# Patient Record
Sex: Female | Born: 1955
Health system: Southern US, Community
[De-identification: ages and names within clinical notes are randomized; demographics above are authoritative.]

## PROBLEM LIST (undated history)

## (undated) DIAGNOSIS — C229 Malignant neoplasm of liver, not specified as primary or secondary: Secondary | ICD-10-CM

## (undated) DIAGNOSIS — E119 Type 2 diabetes mellitus without complications: Secondary | ICD-10-CM

## (undated) DIAGNOSIS — I10 Essential (primary) hypertension: Secondary | ICD-10-CM

## (undated) DIAGNOSIS — K2210 Ulcer of esophagus without bleeding: Secondary | ICD-10-CM

## (undated) DIAGNOSIS — C23 Malignant neoplasm of gallbladder: Secondary | ICD-10-CM

## (undated) DIAGNOSIS — I2510 Atherosclerotic heart disease of native coronary artery without angina pectoris: Secondary | ICD-10-CM

## (undated) DIAGNOSIS — K269 Duodenal ulcer, unspecified as acute or chronic, without hemorrhage or perforation: Secondary | ICD-10-CM

## (undated) DIAGNOSIS — E785 Hyperlipidemia, unspecified: Secondary | ICD-10-CM

## (undated) DIAGNOSIS — D219 Benign neoplasm of connective and other soft tissue, unspecified: Secondary | ICD-10-CM

## (undated) HISTORY — DX: Atherosclerotic heart disease of native coronary artery without angina pectoris: I25.10

## (undated) HISTORY — DX: Essential (primary) hypertension: I10

## (undated) HISTORY — PX: MOUTH SURGERY: SHX715

## (undated) HISTORY — DX: Benign neoplasm of connective and other soft tissue, unspecified: D21.9

## (undated) HISTORY — DX: Hyperlipidemia, unspecified: E78.5

---

## 2000-07-23 ENCOUNTER — Encounter: Admission: RE | Admit: 2000-07-23 | Discharge: 2000-10-21 | Payer: Self-pay | Admitting: Internal Medicine

## 2000-07-27 ENCOUNTER — Encounter: Payer: Self-pay | Admitting: Emergency Medicine

## 2000-07-28 ENCOUNTER — Encounter: Payer: Self-pay | Admitting: Internal Medicine

## 2000-07-28 ENCOUNTER — Inpatient Hospital Stay (HOSPITAL_COMMUNITY): Admission: EM | Admit: 2000-07-28 | Discharge: 2000-08-01 | Payer: Self-pay | Admitting: Emergency Medicine

## 2000-07-30 ENCOUNTER — Encounter: Payer: Self-pay | Admitting: Internal Medicine

## 2001-07-18 ENCOUNTER — Encounter: Admission: RE | Admit: 2001-07-18 | Discharge: 2001-07-18 | Payer: Self-pay | Admitting: Internal Medicine

## 2001-07-18 ENCOUNTER — Encounter: Payer: Self-pay | Admitting: Internal Medicine

## 2001-09-10 ENCOUNTER — Ambulatory Visit (HOSPITAL_COMMUNITY): Admission: RE | Admit: 2001-09-10 | Discharge: 2001-09-10 | Payer: Self-pay | Admitting: Obstetrics and Gynecology

## 2001-09-10 ENCOUNTER — Encounter: Payer: Self-pay | Admitting: Obstetrics and Gynecology

## 2001-10-15 ENCOUNTER — Encounter: Payer: Self-pay | Admitting: Obstetrics and Gynecology

## 2001-10-15 ENCOUNTER — Ambulatory Visit (HOSPITAL_COMMUNITY): Admission: RE | Admit: 2001-10-15 | Discharge: 2001-10-15 | Payer: Self-pay | Admitting: Obstetrics and Gynecology

## 2003-03-24 ENCOUNTER — Other Ambulatory Visit: Admission: RE | Admit: 2003-03-24 | Discharge: 2003-03-24 | Payer: Self-pay | Admitting: Obstetrics and Gynecology

## 2003-03-31 ENCOUNTER — Ambulatory Visit (HOSPITAL_COMMUNITY): Admission: RE | Admit: 2003-03-31 | Discharge: 2003-03-31 | Payer: Self-pay | Admitting: Obstetrics and Gynecology

## 2003-03-31 ENCOUNTER — Encounter: Payer: Self-pay | Admitting: Obstetrics and Gynecology

## 2003-07-01 ENCOUNTER — Ambulatory Visit (HOSPITAL_COMMUNITY): Admission: RE | Admit: 2003-07-01 | Discharge: 2003-07-01 | Payer: Self-pay | Admitting: Obstetrics and Gynecology

## 2003-07-01 ENCOUNTER — Encounter: Payer: Self-pay | Admitting: Obstetrics and Gynecology

## 2004-04-13 ENCOUNTER — Ambulatory Visit (HOSPITAL_COMMUNITY): Admission: RE | Admit: 2004-04-13 | Discharge: 2004-04-13 | Payer: Self-pay | Admitting: Obstetrics and Gynecology

## 2004-05-05 ENCOUNTER — Other Ambulatory Visit: Admission: RE | Admit: 2004-05-05 | Discharge: 2004-05-05 | Payer: Self-pay | Admitting: Obstetrics and Gynecology

## 2004-12-14 ENCOUNTER — Ambulatory Visit (HOSPITAL_COMMUNITY): Admission: RE | Admit: 2004-12-14 | Discharge: 2004-12-14 | Payer: Self-pay | Admitting: Gastroenterology

## 2005-06-07 ENCOUNTER — Other Ambulatory Visit: Admission: RE | Admit: 2005-06-07 | Discharge: 2005-06-07 | Payer: Self-pay | Admitting: Obstetrics and Gynecology

## 2005-06-21 ENCOUNTER — Ambulatory Visit (HOSPITAL_COMMUNITY): Admission: RE | Admit: 2005-06-21 | Discharge: 2005-06-21 | Payer: Self-pay | Admitting: Obstetrics and Gynecology

## 2006-07-10 ENCOUNTER — Encounter: Admission: RE | Admit: 2006-07-10 | Discharge: 2006-07-10 | Payer: Self-pay | Admitting: Internal Medicine

## 2006-09-06 ENCOUNTER — Other Ambulatory Visit: Admission: RE | Admit: 2006-09-06 | Discharge: 2006-09-06 | Payer: Self-pay | Admitting: Obstetrics and Gynecology

## 2007-02-28 HISTORY — PX: CARDIOVASCULAR STRESS TEST: SHX262

## 2007-04-05 ENCOUNTER — Ambulatory Visit (HOSPITAL_COMMUNITY): Admission: RE | Admit: 2007-04-05 | Discharge: 2007-04-05 | Payer: Self-pay | Admitting: Cardiology

## 2007-04-05 HISTORY — PX: CARDIAC CATHETERIZATION: SHX172

## 2007-10-03 ENCOUNTER — Other Ambulatory Visit: Admission: RE | Admit: 2007-10-03 | Discharge: 2007-10-03 | Payer: Self-pay | Admitting: Obstetrics and Gynecology

## 2007-10-29 ENCOUNTER — Ambulatory Visit (HOSPITAL_COMMUNITY): Admission: RE | Admit: 2007-10-29 | Discharge: 2007-10-29 | Payer: Self-pay | Admitting: Obstetrics and Gynecology

## 2008-04-01 ENCOUNTER — Other Ambulatory Visit: Admission: RE | Admit: 2008-04-01 | Discharge: 2008-04-01 | Payer: Self-pay | Admitting: Obstetrics and Gynecology

## 2008-10-21 ENCOUNTER — Other Ambulatory Visit: Admission: RE | Admit: 2008-10-21 | Discharge: 2008-10-21 | Payer: Self-pay | Admitting: Obstetrics and Gynecology

## 2008-11-11 ENCOUNTER — Ambulatory Visit (HOSPITAL_COMMUNITY): Admission: RE | Admit: 2008-11-11 | Discharge: 2008-11-11 | Payer: Self-pay | Admitting: Obstetrics and Gynecology

## 2009-04-21 ENCOUNTER — Other Ambulatory Visit: Admission: RE | Admit: 2009-04-21 | Discharge: 2009-04-21 | Payer: Self-pay | Admitting: Obstetrics and Gynecology

## 2010-07-27 ENCOUNTER — Encounter: Admission: RE | Admit: 2010-07-27 | Discharge: 2010-07-27 | Payer: Self-pay | Admitting: Internal Medicine

## 2010-10-02 ENCOUNTER — Encounter: Payer: Self-pay | Admitting: Obstetrics and Gynecology

## 2011-01-24 NOTE — Cardiovascular Report (Signed)
[name], [name] NO.:  [id number]   MEDICAL RECORD NO.:  [id number]          PATIENT TYPE:  OIB   LOCATION:  2899                         FACILITY:  [facility]   PHYSICIAN:  Peter M. Swaziland, M.D.  DATE OF BIRTH:  [birth year]   DATE OF PROCEDURE:  04/05/2007  DATE OF DISCHARGE:  04/05/2007                            CARDIAC CATHETERIZATION   INDICATIONS FOR PROCEDURE:  A patient is a 55 year old black female with  history of diabetes, hypercholesterolemia, remote tobacco use who had  prior cardiac catheterization in 2001.  She had a recent Adenosine  Cardiolite study which showed new area of ischemia in the distal  anterolateral wall.  Cardiac catheterization was recommended.   PROCEDURE:  1. Left heart catheterization.  2. Coronary and left ventricular angiography.   EQUIPMENT:  A 6-French 4-cm right and left Judkins catheter, 6-French  pigtail catheter, 6-French arterial sheath.   ACCESS:  Via the right femoral artery using standard Seldinger  technique.   MEDICATIONS:  Local anesthesia with 1% Xylocaine.   CONTRAST:  Omnipaque 120 mL.   HEMODYNAMIC DATA:  Aortic pressure 117/68 and 6 mmHg.  Left ventricular  pressure is 117/63 and 14 mmHg.   ANGIOGRAPHIC DATA:  The left coronary artery rises and distributes  normally.  The left main coronary artery is without significant disease.   The left anterior descending artery is a large vessel that wraps around  the apex.  There is mild disease of the proximal vessel of 30%.  The LAD  is diffusely diseased at the distal vessel up to 40% to 50%.  Diagonal  branch is relatively small without significant disease.   The left circumflex coronary gives rise to three marginal vessels.  The  first marginal vessel is small.  It has proximal 50% to 60% stenosis  __________.   The second obtuse marginal vessel has severe stenosis distally of 90%.   Third marginal vessel __________ 40% narrowing proximally.  The left  circumflex proper has scattered irregularities __________.   The right coronary artery arises and distributes normally.  There is an  eccentric 70% to 80% stenosis in the proximal right coronary artery.  There is a 40% stenosis at the mid right coronary.   Left ventricular angiography was performed in the RAO view.  This  demonstrates normal left ventricular size and hyperdynamic  contractility, although ejection fraction is 75%.  There is no  significant mitral insufficiency.   FINAL INTERPRETATION:  1. Moderate 3-vessel coronary artery disease.  On comparison to prior      studies in 2001, there is progression of disease to the distal      second obtuse marginal vessel, mild progression of disease of the      proximal right coronary artery.  However, there has also been      significant regression of disease in proximal left anterior      descending and in the mid left circumflex coronary artery as well      as the proximal first obtuse marginal vessel.  I feel that her      Cardiolite findings are consistent with progressive disease  in the      distal second obtuse marginal vessel.  Small vessels at this point      are really not suitable for __________.  2. Normal left ventricular function.   PLAN:  Based on these findings, I would recommend medical therapy.  I  have encouraged that the patient does have significant __________ of  disease in the proximal LAD and circumflex.  Since she is asymptomatic  __________ the right coronary territory.  I would not favor __________.           ______________________________  Peter M. Swaziland, M.D.     PMJ/MEDQ  D:  04/05/2007  T:  04/05/2007  Job:  [id number]   cc:   Dr. Thedore Mins

## 2011-01-24 NOTE — H&P (Signed)
Julie Barker, Julie Barker NO.:  [id number]   MEDICAL RECORD NO.:  [id number]           PATIENT TYPE:   LOCATION:                                 FACILITY:   PHYSICIAN:  Peter M. Swaziland, M.D.  DATE OF BIRTH:  04/24/1956   DATE OF ADMISSION:  04/05/2007  DATE OF DISCHARGE:                              HISTORY & PHYSICAL   HISTORY OF PRESENT ILLNESS:  Julie Barker is a 55 year old black female  who is seen for evaluation of coronary artery disease.  She had  initially been evaluated in 2001.  At that time she had newly diagnosed  diabetes, hypertension, hypercholesterolemia.  She underwent cardiac  catheterization which demonstrated diffuse three-vessel coronary  disease.  She had a 70% stenosis in the proximal LAD and then diffuse  disease in the mid LAD up to 40-50%.  She also had an 80% stenosis in  the first marginal vessel and the entire marginal vessel was severely  diffusely diseased.  She had a 50% stenosis in the mid circumflex and a  50% stenosis in the third marginal vessel.  The right coronary artery  had 40-50% disease as well.  The patient was treated medically and had a  stress test 1 month later.  She had no chest pain at that time, she did  have some ECG changes but her Cardiolite images showed no significant  ischemia.  Will recommend continued aggressive medical therapy.  She  currently denies any symptoms of chest pain, shortness of breath or  palpitations.  However, we recently did a followup stress Cardiolite  study.  She was able to exercise for 7 minutes and 20 seconds on the  Bruce protocol, she had no chest pain but did have fatigue.  She had  PVCs and couplets but no significant ST segment changes.  Her Cardiolite  images demonstrated ischemia in the distal anterolateral territory, she  had normal LV function with ejection fraction 53%.  Given this new  anterolateral defect, it was recommended she undergo cardiac  catheterization.   PAST  MEDICAL HISTORY:  1. Diabetes mellitus type 2.  2. Hypertension.  3. Hypercholesterolemia.  4. History of fibroid tumors.   PRIOR SURGERIES:  None.   ALLERGIES:  She has no known allergies.   CURRENT MEDICATIONS:  1. Atenolol 50 mg b.i.d.  2. Altace 10 mg b.i.d.  3. Glucophage XR 500 mg two tablets b.i.d.  4. Imdur 60 mg per day.  5. Aspirin 81 mg per day.  6. Calcium 120 mg daily.  7. HCTZ 25 mg per day.  8. Zocor 80 mg per day.  9. Avandia 8 mg per day.  10.Byetta 10 mg daily.  11.Iron daily.  12.Centrum daily.  13.Fish Oil capsule daily.   SOCIAL HISTORY:  1. The patient is a housewife.  2. She is married.  3. She has a 32 year-old daughter.  4. She denies any tobacco or alcohol use.   FAMILY HISTORY:  1. Father died at age 66 with myocardial infarction and stroke.  2. Mother died at age 69 with an aneurysm.  3. Three brothers have had  hypertension.  4. Three sisters have hypertension and one has had a history of      rheumatic fever.  5. Two sisters also have diabetes.   REVIEW OF SYSTEMS:  Is otherwise unremarkable.   PHYSICAL EXAM:  The patient is a pleasant black female in no apparent  distress.  Weight is 229, blood pressure is 142/88, pulse is 72 and  regular.  HEENT EXAM:  Unremarkable.  She has no JVD, adenopathy or bruits.  LUNGS:  Clear.  CARDIAC EXAM:  Without gallop, murmur, rub or click.  ABDOMEN:  Soft, obese and nontender.  She  has no masses or  hepatosplenomegaly.  Femoral and pedal pulses are 2+ and symmetric.  NEUROLOGIC EXAM:  Nonfocal.   LABORATORY DATA:  Resting ECG shows normal sinus rhythm, nonspecific ST  abnormality.  Chest x-ray shows no active disease.  Glucose is 127, BUN  19, creatinine 1.1, sodium 140, potassium 4.7, chloride 101, CO2 28,  calcium 9.8.  A CBC is normal, coags are normal.   IMPRESSION:  1. Known history of coronary artery disease with recent Cardiolite      study showing a new area of ischemia in the distal  anterolateral      wall.  2. Hypertension.  3. Diabetes mellitus type 2.  4. Dyslipidemia.  5. Obesity.   PLAN:  Will proceed with diagnostic cardiac catheterization with further  therapy if indicated.           ______________________________  Peter M. [place]aziland, M.D.     PMJ/MEDQ  D:  04/04/2007  T:  04/04/2007  Job:  [id number]   cc:   Georgann Housekeeper, MD

## 2011-01-27 NOTE — Discharge Summary (Signed)
[place]. [facility]  Patient:    Julie Barker, Julie Barker                  MRN: [id number] Adm. Date:  [id number] Disc. Date: [id number] Attending:  Georgann Housekeeper                           Discharge Summary  DISCHARGE DIAGNOSES: 1. Chest pain, ruled out for myocardial infarction. 2. Atherosclerotic coronary vascular disease by catheterization. 3. Urinary tract infection. 4. Anemia. 5. Type 2 diabetes.  HOSPITAL COURSE:  The patient is a 55 year old African-American female admitted with a past medical history of recently diagnosed diabetes one month ago, history of hypertension with LVH by echocardiogram and former tobacco abuse, admitted with an episode of chest pain while she was at work with some atypical symptoms.  She also had some shortness of breath associated with that.  Came to the emergency room with nitroglycerin relieving her discomfort. The patient was admitted for telemetry.  Her enzymes and troponin enzymes were negative.  Her other significant labs on admission was a hemoglobin of 10, as an outpatient, last hemoglobin of 12.  ECG did show some J point elevation in V2 and V3 and nonspecific T wave changes in V6.  The patient was started on heparin and nitroglycerin, continued on beta blocker and her ACE inhibitor. Cardiology was consulted the following day and because of her history and ECG change with J point elevation, she was decided to have a catheterization the following day.  The patient remained without any symptoms of chest pain and on the second hospital day the patient spiked a temperature of 103 and she had a clean chest x-ray, but had urine which grew out E. coli and her white count elevated, and was thought to be fever ______ mild UTI urosepsis.  The patient was started on Tequin IV.  Her white count dropped from 16 to 12,000.  She responded well, did not have any more temperature.  The patient was transferred to the intensive care  for close observation.  She had blood cultures done which remained negative.  Because of her atypical chest pain symptoms and the fever, cardiology decided to have an ultrasound of the abdomen to evaluate for gallbladder disease.  The patient underwent an ultrasound of the gallbladder which showed chololithiasis with multiple gallstones without any abdominal gallbladder wall and no fluid.  She underwent a PIPIDA scan to rule out the acute cholecystitis.  The PIPIDA scan was negative.  She underwent a cardiac catheterization on the following day, on the 20th, which showed the diffuse coronary arteriosclerotic disease without any focal critical disease.  She had 50% eccentric LAD and proximal, and had OM-1 at 70-80% diffuse disease, 40% in circumflex at OM-2, and ostial was 40% ostial and proximal disease.  EF normal 65-70% with no wall motion abnormalities.  It was ______ that she was have an aggressive medical management to modify the risk factors and also get a stress Cardiolite outpatient to evaluate her by cardiology and patient was started on Imdur along with her beta blocker and ACE inhibitor and continued on aspirin, as far as a cardiac issue.  Anemia, the patient had hemoglobin of 10.  Anemia:  Hemoglobin of 10 on admission.  No evidence of any GI bleed.  She was rectally guaiac-negative and patients hemoglobin remained between 9.6 and 10. Hematology was consulted.  As far as the lab work, she had  a low iron study with normal B12 and folate and normal thyroid and SPEP was done which results were pending.  The smear showed a peripheral ______, was thought to be possibly allergic to medications.  She was started on Actos about two weeks ago and at the time of discharge Actos was discontinued and she was put on just some Glucotrol increased from 10 mg from 5 mg q.d. for diabetes.  Her blood sugars remained under control and she will have an outpatient followup with hematology for her  anemia.  PROCEDURE:  Cardiac catheterization  CONSULTATIONS:  Cardiology and hematology.  DISCHARGE MEDICATIONS: 1. Atenolol 100 mg p.o. q.d. 2. Altace 5 mg p.o. q.d. 3. Imdur 60 mg p.o. q.d. 4. Glucotrol 10 mg q.d. 5. Tequin 400 mg q.d. 6. Aspirin 325 mg q.d.  FOLLOW-UP:  Follow-up with cardiology in two to three weeks, with me in one to two weeks and she will also have a followup with hematology with calling for the appointment.  CONDITION ON DISCHARGE:  Stable.  DIET:  Cardiac and diabetic diet. DD:  08/03/00 TD:  08/04/00 Job: [id number]

## 2011-01-27 NOTE — Cardiovascular Report (Signed)
[place]. [facility]  Patient:    Julie Barker, Julie Barker                  MRN: [id number] Proc. Date: 07/31/00 Adm. Date:  [id number] Attending:  Georgann Housekeeper CC:         Georgann Housekeeper, M.D.  Genene Churn. Cyndie Chime, M.D.   Cardiac Catheterization  INDICATIONS:  The patient is a 55 year old black female with history of diabetes, hypertension, and tobacco abuse who presents with symptoms consistent with unstable angina.  ACCESS:  Via the right femoral artery using standard Seldinger technique.  EQUIPMENT:  6 French 4 cm right and left Judkins catheters, 6 French pigtail catheter, 6 French arterial sheath.  MEDICATIONS:  Local anesthesia with 1% Xylocaine, nitroglycerin 200 mcg intracoronary x 1.  CONTRAST:  160 cc of Omnipaque.  HEMODYNAMIC DATA:  Aortic pressure 133/75 with a mean of 94, left ventricular pressure 133 with EDP of 12 mmHg.  ANGIOGRAPHIC DATA:  The left coronary artery arises and distributes in a codominant fashion.  The left main coronary artery is without significant disease.  The left anterior descending artery wraps around the apex.  In the proximal vessel there is a 70% eccentric stenosis that appears focal.  The remainder of the LAD is diffusely diseased from the mid to distal vessel with diffuse 40 to 50% narrowing.  There are no focal areas of stenosis within this entire segment, but the entire vessel appears diffusely diseased.  The diagonal branches are very small in caliber and length and also appeared diffusely diseased.  The left circumflex coronary artery gives rise to three marginal vessels. The first marginal vessel is small in caliber with 80% stenosis proximally.  The entire marginal vessel is severely diffusely diseased with severe tapering all the way down to the distal vessel.  There is a 50% stenosis in the left circumflex at the bifurcation of the second obtuse marginal vessel.  This is a very large branch  which tapers distally with disease, but no focal stenoses seen.  The third obtuse marginal vessel is a smaller branch which has 50% narrowing proximally.  The right coronary artery arises and distributes normally.  In the ostium of the right coronary artery there is a 40% narrowing followed by focal 50% narrowing in the proximal right coronary artery.  With initial dye injection, there is severe catheter-induced spasm at the ostium of the vessel, but this improved significantly with intracoronary nitroglycerin.  Left ventricular angiography in the RAO view demonstrates normal left ventricular size and contractility with normal systolic function.  Ejection fraction is estimated at 70%.  There is no mitral regurgitation or prolapse.  FINAL INTERPRETATION: 1. Diffuse three vessel atherosclerotic coronary artery disease.  There are    no critical focal stenoses, but there is severe diffuse disease consistent    with diabetic arterial disease. 2. Normal left ventricular function.  PLAN:  In the absence of any critical stenoses at this time, would recommend aggressive medical therapy and risk factor modification with further risk stratification on medical therapy with stress testing. DD:  07/31/00 TD:  07/31/00 Job: [id number]

## 2011-01-27 NOTE — Consult Note (Signed)
[place]. [facility]  Patient:    Julie Barker, Julie Barker                  MRN: [id number] Proc. Date: 07/30/00 Adm. Date:  [id number] Attending:  Georgann Housekeeper Dictator:   Larey Dresser, N.P. CC:         Georgann Housekeeper, M.D.   Consultation Report  DATE OF BIRTH:  07/29/1956  REASON FOR CONSULTATION:  Anemia.  REFERRING PHYSICIAN:  Georgann Housekeeper, M.D.  HISTORY OF PRESENT ILLNESS:  Ms. Lupinacci is a 55 year old female with hypertension, recently diagnosed with type 2 diabetes, who presented to [facility] on July 27, 2000 with chest pain with negative cardiac enzymes.  She was noted to have a hemoglobin of 10.0; MCV 83.9; white blood cell count 9.4; and a platelet count of 392,000 on admission.  Since that time, her hemoglobin has ranged from 8.8 to 9.6 with a hemoglobin of 9.1 on July 30, 2000. Patient was scheduled to undergo cardiac catheterization, but this was placed on hold secondary to a recent temperature elevation.  Patient was found to have a urinary tract infection and was started on antibiotics.  On July 30, 2000, she underwent abdominal ultrasound, and as well, had a hepatobiliary scan.  Patient denies any rectal bleeding, melena, or hematuria.  States that she has not been anemic in the past, but was told to begin oral iron supplements approximately two years ago.  She reports that she is still menstruating and typically has two days of "heavy" flow.  Her periods have been irregular over the past two months.  She has had no pelvic/Pap or mammogram for over five years.  PAST MEDICAL HISTORY: 1. Type 2 diabetes, diagnosed approximately one month ago. 2. Hypertension.  CURRENT MEDICATIONS: 1. Aspirin 325 mg p.o. q.d. 2. Atenolol 100 mg p.o. q.d. 3. Tequin 400 mg p.o. q.d. 4. Protonix 40 mg p.o. q.d. 5. Actos 15 mg p.o. q.d. 6. Altace 5 mg p.o. q.d. 7. Heparin drip. 8. Nitroglycerin drip.  HOME MEDICATIONS:  Glucotrol,  Altace, Actos, atenolol.   The Glucotrol, Altace, and Actos have all been started within the past month.  ALLERGIES:  No known drug allergies.  FAMILY HISTORY:  Mother deceased with an aneurysm and father deceased with a CVA.  She reports that she has eight siblings and indicates that some have hypertension but are otherwise healthy.  She denies any family history of hematological disorder or malignancy.  SOCIAL HISTORY:  Patient lives in [place] with her spouse.  She has one daughter who is healthy.  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  She does report a positive tobacco history, stating that she quit one month ago at one pack per day for 25 years.  She reports occasional ETOH use.  She has never had a mammogram.  She reports her last Pap smear/pelvic exam was approximately five years ago.  Menstrual cycle lasts 3-4 days with first one to days heavy flow; periods have been erratic over the past few months.  REVIEW OF SYSTEMS:  Patient reports an approximate nine pound weight loss prior to her diagnosis of type 2 diabetes.  Her energy level was also decreased during that time.  She reports that her energy level has improved since beginning her diabetes medicines, but she is not back to her baseline. She denies any anorexia.  She has had no headaches.  She does report a recent vision change,  just prior to her diagnosis of diabetes, and has recently updated her eyeglass prescription.  She denies any neck pain or enlarged lymph nodes.  She has had no mouth sores.  She denies any shortness of breath or cough.  She reports chest pain with an onset July 27, 2000.  She denies any peripheral edema.  She has had no change in her bowel habits.  She denies any bright red blood per rectum.  She has had no melena.  She denies nausea, vomiting, or abdominal pain.  She denies any hematuria or dysuria.  She does report a UTI one month ago, for  which she took antibiotics.  She denies any breast pain, breast lumps, or nipple discharge.  She denies any numbness or tingling to her extremities.  She denies any bleeding.  She has no symptoms of collagen vascular disease.  PHYSICAL EXAMINATION:  VITAL SIGNS:  Temperature 97.9, heart rate 72, respirations 20, blood pressure 128/60.  GENERAL:  Well-nourished African-American female in no acute distress.  HEENT:  Normocephalic, atraumatic.  Pupils are equal, round and reactive to light; extraocular movements are intact.  Sclerae anicteric.  Oropharynx is clear.  LYMPH:  No cervical, supraclavicular, or axillary adenopathy.  LUNGS:  Clear anteriorly.  CARDIOVASCULAR:  Regular rate and rhythm.  ABDOMEN:  Soft.  Bowel sounds active throughout.  EXTREMITIES:  Without edema.  NEUROLOGIC:  Alert and oriented x 3; follows commands.  Moves all extremities to command.  Cranial nerves 2-12 are grossly intact.  LABORATORY DATA:  Hemoglobin 9.1; white count 11.6; platelets 359,000; MCV 85. Stools reported as heme negative.  Sodium 135, potassium 3.9, BUN 22, creatinine 1.2, glucose 121, total bilirubin 0.5, alkaline phosphatase 126, SGOT 26, SGPT 29, total protein 7.5, albumin 2.3, calcium 8.7.  Retic percentage 1.3, retic RBC 3.4, absolute reticulocyte count 44.2.  Ferratin 404.  Iron studies were completed on November 17 and November 18 and revealed a discrepancy; on November 17, the iron is reported as 63 and on November 18 the iron is reported as less than 10.  Serum folate 10.8.  UA and culture positive for E. coli.  Review of peripheral smear - normochromic, normocytic red cells.  Platelets are normal.  Neutrophils are without toxic granulation.  Lymphs are normal. Moderate eosinophilia.  No schistocytes.  Prior laboratory data, on July 02, 2000 at [facility] office - hemoglobin was 12.6, white count 15.7.  Platelets were 311,000.   IMPRESSION/PLAN:  Patient was seen and  examined by Dr. Cyndie Chime and peripheral blood film was reviewed.  Acute multifactorial anemia (note hemoglobin 12.6 just three weeks ago). Patient was started on a number of new medications at that time.  The eosinophilia on peripheral blood suggests a possible allergic reaction. Question Actos as the culprit, as it has anemia as a possible side effect. Intercurrent pyelonephritis may also be a factor.  There is no evidence for acute blood loss or hemolysis.  We will screen her for myeloma, but this clinically unlikely.  The remainder of her evaluation can be done as an outpatient.  Note that patient has not had a pelvic/Pap/mammogram in over five years. DD:  07/31/00 TD:  07/31/00 Job: [id number]

## 2011-01-27 NOTE — Cardiovascular Report (Signed)
Julie Barker, Julie Barker NO.:  [id number]   MEDICAL RECORD NO.:  [id number]          PATIENT TYPE:  OIB   LOCATION:  2899                         FACILITY:  [facility]   PHYSICIAN:  Peter M. Swaziland, M.D.  DATE OF BIRTH:  [birth year]   DATE OF PROCEDURE:  DATE OF DISCHARGE:  04/05/2007                            CARDIAC CATHETERIZATION   INDICATIONS FOR PROCEDURE:  A 55 year old black female with history of  diabetes, hypercholesterolemia, remote history of tobacco use.  Recent  adenosine Cardiolite study showed an area of ischemia in the distal  anterolateral wall.  Comparison was made with the prior cardiac  catheterization in 2001.   PROCEDURE:  Left heart catheterization, coronary and left ventricular  angiography.   EQUIPMENT USED:  A 6-French 4 cm right and left Judkins catheter, a 6-  French pigtail catheter, a 6-French arterial sheath.  Access was via the  right femoral artery using the standard Seldinger technique.   MEDICATIONS:  Local anesthesia 1% Xylocaine, contrast 120 mL of  Omnipaque.   HEMODYNAMIC DATA:  Aortic pressure was 117/68, left ventricular pressure  was 117 with EDP of 14 mmHg.   ANGIOGRAPHIC DATA:  Left coronary artery arises and distributes  normally.  The left main coronary artery is without significant disease.   The left anterior descending artery is a large vessel that wraps around  the apex.  There is mild disease in the proximal vessel up to 30%.  The  LAD is diffusely diseased throughout the mid and distal vessel up to 40-  50%.  There is a relatively small diagonal branch which is without  significant disease.   Left circumflex coronary artery gives rise to three marginal vessels.  First marginal vessel was small.  Has a 50% stenosis at its origin.   The second obtuse marginal vessel has severe distal stenosis up to 90%.   The third marginal vessel has a 40% narrowing proximally.  The left  circumflex coronary itself has only  scattered minor irregularities.   The right coronary artery arises and distributes normally.  It is a  dominant vessel.  It has an eccentric 70% to 80% stenosis in the  proximal vessel.  There is a 40% stenosis in the mid right coronary  artery.   Left ventricular angiography was performed in RAO view.  This  demonstrates normal left ventricular size with vigorous contractility  and ejection fraction is estimated at 75%.  There is no significant  mitral insufficiency.   FINAL INTERPRETATION:  1. Moderate three-vessel coronary artery disease.  Compared to 2001,      there has been progressive disease in the distal second obtuse      marginal vessel and mild progression of disease in the proximal      right coronary artery.  However, there has also been significant      regression of disease in the proximal LAD and in the mid left      circumflex coronary artery as well as the proximal first obtuse      marginal vessel.  It is felt that her Cardiolite findings were most  consistent with progressive disease in the distal second obtuse      marginal vessel.  This is a small vessel and is not suitable for      intervention.  2. Normal left ventricular function.   PLAN:  Based on these findings, I would recommend continued medical  therapy.  The patient has had significant regression of disease in the  proximal LAD and left circumflex artery.  She is asymptomatic and does  not have any significant ischemia in the right coronary territory.  Therefore, we will continue lifestyle modification and aggressive  management of her risk factors.  If she were to have significant anginal  symptoms, intervention could be considered for the proximal right  coronary stenosis.           ______________________________  Peter M. Swaziland, M.D.     PMJ/MEDQ  D:  04/16/2007  T:  04/16/2007  Job:  [id number]

## 2011-01-27 NOTE — H&P (Signed)
[facility]  Patient:    Julie Barker, Julie Barker                  MRN: [id number] Adm. Date:  [id number] Attending:  Georgann Housekeeper                         History and Physical  HISTORY OF PRESENT ILLNESS:  Ms. Farinas is a nice 55 year old black female, admitted with chest pain.  She has never had any chest discomfort in the past. Approximately at 11 a.m. today she developed some chest discomfort that lasted for 30-60 minutes.  She was a little dyspneic for about 15-20 minutes during that time.  She took two aspirin and felt better.  She had no further trouble until approximately 6 p.m.  She developed chest discomfort once again, this time also associated with shortness of breath.  She took two more aspirin about 8 p.m., but the discomfort worsened.  She came to the emergency room about 9 p.m.  She came to an examination room, and the discomfort slowly improved over about 30 minutes.  At the time of my arrival she still had 2/10 discomfort, and she was given nitroglycerin sublingual x 1, and the discomfort resolved.  PAST MEDICAL HISTORY: 1. Diabetes mellitus diagnosed in 2001, on Glucotrol XL 5 mg q.d.    and Actos 50 mg q.d. 2. Hypertension diagnosed about two years ago, on atenolol 100 mg q.d.    and Altace 5 mg q.d., apparently complicated by LVH by echocardiogram.    Her murmur with recent echocardiogram being "okay." 3. She is a former smoker (quit four weeks ago).  PAST SURGICAL HISTORY:  None.  ALLERGIES:  No known drug allergies.  CURRENT MEDICATIONS:  See above.  Also vitamin C, calcium, aspirin 81 mg q.d.  SOCIAL HISTORY:  Cigarettes, see above.  Alcohol:  Occasional.  She works for [employer].  FAMILY HISTORY:  Father died of a stroke at age 32.  Mother died of a cardiac aneurysm at age 44.  She has eight siblings, some of whom have hypertension. No other siblings have diabetes mellitus, and none have coronary  artery disease.  REVIEW OF SYSTEMS:  All other systems are negative.  PHYSICAL EXAMINATION:  GENERAL:  Well-developed, well-nourished, in no distress.  VITAL SIGNS:  Blood pressure 164/66, pulse 72, respirations 20.  HEENT:  Eyes:  Pupils equal, round, reactive to light.  Extraocular movements intact.  Funduscopic examination is normal.  Ears are normal.  Nose and throat unremarkable.  NECK:  Supple.  Thyroid is not enlarged or tender.  CHEST:  Clear to auscultation and percussion.  CARDIAC:  A regular rhythm and rate, with a normal S1, S2.  No S3 or S4, no rub or click.  There is a 2/6 systolic ejection murmur in the upper right and the upper left sternal borders.  ABDOMEN:  Soft, nontender, with normal bowel sounds, without hepatosplenomegaly or masses.  EXTREMITIES:  Without cyanosis, clubbing, or edema.  Electrocardiogram shows a normal sinus rhythm with ST elevation consistent with J-point elevation in V2 and V3.  T-wave changes minor in nature in V6 and I.  LABORATORY DATA:  Negative troponin and CPK.  Hemoglobin 10.0 with normal MCV and elevated RDW.  Electrolytes are normal.  Glucose 188.  IMPRESSION: 1. Chest pain in a patient with risk factors for coronary artery disease,    relieved with nitroglycerin.  PLAN:  The  patient will be admitted and a myocardial infarction will be ruled out on the basis of serial enzymes and electrocardiograms.  Will need an invasive, versus noninvasive evaluation.  Will treat her with aspirin, heparin, and IV nitroglycerin for now.  2. Diabetes mellitus.  PLAN:  Check CBGs.  Continue her usual medicines.  3. Hypertension with left ventricular hypertrophy.  PLAN:  Continue her usual medicines.  4. Heart murmur.  PLAN:  Check on echocardiogram results in the office.  5. Anemia.  PLAN:  Workup pending.DD:  07/28/00 TD:  07/28/00 Job: [id number]

## 2011-01-27 NOTE — Op Note (Signed)
[name], [name].:  [id number]   MEDICAL RECORD Julie.:  [id number]          PATIENT TYPE:  AMB   LOCATION:  ENDO                         FACILITY:  [facility]   PHYSICIAN:  Danise Edge, M.D.   DATE OF BIRTH:  [birth year]   DATE OF PROCEDURE:  12/14/2004  DATE OF DISCHARGE:                                 OPERATIVE REPORT   PROCEDURE:  Diagnostic colonoscopy.   INDICATIONS:  [name] is a 55 year old female born January 05, 1956. [name] is undergoing diagnostic colonoscopy to evaluate guaiac-  positive stool.   PAST MEDICAL PROBLEMS:  Type 2 diabetes mellitus, coronary artery disease,  hypertension, hyperlipidemia, uterine fibroids.   ENDOSCOPIST:  Danise Edge, M.D.   PREMEDICATION:  Versed 7 mg, Demerol 70 mg.   DESCRIPTION OF PROCEDURE:  After obtaining informed consent, [name] was  placed in the left lateral decubitus position. I administered intravenous  Demerol and intravenous Versed to achieve conscious sedation for the  procedure. The patient's blood pressure, oxygen saturation and cardiac  rhythm were monitored throughout the procedure and documented in the medical  record.   Anal inspection and digital rectal exam were normal. The Olympus adjustable  pediatric colonoscope was introduced into the rectum and advanced to the  cecum as identified by visualizing a normal appendiceal orifice and  ileocecal valve. Colonic preparation for the exam today was satisfactory.   RECTUM:  Normal.  SIGMOID COLON AND DESCENDING COLON:  Normal.  SPLENIC FLEXURE:  Normal.  TRANSVERSE COLON:  Normal.  HEPATIC FLEXURE:  Normal.  ASCENDING COLON:  Extensive colonic diverticulosis.  CECUM AND ILEOCECAL VALVE:  Normal.   ASSESSMENT:  Normal proctocolonoscopy to the cecum except for the presence  of ascending colon diverticulosis and moderate sized internal hemorrhoids.      MJ/MEDQ  D:  12/14/2004  T:  12/14/2004  Job:  [id number]   cc:    Georgann Housekeeper, MD  [address]  [place]  [place] [address]  Fax: [phone]

## 2011-03-22 ENCOUNTER — Other Ambulatory Visit: Payer: Self-pay | Admitting: Cardiology

## 2011-03-22 NOTE — Telephone Encounter (Signed)
DECLINE refill, not seen sine 07/07/2009.

## 2011-04-21 ENCOUNTER — Other Ambulatory Visit: Payer: Self-pay | Admitting: Cardiology

## 2011-04-21 NOTE — Telephone Encounter (Signed)
escribe medication per fax request  

## 2011-07-23 ENCOUNTER — Other Ambulatory Visit: Payer: Self-pay | Admitting: Cardiology

## 2011-07-28 ENCOUNTER — Encounter: Payer: Self-pay | Admitting: Nurse Practitioner

## 2011-08-02 ENCOUNTER — Ambulatory Visit (INDEPENDENT_AMBULATORY_CARE_PROVIDER_SITE_OTHER): Payer: 59 | Admitting: Nurse Practitioner

## 2011-08-02 ENCOUNTER — Encounter: Payer: Self-pay | Admitting: Nurse Practitioner

## 2011-08-02 VITALS — BP 158/104 | HR 89 | Ht 66.0 in | Wt 226.1 lb

## 2011-08-02 DIAGNOSIS — E785 Hyperlipidemia, unspecified: Secondary | ICD-10-CM | POA: Insufficient documentation

## 2011-08-02 DIAGNOSIS — I2510 Atherosclerotic heart disease of native coronary artery without angina pectoris: Secondary | ICD-10-CM

## 2011-08-02 DIAGNOSIS — I10 Essential (primary) hypertension: Secondary | ICD-10-CM

## 2011-08-02 MED ORDER — CARVEDILOL 25 MG PO TABS: 25.0000 mg | ORAL_TABLET | Freq: Two times a day (BID) | ORAL | Status: DC

## 2011-08-02 NOTE — Assessment & Plan Note (Signed)
She remains on statin therapy. Labs are checked by Dr. Donette Larry.

## 2011-08-02 NOTE — Patient Instructions (Signed)
I would encourage you to monitor your blood pressure at home.  I have refilled your Coreg.  Regular exercise is encouraged.  We will see you back in a year.  Call the [facility] office at [phone] if you have any questions, problems or concerns.

## 2011-08-02 NOTE — Assessment & Plan Note (Signed)
Has known CAD with diffuse distal vessel disease. Managed medically. Currently doing well. We will see her back in a year.

## 2011-08-02 NOTE — Progress Notes (Signed)
Julie Barker Date of Birth: 1956-05-04 Medical Record #[id number]  History of Present Illness: Julie Barker is seen today after an approximate 2 year absence. She is seen for Julie Barker. She has a known history of CAD and HTN. She is also diabetic and on insulin. She comes in today for medicines to be refilled. She has been doing ok. She has no complaint whatsoever. She says her blood pressure has been good with Julie Barker. She saw him last month. He does her labs. She took her morning medicines right before her appointment today. She has not really been checking it at home. She says she needs to get back on track with her diet/exercise and weight loss. No chest pain or shortness of breath. Feels good on her medicines.   Current Outpatient Prescriptions on File Prior to Visit  Medication Sig Dispense Refill  . aspirin 81 MG tablet Take 81 mg by mouth daily.        Marland Kitchen CALCIUM PO Take 1,000 mg by mouth daily.        . carvedilol (COREG) 25 MG tablet TAKE ONE TABLET BY MOUTH TWICE DAILY  30 tablet  0  . exenatide (BYETTA 10 MCG PEN) 10 MCG/0.04ML SOLN Inject 10 mcg into the skin 2 (two) times daily with a meal.        . fish oil-omega-3 fatty acids 1000 MG capsule Take 2 g by mouth daily.        . hydrochlorothiazide (HYDRODIURIL) 25 MG tablet Take 25 mg by mouth daily.        . insulin glargine (LANTUS) 100 UNIT/ML injection Inject into the skin at bedtime.        . IRON PO Take by mouth daily.        . isosorbide mononitrate (IMDUR) 60 MG 24 hr tablet Take 60 mg by mouth daily.        . metFORMIN (GLUCOPHAGE) 500 MG tablet Take 1,000 mg by mouth 2 (two) times daily with a meal.        . multivitamin-iron-minerals-folic acid (CENTRUM) chewable tablet Chew 1 tablet by mouth daily.        . ramipril (ALTACE) 10 MG capsule Take 10 mg by mouth 2 (two) times daily.        . simvastatin (ZOCOR) 80 MG tablet Take 80 mg by mouth at bedtime.          No Known Allergies  Past Medical History    Diagnosis Date  . Hypertension   . Hyperlipidemia   . Diabetes mellitus   . Coronary artery disease     has diffuse distal vessel disease. Managed medically  . Fibroid tumor     Past Surgical History  Procedure Date  . Cardiac catheterization 04/05/2007    EF 75%.MODERATE 3 VESSEL CAD  . Cardiovascular stress test 02/28/2007    EF 53%. NO ISCHEMIA    History  Smoking status  . Former Smoker  . Quit date: 09/11/1998  Smokeless tobacco  . Not on file    History  Alcohol Use No    Family History  Problem Relation Age of Onset  . Aneurysm Mother   . Heart attack Father   . Stroke Father   . Hypertension Sister   . Diabetes Sister   . Rheumatic fever Sister   . Hypertension Brother   . Hypertension Brother   . Hypertension Brother   . Hypertension Sister   . Diabetes Sister  Review of Systems: The review of systems is per the HPI.  All other systems were reviewed and are negative.  Physical Exam: Pulse 89  Ht 5\' 6"  (1.676 m)  Wt 226 lb 1.9 oz (102.567 kg)  BMI 36.50 kg/m2 Patient is very pleasant and in no acute distress. Skin is warm and dry. Color is normal.  HEENT is unremarkable. Normocephalic/atraumatic. PERRL. Sclera are nonicteric. Neck is supple. No masses. No JVD. Lungs are clear. Cardiac exam shows a regular rate and rhythm. Abdomen is soft. Extremities are without edema. Gait and ROM are intact. No gross neurologic deficits noted.   LABORATORY DATA: EKG shows sinus rhythm with 1st degree AV block. Nonspecific T wave changes.    Assessment / Plan:

## 2011-08-02 NOTE — Assessment & Plan Note (Signed)
Blood pressure was up today. She had just taken her medicines prior to this appointment. She says she has had good control with Dr. Donette Larry. I have encouraged her to get back to monitoring at home. She is also encouraged in regards to diet/weight loss/exercise. Coreg is refilled today. We will tentatively see her back in one year. Patient is agreeable to this plan and will call if any problems develop in the interim.

## 2012-05-23 ENCOUNTER — Other Ambulatory Visit: Payer: Self-pay | Admitting: Nurse Practitioner

## 2012-07-22 ENCOUNTER — Other Ambulatory Visit: Payer: Self-pay | Admitting: Nurse Practitioner

## 2012-09-25 ENCOUNTER — Other Ambulatory Visit: Payer: Self-pay | Admitting: Internal Medicine

## 2012-09-25 DIAGNOSIS — Z1231 Encounter for screening mammogram for malignant neoplasm of breast: Secondary | ICD-10-CM

## 2012-09-27 ENCOUNTER — Other Ambulatory Visit: Payer: Self-pay | Admitting: Internal Medicine

## 2012-09-27 DIAGNOSIS — R748 Abnormal levels of other serum enzymes: Secondary | ICD-10-CM

## 2012-10-02 ENCOUNTER — Other Ambulatory Visit: Payer: 59

## 2012-10-09 ENCOUNTER — Ambulatory Visit
Admission: RE | Admit: 2012-10-09 | Discharge: 2012-10-09 | Disposition: A | Discharge status: Home / Self Care | Payer: 59 | Source: Ambulatory Visit | Attending: Internal Medicine | Admitting: Internal Medicine

## 2012-10-09 DIAGNOSIS — R748 Abnormal levels of other serum enzymes: Secondary | ICD-10-CM

## 2012-10-30 ENCOUNTER — Ambulatory Visit
Admission: RE | Admit: 2012-10-30 | Discharge: 2012-10-30 | Disposition: A | Discharge status: Home / Self Care | Payer: 59 | Source: Ambulatory Visit | Attending: Internal Medicine | Admitting: Internal Medicine

## 2012-10-30 DIAGNOSIS — Z1231 Encounter for screening mammogram for malignant neoplasm of breast: Secondary | ICD-10-CM

## 2012-11-01 ENCOUNTER — Encounter: Payer: Self-pay | Admitting: Cardiology

## 2012-11-06 ENCOUNTER — Other Ambulatory Visit: Payer: Self-pay | Admitting: Obstetrics and Gynecology

## 2012-11-06 ENCOUNTER — Other Ambulatory Visit (HOSPITAL_COMMUNITY)
Admission: RE | Admit: 2012-11-06 | Discharge: 2012-11-06 | Disposition: A | Discharge status: Home / Self Care | Payer: 59 | Source: Ambulatory Visit | Attending: Obstetrics and Gynecology | Admitting: Obstetrics and Gynecology

## 2012-11-06 DIAGNOSIS — N760 Acute vaginitis: Secondary | ICD-10-CM | POA: Insufficient documentation

## 2012-11-06 DIAGNOSIS — Z01419 Encounter for gynecological examination (general) (routine) without abnormal findings: Secondary | ICD-10-CM | POA: Insufficient documentation

## 2012-11-06 DIAGNOSIS — Z1151 Encounter for screening for human papillomavirus (HPV): Secondary | ICD-10-CM | POA: Insufficient documentation

## 2013-06-04 ENCOUNTER — Other Ambulatory Visit: Payer: Self-pay | Admitting: Internal Medicine

## 2013-06-04 DIAGNOSIS — N6002 Solitary cyst of left breast: Secondary | ICD-10-CM

## 2013-06-18 ENCOUNTER — Ambulatory Visit
Admission: RE | Admit: 2013-06-18 | Discharge: 2013-06-18 | Disposition: A | Discharge status: Home / Self Care | Payer: 59 | Source: Ambulatory Visit | Attending: Internal Medicine | Admitting: Internal Medicine

## 2013-06-18 DIAGNOSIS — N6002 Solitary cyst of left breast: Secondary | ICD-10-CM

## 2013-09-24 ENCOUNTER — Ambulatory Visit: Payer: 59 | Admitting: *Deleted

## 2013-11-12 ENCOUNTER — Other Ambulatory Visit (HOSPITAL_COMMUNITY)
Admission: RE | Admit: 2013-11-12 | Discharge: 2013-11-12 | Disposition: A | Discharge status: Home / Self Care | Payer: 59 | Source: Ambulatory Visit | Attending: Obstetrics and Gynecology | Admitting: Obstetrics and Gynecology

## 2013-11-12 ENCOUNTER — Other Ambulatory Visit: Payer: Self-pay | Admitting: Obstetrics and Gynecology

## 2013-11-12 DIAGNOSIS — Z1151 Encounter for screening for human papillomavirus (HPV): Secondary | ICD-10-CM | POA: Insufficient documentation

## 2013-11-12 DIAGNOSIS — Z124 Encounter for screening for malignant neoplasm of cervix: Secondary | ICD-10-CM | POA: Insufficient documentation

## 2015-02-10 ENCOUNTER — Other Ambulatory Visit: Payer: Self-pay | Admitting: Gastroenterology

## 2015-05-14 ENCOUNTER — Encounter (HOSPITAL_COMMUNITY): Payer: Self-pay | Admitting: *Deleted

## 2015-05-31 ENCOUNTER — Ambulatory Visit (HOSPITAL_COMMUNITY): Payer: Commercial Managed Care - HMO | Admitting: Certified Registered Nurse Anesthetist

## 2015-05-31 ENCOUNTER — Encounter (HOSPITAL_COMMUNITY): Payer: Self-pay | Admitting: *Deleted

## 2015-05-31 ENCOUNTER — Ambulatory Visit (HOSPITAL_COMMUNITY)
Admission: RE | Admit: 2015-05-31 | Discharge: 2015-05-31 | Disposition: A | Discharge status: Home / Self Care | Payer: Commercial Managed Care - HMO | Source: Ambulatory Visit | Attending: Gastroenterology | Admitting: Gastroenterology

## 2015-05-31 ENCOUNTER — Encounter (HOSPITAL_COMMUNITY)
Admission: RE | Disposition: A | Discharge status: Home / Self Care | Payer: Self-pay | Source: Ambulatory Visit | Attending: Gastroenterology

## 2015-05-31 DIAGNOSIS — I10 Essential (primary) hypertension: Secondary | ICD-10-CM | POA: Diagnosis not present

## 2015-05-31 DIAGNOSIS — Z87891 Personal history of nicotine dependence: Secondary | ICD-10-CM | POA: Insufficient documentation

## 2015-05-31 DIAGNOSIS — M109 Gout, unspecified: Secondary | ICD-10-CM | POA: Insufficient documentation

## 2015-05-31 DIAGNOSIS — K5730 Diverticulosis of large intestine without perforation or abscess without bleeding: Secondary | ICD-10-CM | POA: Diagnosis not present

## 2015-05-31 DIAGNOSIS — Z794 Long term (current) use of insulin: Secondary | ICD-10-CM | POA: Insufficient documentation

## 2015-05-31 DIAGNOSIS — Z1211 Encounter for screening for malignant neoplasm of colon: Secondary | ICD-10-CM | POA: Diagnosis present

## 2015-05-31 DIAGNOSIS — E780 Pure hypercholesterolemia: Secondary | ICD-10-CM | POA: Insufficient documentation

## 2015-05-31 DIAGNOSIS — E119 Type 2 diabetes mellitus without complications: Secondary | ICD-10-CM | POA: Insufficient documentation

## 2015-05-31 DIAGNOSIS — Z79899 Other long term (current) drug therapy: Secondary | ICD-10-CM | POA: Insufficient documentation

## 2015-05-31 DIAGNOSIS — I2510 Atherosclerotic heart disease of native coronary artery without angina pectoris: Secondary | ICD-10-CM | POA: Diagnosis not present

## 2015-05-31 HISTORY — PX: COLONOSCOPY WITH PROPOFOL: SHX5780

## 2015-05-31 LAB — GLUCOSE, CAPILLARY: GLUCOSE-CAPILLARY: 143 mg/dL — AB (ref 65–99)

## 2015-05-31 SURGERY — COLONOSCOPY WITH PROPOFOL
Anesthesia: Monitor Anesthesia Care

## 2015-05-31 MED ORDER — SODIUM CHLORIDE 0.9 % IV SOLN: INTRAVENOUS | Status: DC

## 2015-05-31 MED ORDER — LIDOCAINE HCL (CARDIAC) 20 MG/ML IV SOLN
INTRAVENOUS | Status: AC
  Filled 2015-05-31: qty 5

## 2015-05-31 MED ORDER — PROPOFOL 10 MG/ML IV BOLUS
INTRAVENOUS | Status: DC | PRN
  Administered 2015-05-31: 20 mg via INTRAVENOUS
  Administered 2015-05-31: 30 mg via INTRAVENOUS
  Administered 2015-05-31: 100 mg via INTRAVENOUS
  Administered 2015-05-31: 20 mg via INTRAVENOUS
  Administered 2015-05-31: 30 mg via INTRAVENOUS

## 2015-05-31 MED ORDER — PROPOFOL 10 MG/ML IV BOLUS
INTRAVENOUS | Status: AC
  Filled 2015-05-31: qty 20

## 2015-05-31 MED ORDER — LIDOCAINE HCL (CARDIAC) 20 MG/ML IV SOLN
INTRAVENOUS | Status: DC | PRN
  Administered 2015-05-31: 50 mg via INTRAVENOUS

## 2015-05-31 MED ORDER — LACTATED RINGERS IV SOLN
INTRAVENOUS | Status: DC
  Administered 2015-05-31: 09:00:00 via INTRAVENOUS

## 2015-05-31 SURGICAL SUPPLY — 22 items

## 2015-05-31 NOTE — Discharge Instructions (Signed)

## 2015-05-31 NOTE — Op Note (Signed)
Procedure: Screening colonoscopy. Normal screening colonoscopy performed on 12/14/2004  Endoscopist: Earle Gell  Premedication: Propofol administered by anesthesia  Procedure: The patient was placed in the left lateral decubitus position. Anal inspection and digital rectal exam were normal. The Pentax pediatric colonoscope was introduced into the rectum and advanced to the cecum. A normal-appearing appendiceal orifice and ileocecal valve were identified. Colonic preparation for the exam today was good. Withdrawal time was 10 minutes  Universal colonic diverticulosis was present  Rectum. Normal. I was unable to retroflex the colonoscope in the rectum.  Sigmoid colon and descending colon. Normal  Splenic flexure. Normal  Transverse colon. Normal  Hepatic flexure. Normal  Ascending colon. Normal  Cecum and ileocecal valve. Normal  Assessment: Normal screening colonoscopy  Recommendation: Schedule repeat screening colonoscopy in 10 years

## 2015-05-31 NOTE — Anesthesia Postprocedure Evaluation (Signed)
  Anesthesia Post-op Note  Patient: Julie Barker  Procedure(s) Performed: Procedure(s) (LRB): COLONOSCOPY WITH PROPOFOL (N/A)  Patient Location: PACU  Anesthesia Type: MAC  Level of Consciousness: awake and alert   Airway and Oxygen Therapy: Patient Spontanous Breathing  Post-op Pain: mild  Post-op Assessment: Post-op Vital signs reviewed, Patient's Cardiovascular Status Stable, Respiratory Function Stable, Patent Airway and No signs of Nausea or vomiting  Last Vitals:  Filed Vitals:   05/31/15 1134  BP: 146/83  Pulse:   Temp:   Resp: 21    Post-op Vital Signs: stable   Complications: No apparent anesthesia complications

## 2015-05-31 NOTE — H&P (Signed)
  Procedure: Screening colonoscopy. Normal screening colonoscopy performed on 12/14/2004  History: The patient is a 59 year old female born [birth year]. She is scheduled to undergo a repeat screening colonoscopy today.  Medication allergies: None  Past medical history: Coronary artery disease. Hypertension. Hypercholesterolemia. Type 2 diabetes mellitus associated with elevated BMI. Asymptomatic gallstones. Fatty appearing liver by ultrasound in 2014. Uterine fibroid. Mild hypercalcemia with normal PTH. Gout.  Exam: The patient is alert and lying comfortably on the endoscopy stretcher. Abdomen is soft and nontender to palpation. Lungs are clear to auscultation. Cardiac exam reveals a regular rhythm.  Plan: Proceed with screening colonoscopy

## 2015-05-31 NOTE — Transfer of Care (Signed)
Immediate Anesthesia Transfer of Care Note  Patient: Julie Barker  Procedure(s) Performed: Procedure(s): COLONOSCOPY WITH PROPOFOL (N/A)  Patient Location: PACU  Anesthesia Type:MAC  Level of Consciousness: awake, alert  and oriented  Airway & Oxygen Therapy: Patient Spontanous Breathing and Patient connected to face mask oxygen  Post-op Assessment: Report given to RN and Post -op Vital signs reviewed and stable  Post vital signs: Reviewed and stable  Last Vitals:  Filed Vitals:   05/31/15 0910  BP: 196/91  Pulse: 74  Temp: 36.9 C  Resp: 12    Complications: No apparent anesthesia complications

## 2015-05-31 NOTE — Anesthesia Preprocedure Evaluation (Addendum)
Anesthesia Evaluation  Patient identified by MRN, date of birth, ID band Patient awake    Reviewed: Allergy & Precautions, H&P , NPO status , Patient's Chart, lab work & pertinent test results, reviewed documented beta blocker date and time   Airway Mallampati: II  TM Distance: >3 FB Neck ROM: full    Dental  (+) Dental Advisory Given, Edentulous Upper   Pulmonary neg pulmonary ROS, former smoker,    Pulmonary exam normal breath sounds clear to auscultation       Cardiovascular hypertension, Pt. on home beta blockers and Pt. on medications + CAD  Normal cardiovascular exam Rhythm:regular Rate:Normal  Diffuse distal vessel disease with medical management   Neuro/Psych negative neurological ROS  negative psych ROS   GI/Hepatic negative GI ROS, Neg liver ROS,   Endo/Other  diabetes, Well Controlled, Type 2, Insulin Dependent, Oral Hypoglycemic Agents  Renal/GU negative Renal ROS  negative genitourinary   Musculoskeletal   Abdominal   Peds  Hematology negative hematology ROS (+)   Anesthesia Other Findings   Reproductive/Obstetrics negative OB ROS                            Anesthesia Physical Anesthesia Plan  ASA: III  Anesthesia Plan: MAC   Post-op Pain Management:    Induction:   Airway Management Planned:   Additional Equipment:   Intra-op Plan:   Post-operative Plan:   Informed Consent: I have reviewed the patients History and Physical, chart, labs and discussed the procedure including the risks, benefits and alternatives for the proposed anesthesia with the patient or authorized representative who has indicated his/her understanding and acceptance.   Dental Advisory Given  Plan Discussed with: CRNA and Surgeon  Anesthesia Plan Comments:         Anesthesia Quick Evaluation

## 2015-06-01 ENCOUNTER — Encounter (HOSPITAL_COMMUNITY): Payer: Self-pay | Admitting: Gastroenterology

## 2015-06-14 ENCOUNTER — Other Ambulatory Visit: Payer: Self-pay

## 2015-06-14 DIAGNOSIS — Z1231 Encounter for screening mammogram for malignant neoplasm of breast: Secondary | ICD-10-CM

## 2015-06-23 ENCOUNTER — Ambulatory Visit
Admission: RE | Admit: 2015-06-23 | Discharge: 2015-06-23 | Disposition: A | Discharge status: Home / Self Care | Payer: Commercial Managed Care - HMO | Source: Ambulatory Visit

## 2015-06-23 DIAGNOSIS — Z1231 Encounter for screening mammogram for malignant neoplasm of breast: Secondary | ICD-10-CM

## 2016-02-11 ENCOUNTER — Other Ambulatory Visit: Payer: Self-pay | Admitting: Nephrology

## 2016-02-11 DIAGNOSIS — N183 Chronic kidney disease, stage 3 (moderate): Secondary | ICD-10-CM

## 2016-02-14 ENCOUNTER — Ambulatory Visit
Admission: RE | Admit: 2016-02-14 | Discharge: 2016-02-14 | Disposition: A | Discharge status: Home / Self Care | Payer: BLUE CROSS/BLUE SHIELD | Source: Ambulatory Visit | Attending: Nephrology | Admitting: Nephrology

## 2016-02-14 DIAGNOSIS — N183 Chronic kidney disease, stage 3 (moderate): Secondary | ICD-10-CM

## 2016-02-15 ENCOUNTER — Other Ambulatory Visit: Payer: Commercial Managed Care - HMO

## 2016-11-28 ENCOUNTER — Other Ambulatory Visit: Payer: Self-pay | Admitting: Internal Medicine

## 2016-11-28 DIAGNOSIS — Z1231 Encounter for screening mammogram for malignant neoplasm of breast: Secondary | ICD-10-CM

## 2016-12-13 ENCOUNTER — Ambulatory Visit
Admission: RE | Admit: 2016-12-13 | Discharge: 2016-12-13 | Disposition: A | Discharge status: Home / Self Care | Payer: BLUE CROSS/BLUE SHIELD | Source: Ambulatory Visit | Attending: Internal Medicine | Admitting: Internal Medicine

## 2016-12-13 DIAGNOSIS — Z1231 Encounter for screening mammogram for malignant neoplasm of breast: Secondary | ICD-10-CM

## 2016-12-14 ENCOUNTER — Other Ambulatory Visit: Payer: Self-pay | Admitting: Internal Medicine

## 2016-12-14 DIAGNOSIS — R928 Other abnormal and inconclusive findings on diagnostic imaging of breast: Secondary | ICD-10-CM

## 2016-12-19 ENCOUNTER — Ambulatory Visit
Admission: RE | Admit: 2016-12-19 | Discharge: 2016-12-19 | Disposition: A | Discharge status: Home / Self Care | Payer: BLUE CROSS/BLUE SHIELD | Source: Ambulatory Visit | Attending: Internal Medicine | Admitting: Internal Medicine

## 2016-12-19 DIAGNOSIS — R928 Other abnormal and inconclusive findings on diagnostic imaging of breast: Secondary | ICD-10-CM

## 2016-12-29 ENCOUNTER — Other Ambulatory Visit (HOSPITAL_COMMUNITY)
Admission: RE | Admit: 2016-12-29 | Discharge: 2016-12-29 | Disposition: A | Discharge status: Home / Self Care | Payer: BLUE CROSS/BLUE SHIELD | Source: Ambulatory Visit | Attending: Obstetrics and Gynecology | Admitting: Obstetrics and Gynecology

## 2016-12-29 ENCOUNTER — Other Ambulatory Visit: Payer: Self-pay | Admitting: Obstetrics and Gynecology

## 2016-12-29 DIAGNOSIS — Z01419 Encounter for gynecological examination (general) (routine) without abnormal findings: Secondary | ICD-10-CM | POA: Diagnosis present

## 2016-12-29 DIAGNOSIS — Z1151 Encounter for screening for human papillomavirus (HPV): Secondary | ICD-10-CM | POA: Insufficient documentation

## 2017-01-01 LAB — CYTOLOGY - PAP
Diagnosis: NEGATIVE
HPV: NOT DETECTED

## 2017-08-14 ENCOUNTER — Other Ambulatory Visit: Payer: Self-pay | Admitting: Internal Medicine

## 2017-08-14 DIAGNOSIS — R110 Nausea: Secondary | ICD-10-CM

## 2017-08-14 DIAGNOSIS — R52 Pain, unspecified: Secondary | ICD-10-CM

## 2017-08-15 ENCOUNTER — Other Ambulatory Visit: Payer: Self-pay | Admitting: Internal Medicine

## 2017-08-15 ENCOUNTER — Ambulatory Visit
Admission: RE | Admit: 2017-08-15 | Discharge: 2017-08-15 | Disposition: A | Discharge status: Home / Self Care | Payer: BLUE CROSS/BLUE SHIELD | Source: Ambulatory Visit | Attending: Internal Medicine | Admitting: Internal Medicine

## 2017-08-15 DIAGNOSIS — R110 Nausea: Secondary | ICD-10-CM

## 2017-08-15 DIAGNOSIS — R109 Unspecified abdominal pain: Secondary | ICD-10-CM

## 2017-08-15 DIAGNOSIS — R52 Pain, unspecified: Secondary | ICD-10-CM

## 2017-08-17 ENCOUNTER — Ambulatory Visit
Admission: RE | Admit: 2017-08-17 | Discharge: 2017-08-17 | Disposition: A | Discharge status: Home / Self Care | Payer: BLUE CROSS/BLUE SHIELD | Source: Ambulatory Visit | Attending: Internal Medicine | Admitting: Internal Medicine

## 2017-08-17 DIAGNOSIS — R109 Unspecified abdominal pain: Secondary | ICD-10-CM

## 2017-09-13 ENCOUNTER — Other Ambulatory Visit (HOSPITAL_COMMUNITY): Payer: Self-pay | Admitting: General Surgery

## 2017-09-13 DIAGNOSIS — R160 Hepatomegaly, not elsewhere classified: Secondary | ICD-10-CM

## 2017-09-19 ENCOUNTER — Other Ambulatory Visit: Payer: Self-pay | Admitting: Radiology

## 2017-09-20 ENCOUNTER — Other Ambulatory Visit: Payer: Self-pay | Admitting: Student

## 2017-09-21 ENCOUNTER — Encounter (HOSPITAL_COMMUNITY): Payer: Self-pay

## 2017-09-21 ENCOUNTER — Ambulatory Visit (HOSPITAL_COMMUNITY)
Admission: RE | Admit: 2017-09-21 | Discharge: 2017-09-21 | Disposition: A | Discharge status: Home / Self Care | Payer: BLUE CROSS/BLUE SHIELD | Source: Ambulatory Visit | Attending: General Surgery | Admitting: General Surgery

## 2017-09-21 DIAGNOSIS — Z8249 Family history of ischemic heart disease and other diseases of the circulatory system: Secondary | ICD-10-CM | POA: Insufficient documentation

## 2017-09-21 DIAGNOSIS — I2510 Atherosclerotic heart disease of native coronary artery without angina pectoris: Secondary | ICD-10-CM | POA: Insufficient documentation

## 2017-09-21 DIAGNOSIS — Z79899 Other long term (current) drug therapy: Secondary | ICD-10-CM | POA: Insufficient documentation

## 2017-09-21 DIAGNOSIS — R079 Chest pain, unspecified: Secondary | ICD-10-CM | POA: Insufficient documentation

## 2017-09-21 DIAGNOSIS — Z9889 Other specified postprocedural states: Secondary | ICD-10-CM | POA: Diagnosis not present

## 2017-09-21 DIAGNOSIS — I959 Hypotension, unspecified: Secondary | ICD-10-CM | POA: Insufficient documentation

## 2017-09-21 DIAGNOSIS — Z833 Family history of diabetes mellitus: Secondary | ICD-10-CM | POA: Insufficient documentation

## 2017-09-21 DIAGNOSIS — I10 Essential (primary) hypertension: Secondary | ICD-10-CM | POA: Diagnosis not present

## 2017-09-21 DIAGNOSIS — Z7982 Long term (current) use of aspirin: Secondary | ICD-10-CM | POA: Diagnosis not present

## 2017-09-21 DIAGNOSIS — C227 Other specified carcinomas of liver: Secondary | ICD-10-CM | POA: Insufficient documentation

## 2017-09-21 DIAGNOSIS — R160 Hepatomegaly, not elsewhere classified: Secondary | ICD-10-CM

## 2017-09-21 DIAGNOSIS — E119 Type 2 diabetes mellitus without complications: Secondary | ICD-10-CM | POA: Diagnosis not present

## 2017-09-21 DIAGNOSIS — E785 Hyperlipidemia, unspecified: Secondary | ICD-10-CM | POA: Diagnosis not present

## 2017-09-21 DIAGNOSIS — R109 Unspecified abdominal pain: Secondary | ICD-10-CM | POA: Insufficient documentation

## 2017-09-21 DIAGNOSIS — Z87891 Personal history of nicotine dependence: Secondary | ICD-10-CM | POA: Diagnosis not present

## 2017-09-21 DIAGNOSIS — Z823 Family history of stroke: Secondary | ICD-10-CM | POA: Insufficient documentation

## 2017-09-21 DIAGNOSIS — K769 Liver disease, unspecified: Secondary | ICD-10-CM | POA: Diagnosis present

## 2017-09-21 DIAGNOSIS — Z794 Long term (current) use of insulin: Secondary | ICD-10-CM | POA: Diagnosis not present

## 2017-09-21 LAB — CBC
HCT: 34.1 % — ABNORMAL LOW (ref 36.0–46.0)
HEMOGLOBIN: 10.6 g/dL — AB (ref 12.0–15.0)
MCH: 28.6 pg (ref 26.0–34.0)
MCHC: 31.1 g/dL (ref 30.0–36.0)
MCV: 92.2 fL (ref 78.0–100.0)
PLATELETS: 303 10*3/uL (ref 150–400)
RBC: 3.7 MIL/uL — AB (ref 3.87–5.11)
RDW: 14.7 % (ref 11.5–15.5)
WBC: 7.7 10*3/uL (ref 4.0–10.5)

## 2017-09-21 LAB — APTT: aPTT: 34 seconds (ref 24–36)

## 2017-09-21 LAB — GLUCOSE, CAPILLARY
GLUCOSE-CAPILLARY: 87 mg/dL (ref 65–99)
Glucose-Capillary: 95 mg/dL (ref 65–99)

## 2017-09-21 LAB — PROTIME-INR
INR: 1.12
Prothrombin Time: 14.3 seconds (ref 11.4–15.2)

## 2017-09-21 MED ORDER — GELATIN ABSORBABLE 12-7 MM EX MISC
CUTANEOUS | Status: AC
  Filled 2017-09-21: qty 1

## 2017-09-21 MED ORDER — MIDAZOLAM HCL 2 MG/2ML IJ SOLN
INTRAMUSCULAR | Status: AC
  Filled 2017-09-21: qty 4

## 2017-09-21 MED ORDER — FENTANYL CITRATE (PF) 100 MCG/2ML IJ SOLN
INTRAMUSCULAR | Status: AC
  Filled 2017-09-21: qty 4

## 2017-09-21 MED ORDER — MIDAZOLAM HCL 2 MG/2ML IJ SOLN
INTRAMUSCULAR | Status: AC | PRN
  Administered 2017-09-21: 1 mg via INTRAVENOUS

## 2017-09-21 MED ORDER — NITROGLYCERIN 0.4 MG SL SUBL
SUBLINGUAL_TABLET | SUBLINGUAL | Status: AC
  Filled 2017-09-21: qty 3

## 2017-09-21 MED ORDER — NITROGLYCERIN 0.4 MG SL SUBL
0.4000 mg | SUBLINGUAL_TABLET | SUBLINGUAL | Status: DC | PRN
  Filled 2017-09-21: qty 25

## 2017-09-21 MED ORDER — FENTANYL CITRATE (PF) 100 MCG/2ML IJ SOLN
INTRAMUSCULAR | Status: AC | PRN
  Administered 2017-09-21: 50 ug via INTRAVENOUS

## 2017-09-21 MED ORDER — SODIUM CHLORIDE 0.9 % IV SOLN: INTRAVENOUS | Status: DC

## 2017-09-21 MED ORDER — LIDOCAINE HCL (PF) 1 % IJ SOLN
INTRAMUSCULAR | Status: AC
  Filled 2017-09-21: qty 30

## 2017-09-21 NOTE — H&P (Signed)
Chief Complaint: Patient was seen in consultation today for liver lesion biopsy at the request of Mickeal Skinner  Referring Physician(s): Mickeal Skinner  Supervising Physician: Jacqulynn Cadet  Patient Status: [facility] - Out-pt  History of Present Illness: Julie Barker is a 62 y.o. female   Abd pain few weeks ago Worsened and persisted Imaging  CT 08/17/17: IMPRESSION: 1. Limited evaluation of the gallbladder and liver due to lack of intravenous contrast material. Gallbladder is not discretely identified and gallbladder fossa is filled with soft tissue and calcification. This is associated with abnormal low attenuation in the liver along the gallbladder fossa. The most concerning possibility for this appearance would be a large gallbladder mass with direct invasion of the adjacent liver parenchyma. Given the additional lesions within the liver parenchyma, worrisome for metastatic disease, this possibility is the most pressing concern. Considered less likely would be a chronic inflammation/cholecystitis with secondary changes in the liver. If renal function permits, MRI of the abdomen without and with contrast would likely provide additional insight. 2. Mild lymphadenopathy in the hepatoduodenal ligament. Metastatic disease is a concern although reactive enlargement could have this appearance. 3. Marked uterine enlargement due to fibroid disease  Now scheduled for liver mass biopsy  Of note: pt did have vagal reaction in [place] after IV start Developed some chest pain Hypotensive  Resolved quickly; BP 109/63; no pain EKG shows no changes   Past Medical History:  Diagnosis Date  . Coronary artery disease    has diffuse distal vessel disease. Managed medically  . Diabetes mellitus   . Fibroid tumor   . Hyperlipidemia   . Hypertension     Past Surgical History:  Procedure Laterality Date  . CARDIAC CATHETERIZATION  04/05/2007   EF 75%.MODERATE  3 VESSEL CAD  . CARDIOVASCULAR STRESS TEST  02/28/2007   EF 53%. NO ISCHEMIA  . COLONOSCOPY WITH PROPOFOL N/A 05/31/2015   Procedure: COLONOSCOPY WITH PROPOFOL;  Surgeon: Garlan Fair, MD;  Location: [facility];  Service: Endoscopy;  Laterality: N/A;    Allergies: Patient has no known allergies.  Medications: Prior to Admission medications   Medication Sig Start Date End Date Taking? Authorizing Provider  acetaminophen (TYLENOL) 500 MG tablet Take 1,000 mg by mouth every 6 (six) hours as needed for mild pain or moderate pain.   Yes [provider]  amLODipine (NORVASC) 10 MG tablet Take 10 mg by mouth every morning.   Yes [provider]  aspirin 81 MG tablet Take 81 mg by mouth daily.     Yes [provider]  calcium-vitamin D (OSCAL WITH D) 500-200 MG-UNIT per tablet Take 1 tablet by mouth daily with breakfast.   Yes [provider]  carvedilol (COREG) 25 MG tablet TAKE ONE TABLET BY MOUTH TWICE DAILY WITH MEALS 05/23/12  Yes Burtis Junes, NP  CINNAMON PO Take 500 mg by mouth daily.   Yes [provider]  ferrous gluconate (FERGON) 324 MG tablet Take 324 mg by mouth daily with breakfast.   Yes [provider]  fish oil-omega-3 fatty acids 1000 MG capsule Take 2 g by mouth daily.     Yes [provider]  glimepiride (AMARYL) 2 MG tablet Take 2 mg by mouth daily before breakfast.     Yes [provider]  hydrochlorothiazide (HYDRODIURIL) 25 MG tablet Take 25 mg by mouth every morning.    Yes [provider]  insulin aspart (NOVOLOG FLEXPEN) 100 UNIT/ML FlexPen Inject 10 Units into the  skin 2 (two) times daily.   Yes [provider]  insulin glargine (LANTUS) 100 UNIT/ML injection Inject 50 Units into the skin 2 (two) times daily.    Yes [provider]  isosorbide mononitrate (IMDUR) 60 MG 24 hr tablet Take 60 mg by mouth every morning.    Yes [provider]  latanoprost  (XALATAN) 0.005 % ophthalmic solution Place 1 drop into both eyes at bedtime.   Yes [provider]  liraglutide (VICTOZA) 18 MG/3ML SOPN Inject 1.8 mg into the skin every evening.   Yes [provider]  multivitamin-iron-minerals-folic acid (CENTRUM) chewable tablet Chew 1 tablet by mouth daily.     Yes [provider]  ramipril (ALTACE) 10 MG capsule Take 10 mg by mouth 2 (two) times daily.     Yes [provider]  saxagliptin HCl (ONGLYZA) 5 MG TABS tablet Take 5 mg by mouth daily.   Yes [provider]  simvastatin (ZOCOR) 20 MG tablet Take 20 mg by mouth every morning.   Yes [provider]     Family History  Problem Relation Age of Onset  . Aneurysm Mother   . Heart attack Father   . Stroke Father   . Hypertension Sister   . Diabetes Sister   . Rheumatic fever Sister   . Hypertension Brother   . Hypertension Brother   . Hypertension Brother   . Hypertension Sister   . Diabetes Sister     Social History   Socioeconomic History  . Marital status: Married    Spouse name: None  . Number of children: None  . Years of education: None  . Highest education level: None  Social Needs  . Financial resource strain: None  . Food insecurity - worry: None  . Food insecurity - inability: None  . Transportation needs - medical: None  . Transportation needs - non-medical: None  Occupational History  . None  Tobacco Use  . Smoking status: Former Smoker    Last attempt to quit: 09/11/1998    Years since quitting: 19.0  Substance and Sexual Activity  . Alcohol use: No  . Drug use: No  . Sexual activity: None  Other Topics Concern  . None  Social History Narrative  . None    Review of Systems: A 12 point ROS discussed and pertinent positives are indicated in the HPI above.  All other systems are negative.  Review of Systems  Constitutional: Negative for activity change, fatigue, fever and unexpected weight change.    Respiratory: Negative for cough and shortness of breath.   Cardiovascular: Negative for chest pain.  Gastrointestinal: Positive for abdominal pain.  Musculoskeletal: Negative for back pain.  Neurological: Negative for weakness.  Psychiatric/Behavioral: Negative for behavioral problems and confusion.    Vital Signs: BP 109/63   Pulse 61   Temp 98.1 F (36.7 C) (Oral)   Ht 5\' 6"  (1.676 m)   Wt 206 lb (93.4 kg)   BMI 33.25 kg/m   Physical Exam  Constitutional: She is oriented to person, place, and time.  Cardiovascular: Normal rate, regular rhythm and normal heart sounds.  Pulmonary/Chest: Effort normal and breath sounds normal.  Abdominal: Soft. Bowel sounds are normal.  Musculoskeletal: Normal range of motion.  Neurological: She is alert and oriented to person, place, and time.  Skin: Skin is warm and dry.  Psychiatric: She has a normal mood and affect. Her behavior is normal. Judgment and thought content normal.  Nursing note and vitals  reviewed.   Imaging: No results found.  Labs:  CBC: Recent Labs    09/21/17 1104  WBC 7.7  HGB 10.6*  HCT 34.1*  PLT 303    COAGS: Recent Labs    09/21/17 1104  INR 1.12  APTT 34    BMP: No results for input(s): NA, K, CL, CO2, GLUCOSE, BUN, CALCIUM, CREATININE, GFRNONAA, GFRAA in the last 8760 hours.  Invalid input(s): CMP  LIVER FUNCTION TESTS: No results for input(s): BILITOT, AST, ALT, ALKPHOS, PROT, ALBUMIN in the last 8760 hours.  TUMOR MARKERS: No results for input(s): AFPTM, CEA, CA199, CHROMGRNA in the last 8760 hours.  Assessment and Plan:  Abd pain for weeks CT reveals liver lesion Now scheduled for biopsy of same Risks and benefits discussed with the patient including, but not limited to bleeding, infection, damage to adjacent structures or low yield requiring additional tests. All of the patient's questions were answered, patient is agreeable to proceed. Consent signed and in chart.  Thank you for  this interesting consult.  I greatly enjoyed meeting ETSUKO DIEROLF and look forward to participating in their care.  A copy of this report was sent to the requesting provider on this date.  Electronically Signed: Lavonia Drafts, PA-C 09/21/2017, 12:02 PM   I spent a total of  30 Minutes   in face to face in clinical consultation, greater than 50% of which was counseling/coordinating care for liver lesion biopsy

## 2017-09-21 NOTE — Procedures (Signed)
Interventional Radiology Procedure Note  Procedure: US guided core biopsy of liver mass adjacent to GB fossa  Complications: None  Estimated Blood Loss: None  Recommendations: - DC home after 3 hrs bedrest  Signed,  Criselda Peaches, MD

## 2017-09-21 NOTE — Discharge Instructions (Signed)
Liver Biopsy, Care After °These instructions give you information on caring for yourself after your procedure. Your doctor may also give you more specific instructions. Call your doctor if you have any problems or questions after your procedure. °Follow these instructions at home: °· Rest at home for 1-2 days or as told by your doctor. °· Have someone stay with you for at least 24 hours. °· Do not do these things in the first 24 hours: °? Drive. °? Use machinery. °? Take care of other people. °? Sign legal documents. °? Take a bath or shower. °· There are many different ways to close and cover a cut (incision). For example, a cut can be closed with stitches, skin glue, or adhesive strips. Follow your doctor's instructions on: °? Taking care of your cut. °? Changing and removing your bandage (dressing). °? Removing whatever was used to close your cut. °· Do not drink alcohol in the first week. °· Do not lift more than 5 pounds or play contact sports for the first 2 weeks. °· Take medicines only as told by your doctor. For 1 week, do not take medicine that has aspirin in it or medicines like ibuprofen. °· Get your test results. °Contact a doctor if: °· A cut bleeds and leaves more than just a small spot of blood. °· A cut is red, puffs up (swells), or hurts more than before. °· Fluid or something else comes from a cut. °· A cut smells bad. °· You have a fever or chills. °Get help right away if: °· You have swelling, bloating, or pain in your belly (abdomen). °· You get dizzy or faint. °· You have a rash. °· You feel sick to your stomach (nauseous) or throw up (vomit). °· You have trouble breathing, feel short of breath, or feel faint. °· Your chest hurts. °· You have problems talking or seeing. °· You have trouble balancing or moving your arms or legs. °This information is not intended to replace advice given to you by your health care provider. Make sure you discuss any questions you have with your health care  provider. °Document Released: 06/06/2008 Document Revised: 02/03/2016 Document Reviewed: 10/24/2013 °Elsevier Interactive Patient Education © 2018 [employer] ° °

## 2017-09-21 NOTE — Progress Notes (Signed)
Pt became sweaty and verbally nonresponsive after IV insertion. Placed in trendelenberg. Pt began to respond- Reports chest pain 7/10 mid chest pressure. Also reports  lightheadedness and blurry vision. EKG done and O2 started per chest pain protocol. BP 74/45. HR 44. PA notified.

## 2017-09-21 NOTE — Sedation Documentation (Signed)
Patient denies pain and is resting comfortably.  

## 2017-09-21 NOTE — Progress Notes (Signed)
PA in to see pt. Pain now 2/10. BP 109/63. HR 62. O2 sat 100%.

## 2017-09-21 NOTE — Sedation Documentation (Signed)
Patient is resting comfortably. 

## 2017-10-06 ENCOUNTER — Other Ambulatory Visit: Payer: Self-pay | Admitting: General Surgery

## 2017-10-06 DIAGNOSIS — C23 Malignant neoplasm of gallbladder: Secondary | ICD-10-CM

## 2017-10-09 ENCOUNTER — Telehealth: Payer: Self-pay | Admitting: Oncology

## 2017-10-09 ENCOUNTER — Encounter: Payer: Self-pay | Admitting: Oncology

## 2017-10-09 NOTE — Telephone Encounter (Signed)
Appt has been scheduled for the pt to see Dr. Benay Spice on 2/11 at 2pm. Pt aware to arrive 30 minutes early. Letter mailed.

## 2017-10-14 ENCOUNTER — Ambulatory Visit
Admission: RE | Admit: 2017-10-14 | Discharge: 2017-10-14 | Disposition: A | Discharge status: Home / Self Care | Payer: BLUE CROSS/BLUE SHIELD | Source: Ambulatory Visit | Attending: General Surgery | Admitting: General Surgery

## 2017-10-14 DIAGNOSIS — C23 Malignant neoplasm of gallbladder: Secondary | ICD-10-CM

## 2017-10-14 MED ORDER — GADOBENATE DIMEGLUMINE 529 MG/ML IV SOLN
9.0000 mL | Freq: Once | INTRAVENOUS | Status: AC | PRN
  Administered 2017-10-14: 9 mL via INTRAVENOUS

## 2017-10-17 ENCOUNTER — Emergency Department (HOSPITAL_COMMUNITY): Payer: BLUE CROSS/BLUE SHIELD

## 2017-10-17 ENCOUNTER — Inpatient Hospital Stay (HOSPITAL_COMMUNITY)
Admission: EM | Admit: 2017-10-17 | Discharge: 2017-10-20 | DRG: 377 | Disposition: A | Discharge status: Home / Self Care | Payer: BLUE CROSS/BLUE SHIELD | Attending: Internal Medicine | Admitting: Internal Medicine

## 2017-10-17 ENCOUNTER — Encounter (HOSPITAL_COMMUNITY): Payer: Self-pay | Admitting: Emergency Medicine

## 2017-10-17 DIAGNOSIS — Z7982 Long term (current) use of aspirin: Secondary | ICD-10-CM | POA: Diagnosis not present

## 2017-10-17 DIAGNOSIS — R195 Other fecal abnormalities: Secondary | ICD-10-CM | POA: Diagnosis not present

## 2017-10-17 DIAGNOSIS — C799 Secondary malignant neoplasm of unspecified site: Secondary | ICD-10-CM

## 2017-10-17 DIAGNOSIS — R630 Anorexia: Secondary | ICD-10-CM | POA: Diagnosis not present

## 2017-10-17 DIAGNOSIS — E871 Hypo-osmolality and hyponatremia: Secondary | ICD-10-CM | POA: Diagnosis present

## 2017-10-17 DIAGNOSIS — N179 Acute kidney failure, unspecified: Secondary | ICD-10-CM | POA: Diagnosis present

## 2017-10-17 DIAGNOSIS — C787 Secondary malignant neoplasm of liver and intrahepatic bile duct: Secondary | ICD-10-CM | POA: Diagnosis present

## 2017-10-17 DIAGNOSIS — E872 Acidosis: Secondary | ICD-10-CM | POA: Diagnosis present

## 2017-10-17 DIAGNOSIS — N390 Urinary tract infection, site not specified: Secondary | ICD-10-CM | POA: Diagnosis not present

## 2017-10-17 DIAGNOSIS — I10 Essential (primary) hypertension: Secondary | ICD-10-CM | POA: Diagnosis present

## 2017-10-17 DIAGNOSIS — L299 Pruritus, unspecified: Secondary | ICD-10-CM | POA: Diagnosis not present

## 2017-10-17 DIAGNOSIS — E785 Hyperlipidemia, unspecified: Secondary | ICD-10-CM | POA: Diagnosis present

## 2017-10-17 DIAGNOSIS — I25119 Atherosclerotic heart disease of native coronary artery with unspecified angina pectoris: Secondary | ICD-10-CM | POA: Diagnosis not present

## 2017-10-17 DIAGNOSIS — C801 Malignant (primary) neoplasm, unspecified: Secondary | ICD-10-CM | POA: Diagnosis not present

## 2017-10-17 DIAGNOSIS — K2210 Ulcer of esophagus without bleeding: Secondary | ICD-10-CM | POA: Diagnosis not present

## 2017-10-17 DIAGNOSIS — K269 Duodenal ulcer, unspecified as acute or chronic, without hemorrhage or perforation: Secondary | ICD-10-CM | POA: Diagnosis not present

## 2017-10-17 DIAGNOSIS — Z79899 Other long term (current) drug therapy: Secondary | ICD-10-CM | POA: Diagnosis not present

## 2017-10-17 DIAGNOSIS — K264 Chronic or unspecified duodenal ulcer with hemorrhage: Secondary | ICD-10-CM | POA: Diagnosis present

## 2017-10-17 DIAGNOSIS — C772 Secondary and unspecified malignant neoplasm of intra-abdominal lymph nodes: Secondary | ICD-10-CM | POA: Diagnosis present

## 2017-10-17 DIAGNOSIS — C23 Malignant neoplasm of gallbladder: Secondary | ICD-10-CM | POA: Diagnosis present

## 2017-10-17 DIAGNOSIS — D62 Acute posthemorrhagic anemia: Secondary | ICD-10-CM | POA: Diagnosis present

## 2017-10-17 DIAGNOSIS — Z87891 Personal history of nicotine dependence: Secondary | ICD-10-CM | POA: Diagnosis not present

## 2017-10-17 DIAGNOSIS — Z794 Long term (current) use of insulin: Secondary | ICD-10-CM

## 2017-10-17 DIAGNOSIS — E1165 Type 2 diabetes mellitus with hyperglycemia: Secondary | ICD-10-CM

## 2017-10-17 DIAGNOSIS — K2211 Ulcer of esophagus with bleeding: Secondary | ICD-10-CM | POA: Diagnosis present

## 2017-10-17 DIAGNOSIS — I2510 Atherosclerotic heart disease of native coronary artery without angina pectoris: Secondary | ICD-10-CM | POA: Diagnosis present

## 2017-10-17 DIAGNOSIS — K831 Obstruction of bile duct: Secondary | ICD-10-CM | POA: Diagnosis present

## 2017-10-17 DIAGNOSIS — C229 Malignant neoplasm of liver, not specified as primary or secondary: Secondary | ICD-10-CM | POA: Insufficient documentation

## 2017-10-17 DIAGNOSIS — E860 Dehydration: Secondary | ICD-10-CM | POA: Diagnosis present

## 2017-10-17 DIAGNOSIS — R634 Abnormal weight loss: Secondary | ICD-10-CM | POA: Diagnosis not present

## 2017-10-17 DIAGNOSIS — D649 Anemia, unspecified: Secondary | ICD-10-CM | POA: Diagnosis not present

## 2017-10-17 DIAGNOSIS — D259 Leiomyoma of uterus, unspecified: Secondary | ICD-10-CM | POA: Diagnosis not present

## 2017-10-17 DIAGNOSIS — K921 Melena: Secondary | ICD-10-CM | POA: Diagnosis present

## 2017-10-17 DIAGNOSIS — E119 Type 2 diabetes mellitus without complications: Secondary | ICD-10-CM

## 2017-10-17 DIAGNOSIS — D500 Iron deficiency anemia secondary to blood loss (chronic): Secondary | ICD-10-CM | POA: Diagnosis not present

## 2017-10-17 HISTORY — DX: Type 2 diabetes mellitus without complications: E11.9

## 2017-10-17 HISTORY — DX: Duodenal ulcer, unspecified as acute or chronic, without hemorrhage or perforation: K26.9

## 2017-10-17 HISTORY — DX: Ulcer of esophagus without bleeding: K22.10

## 2017-10-17 HISTORY — DX: Malignant neoplasm of liver, not specified as primary or secondary: C22.9

## 2017-10-17 LAB — I-STAT TROPONIN, ED: Troponin i, poc: 0 ng/mL (ref 0.00–0.08)

## 2017-10-17 LAB — BASIC METABOLIC PANEL
ANION GAP: 11 (ref 5–15)
BUN: 84 mg/dL — ABNORMAL HIGH (ref 6–20)
CO2: 16 mmol/L — ABNORMAL LOW (ref 22–32)
Calcium: 9.3 mg/dL (ref 8.9–10.3)
Chloride: 102 mmol/L (ref 101–111)
Creatinine, Ser: 1.93 mg/dL — ABNORMAL HIGH (ref 0.44–1.00)
GFR, EST AFRICAN AMERICAN: 31 mL/min — AB (ref >60)
GFR, EST NON AFRICAN AMERICAN: 26 mL/min — AB (ref >60)
GLUCOSE: 229 mg/dL — AB (ref 65–99)
Potassium: 4.2 mmol/L (ref 3.5–5.1)
Sodium: 129 mmol/L — ABNORMAL LOW (ref 135–145)

## 2017-10-17 LAB — HEPATIC FUNCTION PANEL
ALBUMIN: 2.4 g/dL — AB (ref 3.5–5.0)
ALT: 159 U/L — AB (ref 14–54)
AST: 171 U/L — AB (ref 15–41)
Alkaline Phosphatase: 1004 U/L — ABNORMAL HIGH (ref 38–126)
BILIRUBIN DIRECT: 3.5 mg/dL — AB (ref 0.1–0.5)
Indirect Bilirubin: 2.3 mg/dL — ABNORMAL HIGH (ref 0.3–0.9)
TOTAL PROTEIN: 7.6 g/dL (ref 6.5–8.1)
Total Bilirubin: 5.8 mg/dL — ABNORMAL HIGH (ref 0.3–1.2)

## 2017-10-17 LAB — CBC
HEMATOCRIT: 24.2 % — AB (ref 36.0–46.0)
Hemoglobin: 7.4 g/dL — ABNORMAL LOW (ref 12.0–15.0)
MCH: 28.8 pg (ref 26.0–34.0)
MCHC: 30.6 g/dL (ref 30.0–36.0)
MCV: 94.2 fL (ref 78.0–100.0)
Platelets: 398 10*3/uL (ref 150–400)
RBC: 2.57 MIL/uL — AB (ref 3.87–5.11)
RDW: 19 % — ABNORMAL HIGH (ref 11.5–15.5)
WBC: 11.1 10*3/uL — ABNORMAL HIGH (ref 4.0–10.5)

## 2017-10-17 LAB — IRON AND TIBC
IRON: 51 ug/dL (ref 28–170)
Saturation Ratios: 19 % (ref 10.4–31.8)
TIBC: 272 ug/dL (ref 250–450)
UIBC: 221 ug/dL

## 2017-10-17 LAB — LACTATE DEHYDROGENASE: LDH: 172 U/L (ref 98–192)

## 2017-10-17 LAB — PROTIME-INR
INR: 1.42
Prothrombin Time: 17.2 seconds — ABNORMAL HIGH (ref 11.4–15.2)

## 2017-10-17 LAB — DIFFERENTIAL
Basophils Absolute: 0 10*3/uL (ref 0.0–0.1)
Basophils Relative: 0 %
Eosinophils Absolute: 0.3 10*3/uL (ref 0.0–0.7)
Eosinophils Relative: 2 %
LYMPHS ABS: 1 10*3/uL (ref 0.7–4.0)
LYMPHS PCT: 9 %
MONO ABS: 1.2 10*3/uL — AB (ref 0.1–1.0)
MONOS PCT: 11 %
NEUTROS ABS: 8.6 10*3/uL — AB (ref 1.7–7.7)
Neutrophils Relative %: 78 %

## 2017-10-17 LAB — RETICULOCYTES
RBC.: 2.34 MIL/uL — ABNORMAL LOW (ref 3.87–5.11)
RETIC CT PCT: 6.1 % — AB (ref 0.4–3.1)
Retic Count, Absolute: 142.7 10*3/uL (ref 19.0–186.0)

## 2017-10-17 LAB — FOLATE: Folate: 21.9 ng/mL (ref >5.9)

## 2017-10-17 LAB — ABO/RH: ABO/RH(D): AB POS

## 2017-10-17 LAB — APTT: APTT: 35 s (ref 24–36)

## 2017-10-17 LAB — FERRITIN: FERRITIN: 562 ng/mL — AB (ref 11–307)

## 2017-10-17 LAB — POC OCCULT BLOOD, ED: Fecal Occult Bld: POSITIVE — AB

## 2017-10-17 LAB — PREPARE RBC (CROSSMATCH)

## 2017-10-17 LAB — VITAMIN B12: Vitamin B-12: 7151 pg/mL — ABNORMAL HIGH (ref 180–914)

## 2017-10-17 LAB — GLUCOSE, CAPILLARY: GLUCOSE-CAPILLARY: 365 mg/dL — AB (ref 65–99)

## 2017-10-17 MED ORDER — SODIUM BICARBONATE 650 MG PO TABS
650.0000 mg | ORAL_TABLET | Freq: Three times a day (TID) | ORAL | Status: DC
  Administered 2017-10-17 – 2017-10-20 (×10): 650 mg via ORAL
  Filled 2017-10-17 (×10): qty 1

## 2017-10-17 MED ORDER — PANTOPRAZOLE SODIUM 40 MG IV SOLR
40.0000 mg | Freq: Two times a day (BID) | INTRAVENOUS | Status: DC
  Administered 2017-10-17: 40 mg via INTRAVENOUS
  Filled 2017-10-17: qty 40

## 2017-10-17 MED ORDER — CARVEDILOL 3.125 MG PO TABS
3.1250 mg | ORAL_TABLET | Freq: Two times a day (BID) | ORAL | Status: DC
  Administered 2017-10-17 – 2017-10-19 (×4): 3.125 mg via ORAL
  Filled 2017-10-17 (×4): qty 1

## 2017-10-17 MED ORDER — SODIUM CHLORIDE 0.9 % IV SOLN
INTRAVENOUS | Status: AC
  Administered 2017-10-18: 13:00:00 via INTRAVENOUS

## 2017-10-17 MED ORDER — INSULIN ASPART 100 UNIT/ML ~~LOC~~ SOLN
0.0000 [IU] | Freq: Three times a day (TID) | SUBCUTANEOUS | Status: DC
  Administered 2017-10-17: 9 [IU] via SUBCUTANEOUS
  Administered 2017-10-18: 2 [IU] via SUBCUTANEOUS
  Administered 2017-10-18 – 2017-10-19 (×3): 5 [IU] via SUBCUTANEOUS
  Administered 2017-10-19: 2 [IU] via SUBCUTANEOUS
  Administered 2017-10-20: 3 [IU] via SUBCUTANEOUS
  Administered 2017-10-20: 5 [IU] via SUBCUTANEOUS

## 2017-10-17 MED ORDER — LATANOPROST 0.005 % OP SOLN
1.0000 [drp] | Freq: Every day | OPHTHALMIC | Status: DC
  Administered 2017-10-19 – 2017-10-20 (×2): 1 [drp] via OPHTHALMIC
  Filled 2017-10-17 (×2): qty 2.5

## 2017-10-17 MED ORDER — SODIUM CHLORIDE 0.9 % IV BOLUS (SEPSIS)
1000.0000 mL | Freq: Once | INTRAVENOUS | Status: AC
  Administered 2017-10-17: 1000 mL via INTRAVENOUS

## 2017-10-17 MED ORDER — SODIUM CHLORIDE 0.9 % IV SOLN
Freq: Once | INTRAVENOUS | Status: AC
  Administered 2017-10-17: 15:00:00 via INTRAVENOUS

## 2017-10-17 NOTE — H&P (View-Only) (Signed)
[facility] Consult  Referring Provider: Dr.Xu([facility]) Primary Care Physician:  Wenda Low, MD Primary Gastroenterologist: Dr.Johnson  Reason for Consultation:   1. Anemia/heart stools possible melena 2.Obstructive jaundice/primary pancreaticobiliary adenocarcinoma with liver metastases  HPI: Julie Barker is a 62 y.o. female was sent to the ED by her primary care physician as she was found to be dehydrated and had abnormal labs.  Patient was in her usual state of health until December 2018 when she developed early satiety and postprandial pain. She was seen by Dr. Barry Dienes for possible biliary colic and underwent imaging concerning for gallbladder mass with invasion into the liver(noted on CAT scan on 08/27/17). She subsequently had a liver biopsy on 09/21/17 which showed adenocarcinoma arising from primary pancreaticobiliary origin. MRI from 10/14/17 showed gallbladder adenocarcinoma and multiple 8 to send hepatic metastases, measuring between 1.6-2.1 cm, large nodal metastases and upper abdomen. No intra-or extrahepatic biliary dilatation noted on CAT scan from 08/17/17.  Patient has lost significant amount of weight and complains of loss of appetite. No prior EGD. Last colonoscopy a few years ago normal as per patient.  Patient has noted yellowish discoloration of her skin for the last 3-4 days, stools were chalky- white for the past several days before turning dark/black for the last 3 days. She takes baby aspirin daily and denies use of any other NSAIDs. She denies difficulty swallowing or pain on swallowing, denies heartburn and acid reflux.  Currently denies nausea, vomiting or abdominal pain.   Past Medical History:  Diagnosis Date  . Coronary artery disease    has diffuse distal vessel disease. Managed medically  . Diabetes mellitus   . Fibroid tumor   . Hyperlipidemia   . Hypertension   . Liver cancer [facility])     Past Surgical History:   Procedure Laterality Date  . CARDIAC CATHETERIZATION  04/05/2007   EF 75%.MODERATE 3 VESSEL CAD  . CARDIOVASCULAR STRESS TEST  02/28/2007   EF 53%. NO ISCHEMIA  . COLONOSCOPY WITH PROPOFOL N/A 05/31/2015   Procedure: COLONOSCOPY WITH PROPOFOL;  Surgeon: Garlan Fair, MD;  Location: [facility];  Service: Endoscopy;  Laterality: N/A;    Prior to Admission medications   Medication Sig Start Date End Date Taking? Authorizing Provider  acetaminophen (TYLENOL) 500 MG tablet Take 1,000 mg by mouth every 6 (six) hours as needed for mild pain or moderate pain.   Yes [provider]  amLODipine (NORVASC) 10 MG tablet Take 10 mg by mouth every morning.   Yes [provider]  aspirin 81 MG tablet Take 81 mg by mouth daily.     Yes [provider]  carvedilol (COREG) 25 MG tablet TAKE ONE TABLET BY MOUTH TWICE DAILY WITH MEALS 05/23/12  Yes Burtis Junes, NP  hydrochlorothiazide (HYDRODIURIL) 25 MG tablet Take 25 mg by mouth every morning.    Yes [provider]  insulin aspart (NOVOLOG FLEXPEN) 100 UNIT/ML FlexPen Inject 2-10 Units into the skin 2 (two) times daily. Per sliding scale   Yes [provider]  insulin glargine (LANTUS) 100 UNIT/ML injection Inject 25 Units into the skin 2 (two) times daily.    Yes [provider]  isosorbide mononitrate (IMDUR) 60 MG 24 hr tablet Take 60 mg by mouth every morning.    Yes [provider]  latanoprost (XALATAN) 0.005 % ophthalmic solution Place 1 drop into both eyes daily.    Yes [provider]  ramipril (ALTACE) 10 MG capsule Take 10 mg by  mouth 2 (two) times daily.     Yes [provider]  simvastatin (ZOCOR) 20 MG tablet Take 20 mg by mouth every morning.   Yes [provider]    Current Facility-Administered Medications  Medication Dose Route Frequency Provider Last Rate Last Dose  . 0.9 %  sodium chloride infusion   Intravenous Continuous Florencia Reasons, MD       . latanoprost (XALATAN) 0.005 % ophthalmic solution 1 drop  1 drop Both Eyes Daily Florencia Reasons, MD       Current Outpatient Medications  Medication Sig Dispense Refill  . acetaminophen (TYLENOL) 500 MG tablet Take 1,000 mg by mouth every 6 (six) hours as needed for mild pain or moderate pain.    Marland Kitchen amLODipine (NORVASC) 10 MG tablet Take 10 mg by mouth every morning.    Marland Kitchen aspirin 81 MG tablet Take 81 mg by mouth daily.      . carvedilol (COREG) 25 MG tablet TAKE ONE TABLET BY MOUTH TWICE DAILY WITH MEALS 180 tablet 0  . hydrochlorothiazide (HYDRODIURIL) 25 MG tablet Take 25 mg by mouth every morning.     . insulin aspart (NOVOLOG FLEXPEN) 100 UNIT/ML FlexPen Inject 2-10 Units into the skin 2 (two) times daily. Per sliding scale    . insulin glargine (LANTUS) 100 UNIT/ML injection Inject 25 Units into the skin 2 (two) times daily.     . isosorbide mononitrate (IMDUR) 60 MG 24 hr tablet Take 60 mg by mouth every morning.     . latanoprost (XALATAN) 0.005 % ophthalmic solution Place 1 drop into both eyes daily.     . ramipril (ALTACE) 10 MG capsule Take 10 mg by mouth 2 (two) times daily.      . simvastatin (ZOCOR) 20 MG tablet Take 20 mg by mouth every morning.      Allergies as of 10/17/2017  . (No Known Allergies)    Family History  Problem Relation Age of Onset  . Aneurysm Mother   . Heart attack Father   . Stroke Father   . Hypertension Sister   . Diabetes Sister   . Rheumatic fever Sister   . Hypertension Brother   . Hypertension Brother   . Hypertension Brother   . Hypertension Sister   . Diabetes Sister     Social History   Socioeconomic History  . Marital status: Married    Spouse name: Not on file  . Number of children: Not on file  . Years of education: Not on file  . Highest education level: Not on file  Social Needs  . Financial resource strain: Not on file  . Food insecurity - worry: Not on file  . Food insecurity - inability: Not on file  . Transportation  needs - medical: Not on file  . Transportation needs - non-medical: Not on file  Occupational History  . Not on file  Tobacco Use  . Smoking status: Former Smoker    Last attempt to quit: 09/11/1998    Years since quitting: 19.1  Substance and Sexual Activity  . Alcohol use: No  . Drug use: No  . Sexual activity: Not on file  Other Topics Concern  . Not on file  Social History Narrative  . Not on file    Review of Systems: Positive for: GI: Described in detail in HPI.    Gen: anorexia, fatigue, weakness, malaise, involuntary weight loss,Denies any fever, chills, rigors, night sweats,  and sleep disorder CV: Denies chest pain, angina,  palpitations, syncope, orthopnea, PND, peripheral edema, and claudication. Resp: Denies dyspnea, cough, sputum, wheezing, coughing up blood. GU : Denies urinary burning, blood in urine, urinary frequency, urinary hesitancy, nocturnal urination, and urinary incontinence. MS: Denies joint pain or swelling.  Denies muscle weakness, cramps, atrophy.  Derm: Denies rash, itching, oral ulcerations, hives, unhealing ulcers.  Psych: Denies depression, anxiety, memory loss, suicidal ideation, hallucinations,  and confusion. Heme: Denies bruising and enlarged lymph nodes. Neuro:  Denies any headaches, dizziness, paresthesias. Endo:  DM,Denies any problems with , thyroid, adrenal function.  Physical Exam: Vital signs in last 24 hours: Temp:  [98 F (36.7 C)-98.3 F (36.8 C)] 98.3 F (36.8 C) (02/06 1509) Pulse Rate:  [62-69] 62 (02/06 1509) Resp:  [16-20] 16 (02/06 1509) BP: (103-125)/(49-66) 117/66 (02/06 1509) SpO2:  [99 %-100 %] 100 % (02/06 1342)    General:   Alert,  Well-developed, well-nourished, pleasant and cooperative in NAD Head:  Normocephalic and atraumatic. Eyes:  Deep icterus, mild pallor Ears:  Normal auditory acuity. Nose:  No deformity, discharge,  or lesions. Mouth:  No deformity or lesions.  Oropharynx pink & moist. Neck:  Supple;  no masses or thyromegaly. Lungs:  Clear throughout to auscultation.   No wheezes, crackles, or rhonchi. No acute distress. Heart:  Regular rate and rhythm; no murmurs, clicks, rubs,  or gallops. Extremities:  Without clubbing or edema. Neurologic:  Alert and  oriented x4;  grossly normal neurologically. No asterixis Skin:  Intact without significant lesions or rashes. Psych:  Alert and cooperative. Normal mood and affect. Abdomen:  Soft, nontender and nondistended. Small umbilical hernia  Normal bowel sounds, without guarding, and without rebound. No shifting dullness of fluid thrill         Lab Results: Recent Labs    10/17/17 1133  WBC 11.1*  HGB 7.4*  HCT 24.2*  PLT 398   BMET Recent Labs    10/17/17 1133  NA 129*  K 4.2  CL 102  CO2 16*  GLUCOSE 229*  BUN 84*  CREATININE 1.93*  CALCIUM 9.3   LFT Recent Labs    10/17/17 1133  PROT 7.6  ALBUMIN 2.4*  AST 171*  ALT 159*  ALKPHOS 1,004*  BILITOT 5.8*  BILIDIR 3.5*  IBILI 2.3*   PT/INR Recent Labs    10/17/17 1334  LABPROT 17.2*  INR 1.42    Studies/Results: Dg Chest 2 View  Result Date: 10/17/2017 CLINICAL DATA:  Mid chest pain and shortness of breath, history of liver metastatic disease EXAM: CHEST  2 VIEW COMPARISON:  None. FINDINGS: The heart size and mediastinal contours are within normal limits. Both lungs are clear. The visualized skeletal structures are unremarkable. IMPRESSION: No active cardiopulmonary disease. Electronically Signed   By: Inez Catalina M.D.   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 FOBT positive  Impaired renal function, hyponatremia, acidosis with bicarbonate 16, hyperglycemia blood sugar 229.  Plan: Oncology evaluation  for further management. Abnormal LFTs likely related to multiple hepatic metastases, poor prognosis. CEA, CA-19-9 and alpha-fetoprotein had been sent.  Nothing by mouth post midnight for possible EGD in a.m., Protonix 40 mg IV twice a day, H&H monitoring and transfuse as needed, keep hemoglobin above 7.     LOS: 0 days   Ronnette Juniper, M.D.  10/17/2017, 3:46 PM  Pager [phone] If no answer or after 5 PM call [phone]

## 2017-10-17 NOTE — ED Triage Notes (Addendum)
Pt reports she was diagnosed with liver cancer yesterday. Pt reports she has generalized weakness and decreased for the past 2 weeks. Pt reports she had labwork done yesterday and her creatinine was elevated. Pt was told by oncologist to come to ED for further eval. Pt not on chemo. Pt then adds she has chest pain.

## 2017-10-17 NOTE — Consult Note (Signed)
[facility] Consult  Referring Provider: Dr.Xu([facility]) Primary Care Physician:  Wenda Low, MD Primary Gastroenterologist: Dr.Johnson  Reason for Consultation:   1. Anemia/heart stools possible melena 2.Obstructive jaundice/primary pancreaticobiliary adenocarcinoma with liver metastases  HPI: Julie Barker is a 62 y.o. female was sent to the ED by her primary care physician as she was found to be dehydrated and had abnormal labs.  Patient was in her usual state of health until December 2018 when she developed early satiety and postprandial pain. She was seen by Dr. Barry Dienes for possible biliary colic and underwent imaging concerning for gallbladder mass with invasion into the liver(noted on CAT scan on 08/27/17). She subsequently had a liver biopsy on 09/21/17 which showed adenocarcinoma arising from primary pancreaticobiliary origin. MRI from 10/14/17 showed gallbladder adenocarcinoma and multiple 8 to send hepatic metastases, measuring between 1.6-2.1 cm, large nodal metastases and upper abdomen. No intra-or extrahepatic biliary dilatation noted on CAT scan from 08/17/17.  Patient has lost significant amount of weight and complains of loss of appetite. No prior EGD. Last colonoscopy a few years ago normal as per patient.  Patient has noted yellowish discoloration of her skin for the last 3-4 days, stools were chalky- white for the past several days before turning dark/black for the last 3 days. She takes baby aspirin daily and denies use of any other NSAIDs. She denies difficulty swallowing or pain on swallowing, denies heartburn and acid reflux.  Currently denies nausea, vomiting or abdominal pain.   Past Medical History:  Diagnosis Date  . Coronary artery disease    has diffuse distal vessel disease. Managed medically  . Diabetes mellitus   . Fibroid tumor   . Hyperlipidemia   . Hypertension   . Liver cancer [facility])     Past Surgical History:   Procedure Laterality Date  . CARDIAC CATHETERIZATION  04/05/2007   EF 75%.MODERATE 3 VESSEL CAD  . CARDIOVASCULAR STRESS TEST  02/28/2007   EF 53%. NO ISCHEMIA  . COLONOSCOPY WITH PROPOFOL N/A 05/31/2015   Procedure: COLONOSCOPY WITH PROPOFOL;  Surgeon: Garlan Fair, MD;  Location: [facility];  Service: Endoscopy;  Laterality: N/A;    Prior to Admission medications   Medication Sig Start Date End Date Taking? Authorizing Provider  acetaminophen (TYLENOL) 500 MG tablet Take 1,000 mg by mouth every 6 (six) hours as needed for mild pain or moderate pain.   Yes [provider]  amLODipine (NORVASC) 10 MG tablet Take 10 mg by mouth every morning.   Yes [provider]  aspirin 81 MG tablet Take 81 mg by mouth daily.     Yes [provider]  carvedilol (COREG) 25 MG tablet TAKE ONE TABLET BY MOUTH TWICE DAILY WITH MEALS 05/23/12  Yes Burtis Junes, NP  hydrochlorothiazide (HYDRODIURIL) 25 MG tablet Take 25 mg by mouth every morning.    Yes [provider]  insulin aspart (NOVOLOG FLEXPEN) 100 UNIT/ML FlexPen Inject 2-10 Units into the skin 2 (two) times daily. Per sliding scale   Yes [provider]  insulin glargine (LANTUS) 100 UNIT/ML injection Inject 25 Units into the skin 2 (two) times daily.    Yes [provider]  isosorbide mononitrate (IMDUR) 60 MG 24 hr tablet Take 60 mg by mouth every morning.    Yes [provider]  latanoprost (XALATAN) 0.005 % ophthalmic solution Place 1 drop into both eyes daily.    Yes [provider]  ramipril (ALTACE) 10 MG capsule Take 10 mg by  mouth 2 (two) times daily.     Yes [provider]  simvastatin (ZOCOR) 20 MG tablet Take 20 mg by mouth every morning.   Yes [provider]    Current Facility-Administered Medications  Medication Dose Route Frequency Provider Last Rate Last Dose  . 0.9 %  sodium chloride infusion   Intravenous Continuous Florencia Reasons, MD       . latanoprost (XALATAN) 0.005 % ophthalmic solution 1 drop  1 drop Both Eyes Daily Florencia Reasons, MD       Current Outpatient Medications  Medication Sig Dispense Refill  . acetaminophen (TYLENOL) 500 MG tablet Take 1,000 mg by mouth every 6 (six) hours as needed for mild pain or moderate pain.    Marland Kitchen amLODipine (NORVASC) 10 MG tablet Take 10 mg by mouth every morning.    Marland Kitchen aspirin 81 MG tablet Take 81 mg by mouth daily.      . carvedilol (COREG) 25 MG tablet TAKE ONE TABLET BY MOUTH TWICE DAILY WITH MEALS 180 tablet 0  . hydrochlorothiazide (HYDRODIURIL) 25 MG tablet Take 25 mg by mouth every morning.     . insulin aspart (NOVOLOG FLEXPEN) 100 UNIT/ML FlexPen Inject 2-10 Units into the skin 2 (two) times daily. Per sliding scale    . insulin glargine (LANTUS) 100 UNIT/ML injection Inject 25 Units into the skin 2 (two) times daily.     . isosorbide mononitrate (IMDUR) 60 MG 24 hr tablet Take 60 mg by mouth every morning.     . latanoprost (XALATAN) 0.005 % ophthalmic solution Place 1 drop into both eyes daily.     . ramipril (ALTACE) 10 MG capsule Take 10 mg by mouth 2 (two) times daily.      . simvastatin (ZOCOR) 20 MG tablet Take 20 mg by mouth every morning.      Allergies as of 10/17/2017  . (No Known Allergies)    Family History  Problem Relation Age of Onset  . Aneurysm Mother   . Heart attack Father   . Stroke Father   . Hypertension Sister   . Diabetes Sister   . Rheumatic fever Sister   . Hypertension Brother   . Hypertension Brother   . Hypertension Brother   . Hypertension Sister   . Diabetes Sister     Social History   Socioeconomic History  . Marital status: Married    Spouse name: Not on file  . Number of children: Not on file  . Years of education: Not on file  . Highest education level: Not on file  Social Needs  . Financial resource strain: Not on file  . Food insecurity - worry: Not on file  . Food insecurity - inability: Not on file  . Transportation  needs - medical: Not on file  . Transportation needs - non-medical: Not on file  Occupational History  . Not on file  Tobacco Use  . Smoking status: Former Smoker    Last attempt to quit: 09/11/1998    Years since quitting: 19.1  Substance and Sexual Activity  . Alcohol use: No  . Drug use: No  . Sexual activity: Not on file  Other Topics Concern  . Not on file  Social History Narrative  . Not on file    Review of Systems: Positive for: GI: Described in detail in HPI.    Gen: anorexia, fatigue, weakness, malaise, involuntary weight loss,Denies any fever, chills, rigors, night sweats,  and sleep disorder CV: Denies chest pain, angina,  palpitations, syncope, orthopnea, PND, peripheral edema, and claudication. Resp: Denies dyspnea, cough, sputum, wheezing, coughing up blood. GU : Denies urinary burning, blood in urine, urinary frequency, urinary hesitancy, nocturnal urination, and urinary incontinence. MS: Denies joint pain or swelling.  Denies muscle weakness, cramps, atrophy.  Derm: Denies rash, itching, oral ulcerations, hives, unhealing ulcers.  Psych: Denies depression, anxiety, memory loss, suicidal ideation, hallucinations,  and confusion. Heme: Denies bruising and enlarged lymph nodes. Neuro:  Denies any headaches, dizziness, paresthesias. Endo:  DM,Denies any problems with , thyroid, adrenal function.  Physical Exam: Vital signs in last 24 hours: Temp:  [98 F (36.7 C)-98.3 F (36.8 C)] 98.3 F (36.8 C) (02/06 1509) Pulse Rate:  [62-69] 62 (02/06 1509) Resp:  [16-20] 16 (02/06 1509) BP: (103-125)/(49-66) 117/66 (02/06 1509) SpO2:  [99 %-100 %] 100 % (02/06 1342)    General:   Alert,  Well-developed, well-nourished, pleasant and cooperative in NAD Head:  Normocephalic and atraumatic. Eyes:  Deep icterus, mild pallor Ears:  Normal auditory acuity. Nose:  No deformity, discharge,  or lesions. Mouth:  No deformity or lesions.  Oropharynx pink & moist. Neck:  Supple;  no masses or thyromegaly. Lungs:  Clear throughout to auscultation.   No wheezes, crackles, or rhonchi. No acute distress. Heart:  Regular rate and rhythm; no murmurs, clicks, rubs,  or gallops. Extremities:  Without clubbing or edema. Neurologic:  Alert and  oriented x4;  grossly normal neurologically. No asterixis Skin:  Intact without significant lesions or rashes. Psych:  Alert and cooperative. Normal mood and affect. Abdomen:  Soft, nontender and nondistended. Small umbilical hernia  Normal bowel sounds, without guarding, and without rebound. No shifting dullness of fluid thrill         Lab Results: Recent Labs    10/17/17 1133  WBC 11.1*  HGB 7.4*  HCT 24.2*  PLT 398   BMET Recent Labs    10/17/17 1133  NA 129*  K 4.2  CL 102  CO2 16*  GLUCOSE 229*  BUN 84*  CREATININE 1.93*  CALCIUM 9.3   LFT Recent Labs    10/17/17 1133  PROT 7.6  ALBUMIN 2.4*  AST 171*  ALT 159*  ALKPHOS 1,004*  BILITOT 5.8*  BILIDIR 3.5*  IBILI 2.3*   PT/INR Recent Labs    10/17/17 1334  LABPROT 17.2*  INR 1.42    Studies/Results: Dg Chest 2 View  Result Date: 10/17/2017 CLINICAL DATA:  Mid chest pain and shortness of breath, history of liver metastatic disease EXAM: CHEST  2 VIEW COMPARISON:  None. FINDINGS: The heart size and mediastinal contours are within normal limits. Both lungs are clear. The visualized skeletal structures are unremarkable. IMPRESSION: No active cardiopulmonary disease. Electronically Signed   By: Inez Catalina M.D.   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 FOBT positive  Impaired renal function, hyponatremia, acidosis with bicarbonate 16, hyperglycemia blood sugar 229.  Plan: Oncology evaluation  for further management. Abnormal LFTs likely related to multiple hepatic metastases, poor prognosis. CEA, CA-19-9 and alpha-fetoprotein had been sent.  Nothing by mouth post midnight for possible EGD in a.m., Protonix 40 mg IV twice a day, H&H monitoring and transfuse as needed, keep hemoglobin above 7.     LOS: 0 days   Ronnette Juniper, M.D.  10/17/2017, 3:46 PM  Pager [phone] If no answer or after 5 PM call [phone]

## 2017-10-17 NOTE — ED Notes (Signed)
ED TO INPATIENT HANDOFF REPORT  Name/Age/Gender Julie Barker 62 y.o. female  Code Status Code Status History    This patient does not have a recorded code status. Please follow your organizational policy for patients in this situation.      Home/SNF/Other Home  Chief Complaint cancer pt / abnormal labs   Level of Care/Admitting Diagnosis ED Disposition    ED Disposition Condition Comment   Admit  Hospital Area: [place] [[id number]]  Level of Care: Telemetry [5]  Admit to tele based on following criteria: Monitor for Ischemic changes  Diagnosis: Anemia [161096]  Admitting Physician: Florencia Reasons [[id number]]  Attending Physician: Florencia Reasons [[id number]]  Estimated length of stay: past midnight tomorrow  Certification:: I certify this patient will need inpatient services for at least 2 midnights  PT Class (Do Not Modify): Inpatient [101]  PT Acc Code (Do Not Modify): Private [1]       Medical History Past Medical History:  Diagnosis Date  . Coronary artery disease    has diffuse distal vessel disease. Managed medically  . Diabetes mellitus   . Fibroid tumor   . Hyperlipidemia   . Hypertension   . Liver cancer ([place])     Allergies No Known Allergies  IV Location/Drains/Wounds Patient Lines/Drains/Airways Status   Active Line/Drains/Airways    Name:   Placement date:   Placement time:   Site:   Days:   Peripheral IV 10/17/17 Left Forearm   10/17/17    1153    Forearm   less than 1          Labs/Imaging Results for orders placed or performed during the hospital encounter of 10/17/17 (from the past 48 hour(s))  Basic metabolic panel     Status: Abnormal   Collection Time: 10/17/17 11:33 AM  Result Value Ref Range   Sodium 129 (L) 135 - 145 mmol/L   Potassium 4.2 3.5 - 5.1 mmol/L   Chloride 102 101 - 111 mmol/L   CO2 16 (L) 22 - 32 mmol/L   Glucose, Bld 229 (H) 65 - 99 mg/dL   BUN 84 (H) 6 - 20 mg/dL   Creatinine, Ser 1.93 (H) 0.44 - 1.00  mg/dL   Calcium 9.3 8.9 - 10.3 mg/dL   GFR calc non Af Amer 26 (L) >60 mL/min   GFR calc Af Amer 31 (L) >60 mL/min    Comment: (NOTE) The eGFR has been calculated using the CKD EPI equation. This calculation has not been validated in all clinical situations. eGFR's persistently <60 mL/min signify possible Chronic Kidney Disease.    Anion gap 11 5 - 15    Comment: Performed at [facility], [place] [address], [place], [place] [address]  CBC     Status: Abnormal   Collection Time: 10/17/17 11:33 AM  Result Value Ref Range   WBC 11.1 (H) 4.0 - 10.5 K/uL   RBC 2.57 (L) 3.87 - 5.11 MIL/uL   Hemoglobin 7.4 (L) 12.0 - 15.0 g/dL   HCT 24.2 (L) 36.0 - 46.0 %   MCV 94.2 78.0 - 100.0 fL   MCH 28.8 26.0 - 34.0 pg   MCHC 30.6 30.0 - 36.0 g/dL   RDW 19.0 (H) 11.5 - 15.5 %   Platelets 398 150 - 400 K/uL    Comment: Performed at [facility], [place] [address], [place], [place] [address]  Hepatic function panel     Status: Abnormal   Collection Time: 10/17/17 11:33 AM  Result Value Ref Range   Total Protein 7.6 6.5 - 8.1 g/dL   Albumin 2.4 (L) 3.5 - 5.0 g/dL   AST 171 (H) 15 - 41 U/L   ALT 159 (H) 14 - 54 U/L   Alkaline Phosphatase 1,004 (H) 38 - 126 U/L   Total Bilirubin 5.8 (H) 0.3 - 1.2 mg/dL   Bilirubin, Direct 3.5 (H) 0.1 - 0.5 mg/dL   Indirect Bilirubin 2.3 (H) 0.3 - 0.9 mg/dL    Comment: Performed at [facility], [place] [address], [place], [place] [address]  Differential     Status: Abnormal   Collection Time: 10/17/17 11:33 AM  Result Value Ref Range   Neutrophils Relative % 78 %   Neutro Abs 8.6 (H) 1.7 - 7.7 K/uL   Lymphocytes Relative 9 %   Lymphs Abs 1.0 0.7 - 4.0 K/uL   Monocytes Relative 11 %   Monocytes Absolute 1.2 (H) 0.1 - 1.0 K/uL   Eosinophils Relative 2 %   Eosinophils Absolute 0.3 0.0 - 0.7 K/uL   Basophils Relative 0 %   Basophils Absolute 0.0 0.0 - 0.1 K/uL    Comment: Performed at [facility], [place] [address] [place], [place] [address]  I-stat troponin, ED     Status: None   Collection Time: 10/17/17 11:58 AM  Result Value Ref Range   Troponin i, poc 0.00 0.00 - 0.08 ng/mL   Comment 3            Comment: Due to the release kinetics of cTnI, a negative result within the first hours of the onset of symptoms does not rule out myocardial infarction with certainty. If myocardial infarction is still suspected, repeat the test at appropriate intervals.   Reticulocytes     Status: Abnormal   Collection Time: 10/17/17  1:19 PM  Result Value Ref Range   Retic Ct Pct 6.1 (H) 0.4 - 3.1 %   RBC. 2.34 (L) 3.87 - 5.11 MIL/uL   Retic Count, Absolute 142.7 19.0 - 186.0 K/uL    Comment: Performed at [facility], [place] [address] [place], [place] [address]  Type and screen [place]     Status: None (Preliminary result)   Collection Time: 10/17/17  1:21 PM  Result Value Ref Range   ABO/RH(D) AB POS    Antibody Screen NEG    Sample Expiration 10/20/2017    Unit Number [id number]    Blood Component Type RED CELLS,LR    Unit division 00    Status of Unit ISSUED    Transfusion Status OK TO TRANSFUSE    Crossmatch Result      Compatible Performed at [facility], [place] [address], [place], [place] [address]    Unit Number [id number]    Blood Component Type RED CELLS,LR    Unit division 00    Status of Unit ALLOCATED    Transfusion Status OK TO TRANSFUSE    Crossmatch Result Compatible   POC occult blood, ED     Status: Abnormal   Collection Time: 10/17/17  1:34 PM  Result Value Ref Range   Fecal Occult Bld POSITIVE (A) NEGATIVE  Protime-INR     Status: Abnormal   Collection Time: 10/17/17  1:34 PM  Result Value Ref Range   Prothrombin Time 17.2 (H) 11.4 - 15.2 seconds   INR 1.42     Comment: Performed at [facility], [place] [address] [place], [place]  [address]  APTT     Status: None    Collection Time: 10/17/17  1:34 PM  Result Value Ref Range   aPTT 35 24 - 36 seconds    Comment: Performed at [facility], [place] [address], [place], [place] [address]  ABO/Rh     Status: None (Preliminary result)   Collection Time: 10/17/17  1:39 PM  Result Value Ref Range   ABO/RH(D)      AB POS Performed at [facility], [place] [address], [place], [place] [address]   Prepare RBC     Status: None   Collection Time: 10/17/17  2:00 PM  Result Value Ref Range   Order Confirmation      ORDER PROCESSED BY BLOOD BANK Performed at [place] [address] [place], [place] [address]    Dg Chest 2 View  Result Date: 10/17/2017 CLINICAL DATA:  Mid chest pain and shortness of breath, history of liver metastatic disease EXAM: CHEST  2 VIEW COMPARISON:  None. FINDINGS: The heart size and mediastinal contours are within normal limits. Both lungs are clear. The visualized skeletal structures are unremarkable. IMPRESSION: No active cardiopulmonary disease. Electronically Signed   By: Inez Catalina M.D.   On: 10/17/2017 11:48    Pending Labs Unresulted Labs (From admission, onward)   Start     Ordered   10/18/17 0500  Save smear  Tomorrow morning,   R     10/17/17 1508   10/18/17 0500  CEA  Tomorrow morning,   R     10/17/17 1538   10/18/17 0500  AFP tumor marker  Tomorrow morning,   R     10/17/17 1538   10/18/17 0500  Cancer antigen 19-9  Tomorrow morning,   R     10/17/17 1538   10/17/17 1508  Haptoglobin  Add-on,   R     10/17/17 1507   10/17/17 1508  Lactate dehydrogenase  Add-on,   R     10/17/17 1507   10/17/17 1446  Urinalysis, Routine w reflex microscopic  Once,   R     10/17/17 1446   10/17/17 1319  Vitamin B12  (Anemia Panel (PNL))  STAT,   STAT     10/17/17 1320   10/17/17 1319  Folate  (Anemia Panel (PNL))  STAT,   STAT     10/17/17 1320   10/17/17 1319  Iron and TIBC  (Anemia Panel (PNL))  STAT,   STAT     10/17/17  1320   10/17/17 1319  Ferritin  (Anemia Panel (PNL))  STAT,   STAT     10/17/17 1320   Signed and Held  HIV antibody (Routine Testing)  Once,   R     Signed and Held   Signed and Held  CBC  Tomorrow morning,   R     Signed and Held   Signed and Held  Comprehensive metabolic panel  Tomorrow morning,   R     Signed and Held      Vitals/Pain Today's Vitals   10/17/17 1342 10/17/17 1454 10/17/17 1509 10/17/17 1546  BP: 125/66 (!) 107/49 117/66 135/62  Pulse: 63 69 62 70  Resp: '18 16 16 16  '$ Temp:  98.3 F (36.8 C) 98.3 F (36.8 C)   TempSrc:  Oral Oral   SpO2: 100%   99%  PainSc:        Isolation Precautions No active isolations  Medications Medications  latanoprost (XALATAN) 0.005 % ophthalmic solution 1 drop (not administered)  0.9 %  sodium chloride infusion (not administered)  pantoprazole (PROTONIX) injection 40 mg (not administered)  sodium bicarbonate tablet 650 mg (not administered)  carvedilol (COREG) tablet 3.125 mg (not administered)  sodium chloride 0.9 % bolus 1,000 mL (0 mLs Intravenous Stopped 10/17/17 1507)  0.9 %  sodium chloride infusion ( Intravenous New Bag/Given 10/17/17 1507)    Mobility non-ambulatory

## 2017-10-17 NOTE — Progress Notes (Signed)
Blood sugar monitoring device to L arm, deltoid. Device does not contain medication.

## 2017-10-17 NOTE — H&P (Signed)
History and Physical  Julie Barker YBO:[id number] DOB: Nov 26, 1955 DOA: 10/17/2017  Referring physician: EDP PCP: Wenda Low, MD   Chief Complaint: Anemia, weakness, n/v, decreased appetite   HPI: Julie Barker is a 62 y.o. female   History of insulin-dependent diabetes, hypertension, coronary artery disease, recently diagnosed primary pancreatobiliary adenocarcinoma (s/p biopsy in January).  Developed weakness,DOE, Decreased oral intake, n/v. Last vomited a few days ago, denies hematemsis.  She reports stool was light color then became dark a few days ago. She reports right sided abdominal pain only when she lays on right side, she reports central chest pain with eating, otherwise no chest pain. she denies fever. No cough, no edema.  she was evaluated by primary care doctor yesterday, found to have anemia, dehydration and  elevated creatinine, she is instructed to come to the ED.  ED course: Vital signs are stable.  Denies pain.  Hemoglobin 7.4, dropped from 10.6 a few weeks ago.  Platelet 398, INR 1.4.  Sodium 129, BUN 84, creatinine 1.93, lak phos 1000, AST 171, ALT 159, total bilirubin 5.8.  No baseline labs.  Troponin negative.  EKG no acute changes.  Stool guaiac positive.  She received 1 L of normal saline, 2 units PRBC ordered in the ED. Hospitalist called to admit the patient.  Review of Systems:  Detail per HPI, Review of systems are otherwise negative  Past Medical History:  Diagnosis Date  . Coronary artery disease    has diffuse distal vessel disease. Managed medically  . Diabetes mellitus   . Fibroid tumor   . Hyperlipidemia   . Hypertension   . Liver cancer [facility])    Past Surgical History:  Procedure Laterality Date  . CARDIAC CATHETERIZATION  04/05/2007   EF 75%.MODERATE 3 VESSEL CAD  . CARDIOVASCULAR STRESS TEST  02/28/2007   EF 53%. NO ISCHEMIA  . COLONOSCOPY WITH PROPOFOL N/A 05/31/2015   Procedure: COLONOSCOPY WITH PROPOFOL;  Surgeon: Garlan Fair, MD;  Location: [facility];  Service: Endoscopy;  Laterality: N/A;   Social History:  reports that she quit smoking about 19 years ago. She does not have any smokeless tobacco history on file. She reports that she does not drink alcohol or use drugs. Patient lives at home & is able to participate in activities of daily living independently   No Known Allergies  Family History  Problem Relation Age of Onset  . Aneurysm Mother   . Heart attack Father   . Stroke Father   . Hypertension Sister   . Diabetes Sister   . Rheumatic fever Sister   . Hypertension Brother   . Hypertension Brother   . Hypertension Brother   . Hypertension Sister   . Diabetes Sister       Prior to Admission medications   Medication Sig Start Date End Date Taking? Authorizing Provider  acetaminophen (TYLENOL) 500 MG tablet Take 1,000 mg by mouth every 6 (six) hours as needed for mild pain or moderate pain.   Yes [provider]  amLODipine (NORVASC) 10 MG tablet Take 10 mg by mouth every morning.   Yes [provider]  aspirin 81 MG tablet Take 81 mg by mouth daily.     Yes [provider]  carvedilol (COREG) 25 MG tablet TAKE ONE TABLET BY MOUTH TWICE DAILY WITH MEALS 05/23/12  Yes Burtis Junes, NP  hydrochlorothiazide (HYDRODIURIL) 25 MG tablet Take 25 mg by mouth every morning.    Yes [provider]  insulin aspart (NOVOLOG FLEXPEN) 100 UNIT/ML FlexPen Inject 2-10 Units into the skin 2 (two) times daily. Per sliding scale   Yes [provider]  insulin glargine (LANTUS) 100 UNIT/ML injection Inject 25 Units into the skin 2 (two) times daily.    Yes [provider]  isosorbide mononitrate (IMDUR) 60 MG 24 hr tablet Take 60 mg by mouth every morning.    Yes [provider]  latanoprost (XALATAN) 0.005 % ophthalmic solution Place 1 drop into both eyes daily.    Yes [provider]  ramipril (ALTACE) 10 MG capsule Take 10 mg by  mouth 2 (two) times daily.     Yes [provider]  simvastatin (ZOCOR) 20 MG tablet Take 20 mg by mouth every morning.   Yes [provider]    Physical Exam: BP 125/66   Pulse 63   Temp 98 F (36.7 C) (Oral)   Resp 18   SpO2 100%   General:  NAD, jaundice  Eyes: PERRL ENT: unremarkable Neck: supple, no JVD Cardiovascular: RRR Respiratory: CTABL Abdomen: protuberant, soft/NT/ND, positive bowel sounds. Small umbilical hernia (report chronic) , reducible Skin: no rash Musculoskeletal:  No edema Psychiatric: calm/cooperative Neurologic: no focal findings            Labs on Admission:  Basic Metabolic Panel: Recent Labs  Lab 10/17/17 1133  NA 129*  K 4.2  CL 102  CO2 16*  GLUCOSE 229*  BUN 84*  CREATININE 1.93*  CALCIUM 9.3   Liver Function Tests: Recent Labs  Lab 10/17/17 1133  AST 171*  ALT 159*  ALKPHOS 1,004*  BILITOT 5.8*  PROT 7.6  ALBUMIN 2.4*   No results for input(s): LIPASE, AMYLASE in the last 168 hours. No results for input(s): AMMONIA in the last 168 hours. CBC: Recent Labs  Lab 10/17/17 1133  WBC 11.1*  NEUTROABS 8.6*  HGB 7.4*  HCT 24.2*  MCV 94.2  PLT 398   Cardiac Enzymes: No results for input(s): CKTOTAL, CKMB, CKMBINDEX, TROPONINI in the last 168 hours.  BNP (last 3 results) No results for input(s): BNP in the last 8760 hours.  ProBNP (last 3 results) No results for input(s): PROBNP in the last 8760 hours.  CBG: No results for input(s): GLUCAP in the last 168 hours.  Radiological Exams on Admission: Dg Chest 2 View  Result Date: 10/17/2017 CLINICAL DATA:  Mid chest pain and shortness of breath, history of liver metastatic disease EXAM: CHEST  2 VIEW COMPARISON:  None. FINDINGS: The heart size and mediastinal contours are within normal limits. Both lungs are clear. The visualized skeletal structures are unremarkable. IMPRESSION: No active cardiopulmonary disease. Electronically Signed   By: Inez Catalina  M.D.   On: 10/17/2017 11:48    EKG: Independently reviewed.  Sinus rhythm, left atrial enlargement, no acute ST-T changes  Assessment/Plan Present on Admission: **None**   Symptomatic anemia: -Likely from GI loss, FOBT+.   -INR 1.7, plt wnl. She does has elevated tbili likely from the cancer.  less likely hemolysis , will check ldh , haptoglobin. Save smear. -prbc transfusion x2unit started in the ED -GI Dr Therisa Doyne consulted, possible EGD in am, will follow GI recommendation.   Hyponatremia most likely from dehydration continue IV fluids  Elevated BUN/creatinine, no baseline,  -CT abdomen in December did show atrophic right kidney and left-sided kidney stone.  No hydronephrosis.  She denies of back pain.  UA pending collection. -Hold ACE inhibitor and HCTZ -Continue IV fluids, renal dosing meds  Metabolic acidosis, likely from renal failure, start sodium bicarb supplement  Insulin-dependent type 2 diabetes,   poor oral intake, hold long-acting insulin, start SSI.  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 Benay Spice who will see patient tomorrow.  HTN/CAD: -BP low normal, troponin negative, no acute EKG changes, no chest pain -Continue low-dose Coreg with holding parameters, hold norvasc/imdur/altace -hold aspirin/statin in the setting of gi bleed and elevated lft.   DVT prophylaxis: scd's  Consultants:  [facility] Dr Therisa Doyne  Oncology Dr Benay Spice   Code Status: full   Family Communication:  Patient and family at bedside  Disposition Plan: admit to med tele  Time spent: 59mins  Florencia Reasons MD, PhD [facility] Pager [phone] If 7PM-7AM, please contact night-coverage at  [email or web address], password [email or web address]

## 2017-10-17 NOTE — ED Provider Notes (Signed)
[place] DEPT Provider Note   CSN: [id number] Arrival date & time: 10/17/17  1055     History   Chief Complaint Chief Complaint  Patient presents with  . Weakness  . Abnormal Lab    HPI Julie Barker is a 62 y.o. female.  HPI   Patient is a 62 year old female with a history of coronary artery disease, diabetes mellitus, hyperlipidemia, and recently diagnosed mass of liver cancer (biopsy on 09/21/2017, MRI showing mass originating in gallbladder with mets to the upper abdomen) presenting at the request of her primary care provider regarding abnormal labs.  Patient was told that she was anemic and had elevated creatinine and BUN.  Patient reports that she has been having progressive shortness of breath over the past 2-3 weeks.  This is mostly with exertion.  Patient reports she will occasionally gets some chest pain even at rest, however it resolved spontaneously.  Patient reports she has had poor appetite over the past month.  Patient denies any fevers or chills, however she reports that she is noticed some night sweats over the past week.  Patient denies any abdominal pain or swelling.  Patient reports that immediately after the biopsy, she noted that she had some lighter stools, however she reports she has not had a stool in several days and cannot assess whether she has had any melena or hematochezia.  Past Medical History:  Diagnosis Date  . Coronary artery disease    has diffuse distal vessel disease. Managed medically  . Diabetes mellitus   . Fibroid tumor   . Hyperlipidemia   . Hypertension   . Liver cancer [facility])     Patient Active Problem List   Diagnosis Date Noted  . Liver cancer ([place])   . CAD (coronary artery disease) 08/02/2011  . HTN (hypertension) 08/02/2011  . Hyperlipidemia 08/02/2011    Past Surgical History:  Procedure Laterality Date  . CARDIAC CATHETERIZATION  04/05/2007   EF 75%.MODERATE 3 VESSEL CAD  .  CARDIOVASCULAR STRESS TEST  02/28/2007   EF 53%. NO ISCHEMIA  . COLONOSCOPY WITH PROPOFOL N/A 05/31/2015   Procedure: COLONOSCOPY WITH PROPOFOL;  Surgeon: Garlan Fair, MD;  Location: [facility];  Service: Endoscopy;  Laterality: N/A;    OB History    No data available       Home Medications    Prior to Admission medications   Medication Sig Start Date End Date Taking? Authorizing Provider  acetaminophen (TYLENOL) 500 MG tablet Take 1,000 mg by mouth every 6 (six) hours as needed for mild pain or moderate pain.   Yes [provider]  amLODipine (NORVASC) 10 MG tablet Take 10 mg by mouth every morning.   Yes [provider]  aspirin 81 MG tablet Take 81 mg by mouth daily.     Yes [provider]  carvedilol (COREG) 25 MG tablet TAKE ONE TABLET BY MOUTH TWICE DAILY WITH MEALS 05/23/12  Yes Burtis Junes, NP  hydrochlorothiazide (HYDRODIURIL) 25 MG tablet Take 25 mg by mouth every morning.    Yes [provider]  insulin aspart (NOVOLOG FLEXPEN) 100 UNIT/ML FlexPen Inject 2-10 Units into the skin 2 (two) times daily. Per sliding scale   Yes [provider]  insulin glargine (LANTUS) 100 UNIT/ML injection Inject 25 Units into the skin 2 (two) times daily.    Yes [provider]  isosorbide mononitrate (IMDUR) 60 MG 24 hr tablet Take 60 mg by mouth every morning.  Yes [provider]  latanoprost (XALATAN) 0.005 % ophthalmic solution Place 1 drop into both eyes daily.    Yes [provider]  ramipril (ALTACE) 10 MG capsule Take 10 mg by mouth 2 (two) times daily.     Yes [provider]  simvastatin (ZOCOR) 20 MG tablet Take 20 mg by mouth every morning.   Yes [provider]    Family History Family History  Problem Relation Age of Onset  . Aneurysm Mother   . Heart attack Father   . Stroke Father   . Hypertension Sister   . Diabetes Sister   . Rheumatic fever Sister   . Hypertension  Brother   . Hypertension Brother   . Hypertension Brother   . Hypertension Sister   . Diabetes Sister     Social History Social History   Tobacco Use  . Smoking status: Former Smoker    Last attempt to quit: 09/11/1998    Years since quitting: 19.1  Substance Use Topics  . Alcohol use: No  . Drug use: No     Allergies   Patient has no known allergies.   Review of Systems Review of Systems  Constitutional: Negative for chills and fever.  HENT: Negative for congestion and rhinorrhea.   Eyes: Negative for visual disturbance.  Respiratory: Positive for shortness of breath. Negative for cough and chest tightness.   Cardiovascular: Negative for chest pain and leg swelling.  Gastrointestinal: Positive for nausea. Negative for abdominal distention, abdominal pain, blood in stool and vomiting.  Genitourinary: Negative for dysuria.  Musculoskeletal: Negative for arthralgias.  Skin: Negative for color change.  Neurological: Positive for light-headedness.     Physical Exam Updated Vital Signs BP 125/66   Pulse 63   Temp 98 F (36.7 C) (Oral)   Resp 18   SpO2 100%   Physical Exam  Constitutional: She appears well-developed and well-nourished. No distress.  HENT:  Head: Normocephalic and atraumatic.  Mucous members are pale.  Eyes: Conjunctivae and EOM are normal. Pupils are equal, round, and reactive to light. Scleral icterus is present.  Neck: Normal range of motion. Neck supple.  Cardiovascular: Normal rate, regular rhythm, S1 normal and S2 normal.  No murmur heard. No lower extremity edema.  Pulmonary/Chest: Effort normal and breath sounds normal. She has no wheezes. She has no rales.  Abdominal: Soft. She exhibits no distension. There is no tenderness. There is no guarding.  Small umbilical hernia present.  Genitourinary:  Genitourinary Comments: Rectal exam performed with nurse tech chaperone present.  There are 2-3 prolapsed internal hemorrhoids without bleeding  at the anal verge.  No thrombosed external hemorrhoids.  No masses palpated in rectum.  Small amount of soft, dark stool in rectal vault.  Patient exhibits normal rectal tone.  Musculoskeletal: Normal range of motion. She exhibits no edema or deformity.  Lymphadenopathy:    She has no cervical adenopathy.  Neurological: She is alert.  Cranial nerves grossly intact. Patient moves extremities symmetrically and with good coordination.  Skin: Skin is warm and dry. No rash noted. No erythema.  Psychiatric: She has a normal mood and affect. Her behavior is normal. Judgment and thought content normal.  Nursing note and vitals reviewed.    ED Treatments / Results  Labs (all labs ordered are listed, but only abnormal results are displayed) Labs Reviewed  BASIC METABOLIC PANEL - Abnormal; Notable for the following components:      Result Value   Sodium 129 (*)  CO2 16 (*)    Glucose, Bld 229 (*)    BUN 84 (*)    Creatinine, Ser 1.93 (*)    GFR calc non Af Amer 26 (*)    GFR calc Af Amer 31 (*)    All other components within normal limits  CBC - Abnormal; Notable for the following components:   WBC 11.1 (*)    RBC 2.57 (*)    Hemoglobin 7.4 (*)    HCT 24.2 (*)    RDW 19.0 (*)    All other components within normal limits  HEPATIC FUNCTION PANEL - Abnormal; Notable for the following components:   Albumin 2.4 (*)    AST 171 (*)    ALT 159 (*)    Alkaline Phosphatase 1,004 (*)    Total Bilirubin 5.8 (*)    Bilirubin, Direct 3.5 (*)    Indirect Bilirubin 2.3 (*)    All other components within normal limits  DIFFERENTIAL - Abnormal; Notable for the following components:   Neutro Abs 8.6 (*)    Monocytes Absolute 1.2 (*)    All other components within normal limits  RETICULOCYTES - Abnormal; Notable for the following components:   Retic Ct Pct 6.1 (*)    RBC. 2.34 (*)    All other components within normal limits  PROTIME-INR - Abnormal; Notable for the following components:    Prothrombin Time 17.2 (*)    All other components within normal limits  APTT  VITAMIN B12  FOLATE  IRON AND TIBC  FERRITIN  I-STAT TROPONIN, ED  POC OCCULT BLOOD, ED  TYPE AND SCREEN  PREPARE RBC (CROSSMATCH)    EKG  EKG Interpretation  Date/Time:  Wednesday October 17 2017 11:17:08 EST Ventricular Rate:  63 PR Interval:    QRS Duration: 118 QT Interval:  441 QTC Calculation: 452 R Axis:   50 Text Interpretation:  Sinus rhythm Consider left atrial enlargement Nonspecific intraventricular conduction delay Low voltage, precordial leads No significant change since last tracing Confirmed by Dorie Rank [phone]) on 10/17/2017 11:48:52 AM       Radiology Dg Chest 2 View  Result Date: 10/17/2017 CLINICAL DATA:  Mid chest pain and shortness of breath, history of liver metastatic disease EXAM: CHEST  2 VIEW COMPARISON:  None. FINDINGS: The heart size and mediastinal contours are within normal limits. Both lungs are clear. The visualized skeletal structures are unremarkable. IMPRESSION: No active cardiopulmonary disease. Electronically Signed   By: Inez Catalina M.D.   On: 10/17/2017 11:48    Procedures Procedures (including critical care time)  Medications Ordered in ED Medications  0.9 %  sodium chloride infusion (not administered)  sodium chloride 0.9 % bolus 1,000 mL (1,000 mLs Intravenous New Bag/Given 10/17/17 1358)     Initial Impression / Assessment and Plan / ED Course  I have reviewed the triage vital signs and the nursing notes.  Pertinent labs & imaging results that were available during my care of the patient were reviewed by me and considered in my medical decision making (see chart for details).     Final Clinical Impressions(s) / ED Diagnoses   Final diagnoses:  Positive fecal occult blood test  Symptomatic anemia   Patient is nontoxic-appearing, afebrile, and in no acute distress.  Patient is hemodynamically stable with a heart rate of 63 blood pressure  105/66.  Patient demonstrating hemoglobin of 7.4, which is down from 10.65-monthago when she had her liver biopsy.  Additionally, patient exhibits acute kidney injury with creatinine  of 1.3 and BUN of 84.  Hemoccult is positive.  Suspect elevated BUN may be due to upper GI bleed versus prerenal cause of AK I and dehydration.  Patient has significantly elevated liver enzymes including alk phos of 1004, ALT 159, AST 171.  Patient has no abdominal pain or fever to suggest acute cholangitis.    Will initiate fluid repletion and blood transfusion at this time and admit.  Per Dr. Erlinda Hong of [facility], will pursue GI consult and will admit the patient.   3:10 PM Spoke with Dr. Therisa Doyne of GI, who recommends GI involvement for endoscopy for GI bleed. Does not think that pt is candidate for ERCP and requires oncology f/u (pt has referral to Dr. Benay Spice).  This is a shared visit with Dr. Dorie Rank. Patient was independently evaluated by this attending physician. Attending physician consulted in evaluation and admission management.  ED Discharge Orders    None       Tamala Julian 10/17/17 1841    Albesa Seen, PA-C 10/17/17 1844    Albesa Seen, PA-C 10/17/17 1846    Dorie Rank, MD 10/18/17 [phone]

## 2017-10-18 ENCOUNTER — Inpatient Hospital Stay (HOSPITAL_COMMUNITY): Payer: BLUE CROSS/BLUE SHIELD | Admitting: Anesthesiology

## 2017-10-18 ENCOUNTER — Encounter (HOSPITAL_COMMUNITY): Payer: Self-pay | Admitting: *Deleted

## 2017-10-18 ENCOUNTER — Encounter (HOSPITAL_COMMUNITY)
Admission: EM | Disposition: A | Discharge status: Home / Self Care | Payer: Self-pay | Source: Home / Self Care | Attending: Internal Medicine

## 2017-10-18 DIAGNOSIS — D649 Anemia, unspecified: Secondary | ICD-10-CM | POA: Diagnosis present

## 2017-10-18 DIAGNOSIS — Z794 Long term (current) use of insulin: Secondary | ICD-10-CM

## 2017-10-18 DIAGNOSIS — K269 Duodenal ulcer, unspecified as acute or chronic, without hemorrhage or perforation: Secondary | ICD-10-CM

## 2017-10-18 DIAGNOSIS — E871 Hypo-osmolality and hyponatremia: Secondary | ICD-10-CM | POA: Diagnosis present

## 2017-10-18 DIAGNOSIS — I10 Essential (primary) hypertension: Secondary | ICD-10-CM

## 2017-10-18 DIAGNOSIS — I25119 Atherosclerotic heart disease of native coronary artery with unspecified angina pectoris: Secondary | ICD-10-CM

## 2017-10-18 DIAGNOSIS — K2210 Ulcer of esophagus without bleeding: Secondary | ICD-10-CM

## 2017-10-18 DIAGNOSIS — E1165 Type 2 diabetes mellitus with hyperglycemia: Secondary | ICD-10-CM

## 2017-10-18 DIAGNOSIS — E119 Type 2 diabetes mellitus without complications: Secondary | ICD-10-CM

## 2017-10-18 HISTORY — DX: Type 2 diabetes mellitus without complications: E11.9

## 2017-10-18 HISTORY — DX: Duodenal ulcer, unspecified as acute or chronic, without hemorrhage or perforation: K26.9

## 2017-10-18 HISTORY — PX: ESOPHAGOGASTRODUODENOSCOPY (EGD) WITH PROPOFOL: SHX5813

## 2017-10-18 HISTORY — DX: Ulcer of esophagus without bleeding: K22.10

## 2017-10-18 LAB — TYPE AND SCREEN
ABO/RH(D): AB POS
ANTIBODY SCREEN: NEGATIVE
UNIT DIVISION: 0
UNIT DIVISION: 0

## 2017-10-18 LAB — COMPREHENSIVE METABOLIC PANEL
ALBUMIN: 2.3 g/dL — AB (ref 3.5–5.0)
ALK PHOS: 1095 U/L — AB (ref 38–126)
ALT: 170 U/L — ABNORMAL HIGH (ref 14–54)
ANION GAP: 7 (ref 5–15)
AST: 208 U/L — ABNORMAL HIGH (ref 15–41)
BUN: 63 mg/dL — ABNORMAL HIGH (ref 6–20)
CHLORIDE: 110 mmol/L (ref 101–111)
CO2: 18 mmol/L — ABNORMAL LOW (ref 22–32)
Calcium: 9.5 mg/dL (ref 8.9–10.3)
Creatinine, Ser: 1.42 mg/dL — ABNORMAL HIGH (ref 0.44–1.00)
GFR calc non Af Amer: 38 mL/min — ABNORMAL LOW (ref >60)
GFR, EST AFRICAN AMERICAN: 45 mL/min — AB (ref >60)
GLUCOSE: 170 mg/dL — AB (ref 65–99)
POTASSIUM: 4.6 mmol/L (ref 3.5–5.1)
SODIUM: 135 mmol/L (ref 135–145)
Total Bilirubin: 6.2 mg/dL — ABNORMAL HIGH (ref 0.3–1.2)
Total Protein: 7.4 g/dL (ref 6.5–8.1)

## 2017-10-18 LAB — BPAM RBC
BLOOD PRODUCT EXPIRATION DATE: 201902222359
BLOOD PRODUCT EXPIRATION DATE: 201902222359
ISSUE DATE / TIME: 201902061736
ISSUE DATE / TIME: 201902061439
Unit Type and Rh: 6200
Unit Type and Rh: 6200

## 2017-10-18 LAB — CBC
HCT: 30.7 % — ABNORMAL LOW (ref 36.0–46.0)
HEMOGLOBIN: 9.9 g/dL — AB (ref 12.0–15.0)
MCH: 29.3 pg (ref 26.0–34.0)
MCHC: 32.2 g/dL (ref 30.0–36.0)
MCV: 90.8 fL (ref 78.0–100.0)
PLATELETS: 411 10*3/uL — AB (ref 150–400)
RBC: 3.38 MIL/uL — ABNORMAL LOW (ref 3.87–5.11)
RDW: 18.5 % — ABNORMAL HIGH (ref 11.5–15.5)
WBC: 12.5 10*3/uL — ABNORMAL HIGH (ref 4.0–10.5)

## 2017-10-18 LAB — HIV ANTIBODY (ROUTINE TESTING W REFLEX): HIV SCREEN 4TH GENERATION: NONREACTIVE

## 2017-10-18 LAB — HAPTOGLOBIN: Haptoglobin: 225 mg/dL — ABNORMAL HIGH (ref 34–200)

## 2017-10-18 LAB — GLUCOSE, CAPILLARY
Glucose-Capillary: 163 mg/dL — ABNORMAL HIGH (ref 65–99)
Glucose-Capillary: 175 mg/dL — ABNORMAL HIGH (ref 65–99)
Glucose-Capillary: 197 mg/dL — ABNORMAL HIGH (ref 65–99)
Glucose-Capillary: 289 mg/dL — ABNORMAL HIGH (ref 65–99)
Glucose-Capillary: 263 mg/dL — ABNORMAL HIGH (ref 65–99)

## 2017-10-18 LAB — SAVE SMEAR

## 2017-10-18 SURGERY — ESOPHAGOGASTRODUODENOSCOPY (EGD) WITH PROPOFOL
Anesthesia: Monitor Anesthesia Care | Laterality: Left

## 2017-10-18 MED ORDER — PROPOFOL 10 MG/ML IV BOLUS
INTRAVENOUS | Status: AC
  Filled 2017-10-18: qty 40

## 2017-10-18 MED ORDER — SODIUM CHLORIDE 0.9 % IV SOLN
8.0000 mg/h | INTRAVENOUS | Status: DC
  Administered 2017-10-18 – 2017-10-20 (×4): 8 mg/h via INTRAVENOUS
  Filled 2017-10-18 (×7): qty 80

## 2017-10-18 MED ORDER — LACTATED RINGERS IV SOLN
INTRAVENOUS | Status: DC
  Administered 2017-10-18: 07:00:00 via INTRAVENOUS

## 2017-10-18 MED ORDER — PANTOPRAZOLE SODIUM 40 MG IV SOLR: 40.0000 mg | Freq: Two times a day (BID) | INTRAVENOUS | Status: DC

## 2017-10-18 MED ORDER — SODIUM CHLORIDE 0.9 % IV SOLN
80.0000 mg | Freq: Once | INTRAVENOUS | Status: AC
  Administered 2017-10-18: 80 mg via INTRAVENOUS
  Filled 2017-10-18: qty 80

## 2017-10-18 MED ORDER — PROPOFOL 10 MG/ML IV BOLUS
INTRAVENOUS | Status: DC | PRN
  Administered 2017-10-18: 20 mg via INTRAVENOUS

## 2017-10-18 MED ORDER — LIDOCAINE 2% (20 MG/ML) 5 ML SYRINGE
INTRAMUSCULAR | Status: DC | PRN
  Administered 2017-10-18: 100 mg via INTRAVENOUS

## 2017-10-18 MED ORDER — ONDANSETRON HCL 4 MG/2ML IJ SOLN: 4.0000 mg | Freq: Once | INTRAMUSCULAR | Status: DC

## 2017-10-18 MED ORDER — EPINEPHRINE PF 1 MG/10ML IJ SOSY
PREFILLED_SYRINGE | INTRAMUSCULAR | Status: DC | PRN
  Administered 2017-10-18: 305 mL

## 2017-10-18 MED ORDER — ONDANSETRON HCL 4 MG/2ML IJ SOLN
INTRAMUSCULAR | Status: AC
  Filled 2017-10-18: qty 2

## 2017-10-18 MED ORDER — ONDANSETRON HCL 4 MG/2ML IJ SOLN
INTRAMUSCULAR | Status: DC | PRN
Start: 2017-10-18 — End: 2017-10-18
  Administered 2017-10-18: 4 mg via INTRAVENOUS
  Administered 2017-10-18: 4 mg

## 2017-10-18 MED ORDER — SODIUM CHLORIDE 0.9 % IV SOLN: INTRAVENOUS | Status: DC

## 2017-10-18 MED ORDER — PROPOFOL 500 MG/50ML IV EMUL
INTRAVENOUS | Status: DC | PRN
  Administered 2017-10-18: 125 ug/kg/min via INTRAVENOUS

## 2017-10-18 MED ORDER — EPINEPHRINE PF 1 MG/10ML IJ SOSY
PREFILLED_SYRINGE | INTRAMUSCULAR | Status: AC
Start: 2017-10-18 — End: ?
  Filled 2017-10-18: qty 10

## 2017-10-18 NOTE — Transfer of Care (Signed)
Immediate Anesthesia Transfer of Care Note  Patient: Julie Barker  Procedure(s) Performed: ESOPHAGOGASTRODUODENOSCOPY (EGD) WITH PROPOFOL (Left )  Patient Location: PACU and Endoscopy Unit  Anesthesia Type:MAC  Level of Consciousness: awake, alert  and oriented  Airway & Oxygen Therapy: Patient Spontanous Breathing and Patient connected to nasal cannula oxygen  Post-op Assessment: Report given to RN and Post -op Vital signs reviewed and stable  Post vital signs: Reviewed and stable  Last Vitals:  Vitals:   10/18/17 0636 10/18/17 0715  BP: 132/64 (!) 141/68  Pulse: 70 67  Resp: 16 18  Temp: 37.3 C 37.1 C  SpO2: 100% 99%    Last Pain:  Vitals:   10/18/17 0715  TempSrc: Oral  PainSc:          Complications: No apparent anesthesia complications

## 2017-10-18 NOTE — Progress Notes (Signed)
PROGRESS NOTE    Julie Barker  WUJ:[id number] DOB: September 20, 1955 DOA: 10/17/2017 PCP: Wenda Low, MD    Brief Narrative:  Patient is 62 year old female history of insulin-dependent diabetes, hypertension, coronary artery disease recently diagnosed with primary pancreatobiliary adenocarcinoma who presents to the ED with generalized weakness, shortness of breath, decreased oral intake, nausea vomiting, questionable melanotic stools.  Patient seen at PCPs office noted to be anemic and dehydrated with a worsening renal function and sent to the ED.  On presentation to the ED patient was noted to have a hemoglobin of 7.4, elevated alk phosphatase, AST and ALT as well as bilirubin.  FOBT was positive.  GI consulted and patient underwent upper endoscopy 10/18/2017.  Oncology consultation pending.   Assessment & Plan:   Principal Problem:   Symptomatic anemia Active Problems:   Duodenal ulcer: Per EGD 10/18/2017   Esophageal ulceration: EGD 10/18/2017   CAD (coronary artery disease)   HTN (hypertension)   Hyperlipidemia   Anemia   Type 2 diabetes mellitus without complication (HCC)   Hyponatremia   1 symptomatic anemia secondary to duodenal ulcer and esophageal ulceration per EGD of 10/18/2017. Patient Presented with traumatic anemia including shortness of breath and fatigue, generalized weakness, oral intake.  Patient was noted on admission to have a hemoglobin of 7.4 with a drop from 10.6 a few weeks ago.  Patient status post 2 units packed red blood cells hemoglobin currently stable at 9.9.  Patient currently on a Protonix drip.  Patient status post upper endoscopy done per Dr. Therisa Doyne 10/18/2017 which showed superficial linear ulceration noted in the distal esophagus biopsies taken and sent.  Large 3 cm partially obstructive deep cratered ulcer noted at the duodenal bulb with small clot, pigmentation otherwise clean base.  Biopsies sent to rule out malignancy as patient noted to have a history of  gallbladder cancer and liver metastases.  Patient currently on a clear liquid diet.  Follow H&H.  Transfusion threshold hemoglobin less than 7.  Perigastric serologies due to size of ulcer and increased chance of rebleeding recommending continue IV PPI for total of 72 hours prior to switching to twice daily.  GI following and appreciate input and recommendations.  2.  Hyponatremia Secondary to hypovolemic hyponatremia.  Improving with hydration.  Follow.  3.  Insulin dependent type 2 diabetes Check a hemoglobin A1c.  CBGs ranging from 163-289.  Continue sliding scale insulin.  4.  Hypertension/CAD Blood pressure currently stable.  Continue Coreg.  ACE inhibitor and diuretic on hold secondary to acute renal failure.  5.  Hyperlipidemia Hold statin secondary to elevated LFTs.  6.  Acute renal failure Secondary to prerenal azotemia in the setting of ACE inhibitor and diuretic.  ACE inhibitor and diuretic on hold.  Renal function improving with hydration.  Follow.  7.  Metabolic acidosis Continue bicarb tablets.  8.  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  Patient with a transaminitis on admission.  CEA, AFP, CA-19-9 ordered and pending.  Oncology consultation pending.     DVT prophylaxis: SCDs Code Status: Full Family Communication: Updated Patient and family at bedside. Disposition Plan: To be determined.   Consultants:   Gastroenterology: Dr. Therisa Doyne 10/17/2017  Oncology pending  Procedures:   Upper endoscopy--superficial linear ulceration noted in the distal esophagus, large 3 cm partially obstructing deep cratered ulcer noted in the duodenal  bulb with small clot, pigmentation otherwise clean base.  Biopsies taken.  Status post injection of 4 mL of epinephrine  and bipolar cautery per Dr. Therisa Doyne 10/18/2017.  Transfusion of 2 units packed red blood cells 10/17/2017  Chest x-ray 10/17/2017    Antimicrobials:   None   Subjective: Lining bed.  Patient denies any chest pain.  Patient still with some abdominal pain.  Patient states unable to state at this time whether fatigue and shortness of breath has improved as she just came back from upper endoscopy. No Further bleeding.  Objective: Vitals:   10/18/17 0900 10/18/17 0910 10/18/17 0945 10/18/17 1351  BP: (!) 142/46 (!) 160/56 (!) 144/66 (!) 141/67  Pulse: 65 65 61 65  Resp: 17 (!) '21 20 19  '$ Temp:   98 F (36.7 C) 98.4 F (36.9 C)  TempSrc:   Oral Oral  SpO2: 100% 99% 93% 100%  Weight:      Height:        Intake/Output Summary (Last 24 hours) at 10/18/2017 2026 Last data filed at 10/18/2017 1500 Gross per 24 hour  Intake 683.33 ml  Output -  Net 683.33 ml   Filed Weights   10/18/17 0715  Weight: 91.2 kg (201 lb)    Examination:  General exam: Appears calm and comfortable  Respiratory system: Clear to auscultation. Respiratory effort normal. Cardiovascular system: S1 & S2 heard, RRR. No JVD, murmurs, rubs, gallops or clicks. No pedal edema. Gastrointestinal system: Abdomen is nondistended, soft and tender to palpation on the right side. Normal bowel sounds heard. Central nervous system: Alert and oriented. No focal neurological deficits. Extremities: Symmetric 5 x 5 power. Skin: No rashes, lesions or ulcers Psychiatry: Judgement and insight appear normal. Mood & affect appropriate.     Data Reviewed: I have personally reviewed following labs and imaging studies  CBC: Recent Labs  Lab 10/17/17 1133 10/18/17 0636  WBC 11.1* 12.5*  NEUTROABS 8.6*  --   HGB 7.4* 9.9*  HCT 24.2* 30.7*  MCV 94.2 90.8  PLT 398 594*   Basic Metabolic Panel: Recent Labs  Lab 10/17/17 1133 10/18/17 0636  NA 129* 135  K 4.2 4.6  CL 102 110  CO2 16* 18*  GLUCOSE 229* 170*  BUN 84* 63*    CREATININE 1.93* 1.42*  CALCIUM 9.3 9.5   GFR: Estimated Creatinine Clearance: 46.2 mL/min (A) (by C-G formula based on SCr of 1.42 mg/dL (H)). Liver Function Tests: Recent Labs  Lab 10/17/17 1133 10/18/17 0636  AST 171* 208*  ALT 159* 170*  ALKPHOS 1,004* 1,095*  BILITOT 5.8* 6.2*  PROT 7.6 7.4  ALBUMIN 2.4* 2.3*   No results for input(s): LIPASE, AMYLASE in the last 168 hours. No results for input(s): AMMONIA in the last 168 hours. Coagulation Profile: Recent Labs  Lab 10/17/17 1334  INR 1.42   Cardiac Enzymes: No results for input(s): CKTOTAL, CKMB, CKMBINDEX, TROPONINI in the last 168 hours. BNP (last 3 results) No results for input(s): PROBNP in the last 8760 hours. HbA1C: No results for input(s): HGBA1C in the last 72 hours. CBG: Recent Labs  Lab 10/17/17 1824 10/18/17 0721 10/18/17 0926 10/18/17 1200 10/18/17 1723  GLUCAP 365* 163* 175* 197* 289*   Lipid Profile: No results for input(s): CHOL, HDL, LDLCALC, TRIG, CHOLHDL, LDLDIRECT in the last 72 hours. Thyroid Function Tests: No results for input(s): TSH, T4TOTAL, FREET4, T3FREE, THYROIDAB in the last 72 hours. Anemia Panel: Recent Labs    10/17/17 1319 10/17/17 1320  VITAMINB12  --  7,151*  FOLATE 21.9  --   FERRITIN  --  562*  TIBC  --  272  IRON  --  51  RETICCTPCT 6.1*  --    Sepsis Labs: No results for input(s): PROCALCITON, LATICACIDVEN in the last 168 hours.  No results found for this or any previous visit (from the past 240 hour(s)).       Radiology Studies: Dg Chest 2 View  Result Date: 10/17/2017 CLINICAL DATA:  Mid chest pain and shortness of breath, history of liver metastatic disease EXAM: CHEST  2 VIEW COMPARISON:  None. FINDINGS: The heart size and mediastinal contours are within normal limits. Both lungs are clear. The visualized skeletal structures are unremarkable. IMPRESSION: No active cardiopulmonary disease. Electronically Signed   By: Inez Catalina M.D.   On:  10/17/2017 11:48        Scheduled Meds: . carvedilol  3.125 mg Oral BID WC  . insulin aspart  0-9 Units Subcutaneous TID WC  . latanoprost  1 drop Both Eyes Daily  . ondansetron (ZOFRAN) IV  4 mg Intravenous Once  . [START ON 10/21/2017] pantoprazole  40 mg Intravenous Q12H  . sodium bicarbonate  650 mg Oral TID   Continuous Infusions: . sodium chloride 75 mL/hr at 10/18/17 1245  . pantoprozole (PROTONIX) infusion 8 mg/hr (10/18/17 1130)     LOS: 1 day    Time spent: 35 minutes    Irine Seal, MD [facility] Pager [phone]  If 7PM-7AM, please contact night-coverage [email or web address] Password [facility] 10/18/2017, 8:26 PM

## 2017-10-18 NOTE — Op Note (Signed)
EGD was performed for melena and anemia.  Superficial linear ulceration was noted in the distal esophagus. Biopsies taken and sent to pathology.  Mild erythema noted in antrum, biopsies taken to rule out H. Pylori. Retroflexion revealed normal cardia and fundus.   A large, 3 cm, partially obstructive, deep cratered ulcer was noted in the duodenal bulb, with small clot, pigmentation otherwise clean base. Biopsies taken to rule out malignancy(as patient has gallbladder cancer with liver metastases). This also was treated with injection of 4 mL of epinephrine(1 in 10:,000)   and bipolar cautery.  The rest of the duodenum appeared unremarkable.  Recommendations: Clear Liquid diet, monitor H&H and transfuse if hemoglobin is less than 7. Due to the size of the ulcer there is an increased chance of re-bleeding, recommend IV PPI drip for 72 hours before switching to twice a day.  Ronnette Juniper, M.D..

## 2017-10-18 NOTE — Brief Op Note (Signed)
10/17/2017 - 10/18/2017  8:44 AM  PATIENT:  Langston Reusing  62 y.o. female  PRE-OPERATIVE DIAGNOSIS:  Anemia, melena  POST-OPERATIVE DIAGNOSIS:  duodenal ulcer, esophageal ulcer  PROCEDURE:  Procedure(s): ESOPHAGOGASTRODUODENOSCOPY (EGD) WITH PROPOFOL (Left)  SURGEON:  Surgeon(s) and Role:    Ronnette Juniper, MD - Primary  PHYSICIAN ASSISTANT: None  ASSISTANTS: Burtis Junes, RN, Aneta Mins, Tech  ANESTHESIA:   MAC  EBL:  none   BLOOD ADMINISTERED:none  DRAINS: none   LOCAL MEDICATIONS USED:  NONE  SPECIMEN:  Biopsy / Limited Resection  DISPOSITION OF SPECIMEN:  PATHOLOGY  COUNTS:  YES  TOURNIQUET:  * No tourniquets in log *  DICTATION: .Dragon Dictation  PLAN OF CARE: Admit to inpatient   PATIENT DISPOSITION:  PACU - hemodynamically stable.   Delay start of Pharmacological VTE agent (>24hrs) due to surgical blood loss or risk of bleeding: yes

## 2017-10-18 NOTE — Progress Notes (Signed)
Pt vomiting blood tinged saliva c/o nausea no pain.  MD in to assess patient.  zofran 4mg  given per MD verbal order.

## 2017-10-18 NOTE — Anesthesia Preprocedure Evaluation (Signed)
Anesthesia Evaluation  Patient identified by MRN, date of birth, ID band Patient awake    Reviewed: Allergy & Precautions, NPO status , Patient's Chart, lab work & pertinent test results  Airway Mallampati: I  TM Distance: >3 FB Neck ROM: Full    Dental   Pulmonary former smoker,    Pulmonary exam normal        Cardiovascular hypertension, Pt. on medications + CAD  Normal cardiovascular exam     Neuro/Psych    GI/Hepatic   Endo/Other  diabetes, Type 2, Insulin Dependent  Renal/GU      Musculoskeletal   Abdominal   Peds  Hematology   Anesthesia Other Findings   Reproductive/Obstetrics                             Anesthesia Physical Anesthesia Plan  ASA: III  Anesthesia Plan: MAC   Post-op Pain Management:    Induction: Intravenous  PONV Risk Score and Plan: 2 and Treatment may vary due to age or medical condition  Airway Management Planned: Simple Face Mask  Additional Equipment:   Intra-op Plan:   Post-operative Plan:   Informed Consent: I have reviewed the patients History and Physical, chart, labs and discussed the procedure including the risks, benefits and alternatives for the proposed anesthesia with the patient or authorized representative who has indicated his/her understanding and acceptance.     Plan Discussed with: CRNA and Surgeon  Anesthesia Plan Comments:         Anesthesia Quick Evaluation

## 2017-10-18 NOTE — Op Note (Signed)
[facility] Patient Name: Julie Barker Procedure Date: 10/18/2017 MRN: [id number] Attending MD: Ronnette Juniper , MD Date of Birth: September 13, 1955 CSN: [id number] Age: 61 Admit Type: Inpatient Procedure:                Upper GI endoscopy Indications:              Acute post hemorrhagic anemia, Melena, Gb cancer                            wtih liver metastasis Providers:                Ronnette Juniper, MD, Burtis Junes, RN, Alan Mulder,                            Technician, Ed[address] Circle. [name] CRNA, CRNA Referring MD:              Medicines:                Monitored Anesthesia Care Complications:            No immediate complications. Estimated Blood Loss:     Estimated blood loss: none. Procedure:                Pre-Anesthesia Assessment:                           - Prior to the procedure, a History and Physical                            was performed, and patient medications and                            allergies were reviewed. The patient's tolerance of                            previous anesthesia was also reviewed. The risks                            and benefits of the procedure and the sedation                            options and risks were discussed with the patient.                            All questions were answered, and informed consent                            was obtained. Prior Anticoagulants: The patient has                            taken aspirin, last dose was 2 days prior to                            procedure. ASA Grade Assessment: III - A patient  with severe systemic disease. After reviewing the                            risks and benefits, the patient was deemed in                            satisfactory condition to undergo the procedure.                           After obtaining informed consent, the endoscope was                            passed under direct vision. Throughout the   procedure, the patient's blood pressure, pulse, and                            oxygen saturations were monitored continuously. The                            [id number]) scope was introduced through the                            mouth, and advanced to the second part of duodenum.                            The upper GI endoscopy was accomplished without                            difficulty. The patient tolerated the procedure                            well. Scope In: Scope Out: Findings:      One linear and superficial esophageal ulcer with no bleeding and no       stigmata of recent bleeding was found 34 to 36 cm from the incisors. The       lesion was 8 mm in largest dimension. Biopsies were taken with a cold       forceps for histology.      Localized mildly erythematous mucosa without bleeding was found in the       gastric antrum. Biopsies were taken with a cold forceps for Helicobacter       pylori testing.      The cardia and gastric fundus were normal on retroflexion.      One large, partially obstructing, non-bleeding cratered, deep duodenal       ulcer with pigmented material and small amount of clot,otherwise a clean       base was found in the duodenal bulb. The lesion was 30 mm in largest       dimension. Biopsies were taken with a cold forceps for histology to rule       out malignancy. Area was successfully injected with 4 mL of a 1:10,000       solution of epinephrine for bleeding prevention and bipolar probe was       used at the site of clot and surrounding erythematous appearing area.      The second portion of the  duodenum was normal. Impression:               - Small linear superficial non-bleeding esophageal                            ulcer. Biopsied.                           - Erythematous mucosa in the antrum. Biopsied.                           - One large, partially obstructing,non-bleeding,                            cratered, deep duodenal ulcer  with pigmented                            material,small clot and otherwise clean base.                            Biopsied. Injected. Treated with bipolar cautery.                           - Normal second portion of the duodenum. Moderate Sedation:      Patient did not receive moderate sedation for this procedure, but       instead received monitored anesthesia care. Recommendation:           - Clear liquid diet today.                           - Continue present medications.                           - Continue Protonix drip for total of 72 hours                            before switching to BID, avoid NSAIDs and ASA.                           - Monitor H and H and transfuse if Hb is less than                            7gm/dl.                           - There is increased risk of rebleeding despite                            therapy with epinephrine and cautery as the ulcer                            is large,deep and cratered.                           - Return patient to hospital ward for ongoing care. Procedure Code(s):        ---  Professional ---                           [phone], 26, Esophagogastroduodenoscopy, flexible,                            transoral; with control of bleeding, any method                           43239, Esophagogastroduodenoscopy, flexible,                            transoral; with biopsy, single or multiple Diagnosis Code(s):        --- Professional ---                           K22.10, Ulcer of esophagus without bleeding                           K31.89, Other diseases of stomach and duodenum                           K26.9, Duodenal ulcer, unspecified as acute or                            chronic, without hemorrhage or perforation                           D62, Acute posthemorrhagic anemia                           K92.1, Melena (includes Hematochezia) CPT copyright 2016 American Medical Association. All rights reserved. The codes documented in this  report are preliminary and upon coder review may  be revised to meet current compliance requirements. Ronnette Juniper, MD 10/18/2017 8:44:16 AM This report has been signed electronically. Number of Addenda: 0

## 2017-10-18 NOTE — Interval H&P Note (Signed)
History and Physical Interval Note:  61/female with recently diagnosed GB cancer with liver metastases presents with anemia and black tarry stools. 10/18/2017 7:53 AM  Julie Barker  has presented today for EGD, with the diagnosis of Anemia, melena  The various methods of treatment have been discussed with the patient and family. After consideration of risks, benefits and other options for treatment, the patient has consented to  Procedure(s): ESOPHAGOGASTRODUODENOSCOPY (EGD) WITH PROPOFOL (Left) as a surgical intervention .  The patient's history has been reviewed, patient examined, no change in status, stable for surgery.  I have reviewed the patient's chart and labs.  Questions were answered to the patient's satisfaction.     Ronnette Juniper

## 2017-10-18 NOTE — Anesthesia Postprocedure Evaluation (Signed)
Anesthesia Post Note  Patient: Julie Barker  Procedure(s) Performed: ESOPHAGOGASTRODUODENOSCOPY (EGD) WITH PROPOFOL (Left )     Patient location during evaluation: PACU Anesthesia Type: MAC Level of consciousness: awake and alert Pain management: pain level controlled Vital Signs Assessment: post-procedure vital signs reviewed and stable Respiratory status: spontaneous breathing, nonlabored ventilation, respiratory function stable and patient connected to nasal cannula oxygen Cardiovascular status: stable and blood pressure returned to baseline Postop Assessment: no apparent nausea or vomiting Anesthetic complications: no    Last Vitals:  Vitals:   10/18/17 0945 10/18/17 1351  BP: (!) 144/66 (!) 141/67  Pulse: 61 65  Resp: 20 19  Temp: 36.7 C 36.9 C  SpO2: 93% 100%    Last Pain:  Vitals:   10/18/17 1351  TempSrc: Oral  PainSc: 0-No pain                 Felcia Huebert DAVID

## 2017-10-18 NOTE — Progress Notes (Signed)
Pt gave permission to ask questions on nursing admission history in front of her daughter and her husband. Lucius Conn BSN, RN-BC Admissions RN 10/18/2017 4:20 PM

## 2017-10-19 ENCOUNTER — Encounter (HOSPITAL_COMMUNITY): Payer: Self-pay | Admitting: Gastroenterology

## 2017-10-19 ENCOUNTER — Other Ambulatory Visit: Payer: Self-pay

## 2017-10-19 DIAGNOSIS — R634 Abnormal weight loss: Secondary | ICD-10-CM

## 2017-10-19 DIAGNOSIS — C787 Secondary malignant neoplasm of liver and intrahepatic bile duct: Secondary | ICD-10-CM

## 2017-10-19 DIAGNOSIS — D259 Leiomyoma of uterus, unspecified: Secondary | ICD-10-CM

## 2017-10-19 DIAGNOSIS — D500 Iron deficiency anemia secondary to blood loss (chronic): Secondary | ICD-10-CM

## 2017-10-19 DIAGNOSIS — R630 Anorexia: Secondary | ICD-10-CM

## 2017-10-19 LAB — COMPREHENSIVE METABOLIC PANEL
ALK PHOS: 1268 U/L — AB (ref 38–126)
ALT: 201 U/L — AB (ref 14–54)
ANION GAP: 7 (ref 5–15)
AST: 278 U/L — ABNORMAL HIGH (ref 15–41)
Albumin: 2.2 g/dL — ABNORMAL LOW (ref 3.5–5.0)
BILIRUBIN TOTAL: 9.4 mg/dL — AB (ref 0.3–1.2)
BUN: 33 mg/dL — ABNORMAL HIGH (ref 6–20)
CALCIUM: 9.3 mg/dL (ref 8.9–10.3)
CO2: 20 mmol/L — AB (ref 22–32)
CREATININE: 1.16 mg/dL — AB (ref 0.44–1.00)
Chloride: 107 mmol/L (ref 101–111)
GFR, EST AFRICAN AMERICAN: 57 mL/min — AB (ref >60)
GFR, EST NON AFRICAN AMERICAN: 49 mL/min — AB (ref >60)
Glucose, Bld: 203 mg/dL — ABNORMAL HIGH (ref 65–99)
Potassium: 4.5 mmol/L (ref 3.5–5.1)
SODIUM: 134 mmol/L — AB (ref 135–145)
TOTAL PROTEIN: 7.1 g/dL (ref 6.5–8.1)

## 2017-10-19 LAB — GLUCOSE, CAPILLARY
GLUCOSE-CAPILLARY: 188 mg/dL — AB (ref 65–99)
GLUCOSE-CAPILLARY: 298 mg/dL — AB (ref 65–99)
GLUCOSE-CAPILLARY: 277 mg/dL — AB (ref 65–99)
GLUCOSE-CAPILLARY: 278 mg/dL — AB (ref 65–99)

## 2017-10-19 LAB — CBC
HCT: 31.3 % — ABNORMAL LOW (ref 36.0–46.0)
HEMOGLOBIN: 9.8 g/dL — AB (ref 12.0–15.0)
MCH: 28.7 pg (ref 26.0–34.0)
MCHC: 31.3 g/dL (ref 30.0–36.0)
MCV: 91.5 fL (ref 78.0–100.0)
Platelets: 397 10*3/uL (ref 150–400)
RBC: 3.42 MIL/uL — ABNORMAL LOW (ref 3.87–5.11)
RDW: 18.7 % — ABNORMAL HIGH (ref 11.5–15.5)
WBC: 11.4 10*3/uL — ABNORMAL HIGH (ref 4.0–10.5)

## 2017-10-19 LAB — CANCER ANTIGEN 19-9: CA 19-9: 8 U/mL (ref 0–35)

## 2017-10-19 LAB — AFP TUMOR MARKER: AFP, SERUM, TUMOR MARKER: 1.5 ng/mL (ref 0.0–8.3)

## 2017-10-19 LAB — MAGNESIUM: MAGNESIUM: 1.6 mg/dL — AB (ref 1.7–2.4)

## 2017-10-19 LAB — CEA: CEA1: 8.4 ng/mL — AB (ref 0.0–4.7)

## 2017-10-19 MED ORDER — AMLODIPINE BESYLATE 10 MG PO TABS
10.0000 mg | ORAL_TABLET | Freq: Every day | ORAL | Status: DC
  Administered 2017-10-19 – 2017-10-20 (×2): 10 mg via ORAL
  Filled 2017-10-19 (×2): qty 1

## 2017-10-19 MED ORDER — UNJURY CHICKEN SOUP POWDER
8.0000 [oz_av] | Freq: Two times a day (BID) | ORAL | Status: DC
  Administered 2017-10-19: 8 [oz_av] via ORAL
  Filled 2017-10-19 (×4): qty 27

## 2017-10-19 MED ORDER — POLYETHYLENE GLYCOL 3350 17 G PO PACK
17.0000 g | PACK | Freq: Every day | ORAL | Status: DC
  Administered 2017-10-19 – 2017-10-20 (×2): 17 g via ORAL
  Filled 2017-10-19 (×2): qty 1

## 2017-10-19 MED ORDER — URSODIOL 300 MG PO CAPS
300.0000 mg | ORAL_CAPSULE | Freq: Three times a day (TID) | ORAL | Status: DC
  Administered 2017-10-19 – 2017-10-20 (×5): 300 mg via ORAL
  Filled 2017-10-19 (×6): qty 1

## 2017-10-19 MED ORDER — CARVEDILOL 12.5 MG PO TABS
12.5000 mg | ORAL_TABLET | Freq: Two times a day (BID) | ORAL | Status: DC
  Administered 2017-10-19 – 2017-10-20 (×2): 12.5 mg via ORAL
  Filled 2017-10-19 (×3): qty 1

## 2017-10-19 MED ORDER — MAGNESIUM SULFATE 4 GM/100ML IV SOLN
4.0000 g | Freq: Once | INTRAVENOUS | Status: AC
  Administered 2017-10-19: 4 g via INTRAVENOUS
  Filled 2017-10-19: qty 100

## 2017-10-19 MED ORDER — INSULIN GLARGINE 100 UNIT/ML ~~LOC~~ SOLN
20.0000 [IU] | Freq: Every day | SUBCUTANEOUS | Status: DC
  Administered 2017-10-19: 20 [IU] via SUBCUTANEOUS
  Filled 2017-10-19 (×2): qty 0.2

## 2017-10-19 NOTE — Consult Note (Addendum)
New Hematology/Oncology Consult   Referral MD: Irine Seal     Reason for Referral: Metastatic adenocarcinoma  HPI: Ms. Christoffel reports the onset of postprandial abdominal pain in December.  She saw Dr. Lysle Rubens.  An ultrasound of the abdomen 08/15/2017 revealed multiple hepatic lesions concerning for metastatic disease.  A CT 08/17/2017 found the gallbladder to not discretely be identified.  The gallbladder fossa was filled with soft tissue and calcification.  Abnormal low attenuation in the liver along the gallbladder fossa was concerning for a gallbladder mass with direct invasion of the liver.  Additional liver lesions were noted.  Lymphadenopathy was noted at the porta hepatis and hepatoduodenal ligament. 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 An MRI of the abdomen 10/14/2017 revealed evidence of a gallbladder mass with invasion of the liver.  Multiple hepatic metastases were noted in addition to upper abdominal nodal metastases.  Ms. Olean Ree reports developing progressive "weakness "over the past few weeks.  She presented to the emergency room 10/17/2017.  She noted dark stool.  The hemoglobin returned at 7.4.  She was admitted for further evaluation.  Dr. Therisa Doyne was consulted and she was taken to an upper endoscopy 10/18/2017.  A large partially obstructing deep duodenal ulcer was found in the duodenal bulb with pigmented material and a small clot.  The ulcer was biopsied and treated with bipolar cautery.  Ms. Shumard was transfused 2 units of packed red blood cells upon hospital admission.    Past Medical History:  Diagnosis Date  . Coronary artery disease    has diffuse distal vessel disease. Managed medically  . Diabetes mellitus   . Duodenal ulcer: Per EGD 10/18/2017 10/18/2017  . Esophageal ulceration: EGD 10/18/2017 10/18/2017  . Fibroid tumor   . Hyperlipidemia   . Hypertension   .   Adenocarcinoma-segment 5 liver mass  09/21/2017  . Type 2 diabetes mellitus without complication ([place]) 04/15/276  :  .   G2, P1-1 miscarriage  Past Surgical History:  Procedure Laterality Date  . CARDIAC CATHETERIZATION  04/05/2007   EF 75%.MODERATE 3 VESSEL CAD  . CARDIOVASCULAR STRESS TEST  02/28/2007   EF 53%. NO ISCHEMIA  . COLONOSCOPY WITH PROPOFOL N/A 05/31/2015   Procedure: COLONOSCOPY WITH PROPOFOL;  Surgeon: Garlan Fair, MD;  Location: [facility];  Service: Endoscopy;  Laterality: N/A;  . ESOPHAGOGASTRODUODENOSCOPY (EGD) WITH PROPOFOL Left 10/18/2017   Procedure: ESOPHAGOGASTRODUODENOSCOPY (EGD) WITH PROPOFOL;  Surgeon: Ronnette Juniper, MD;  Location: [facility];  Service: Gastroenterology;  Laterality: Left;  :   Current Facility-Administered Medications:  .  0.9 %  sodium chloride infusion, , Intravenous, Continuous, Florencia Reasons, MD, Last Rate: 75 mL/hr at 10/18/17 1245 .  carvedilol (COREG) tablet 3.125 mg, 3.125 mg, Oral, BID WC, Florencia Reasons, MD, 3.125 mg at 10/18/17 1745 .  insulin aspart (novoLOG) injection 0-9 Units, 0-9 Units, Subcutaneous, TID WC, Florencia Reasons, MD, 5 Units at 10/18/17 1745 .  latanoprost (XALATAN) 0.005 % ophthalmic solution 1 drop, 1 drop, Both Eyes, Daily, Florencia Reasons, MD .  ondansetron [facility]) injection 4 mg, 4 mg, Intravenous, Once, Ronnette Juniper, MD .  pantoprazole (PROTONIX) 80 mg in sodium chloride 0.9 % 250 mL (0.32 mg/mL) infusion, 8 mg/hr, Intravenous, Continuous, Ronnette Juniper, MD, Last Rate: 25 mL/hr at 10/19/17 0030, 8 mg/hr at 10/19/17 0030 .  [START ON 10/21/2017] pantoprazole (PROTONIX) injection 40 mg, 40 mg, Intravenous, Q12H, Ronnette Juniper, MD .  sodium bicarbonate tablet 650 mg,  650 mg, Oral, TID, Florencia Reasons, MD, 650 mg at 10/18/17 2237:  . carvedilol  3.125 mg Oral BID WC  . insulin aspart  0-9 Units Subcutaneous TID WC  . latanoprost  1 drop Both Eyes Daily  . ondansetron (ZOFRAN) IV  4 mg Intravenous Once  . [START ON 10/21/2017] pantoprazole  40 mg  Intravenous Q12H  . sodium bicarbonate  650 mg Oral TID  :  No Known Allergies:  FH: Her brother had head and neck cancer.  No other family history of cancer.  SOCIAL HISTORY: She lives with her husband in [place].  She is retired from a retail job.  She does not smoke cigarettes or use alcohol.  No risk factor for HIV or hepatitis.  No previous transfusion history.  Review of Systems:  Positives include: Postprandial abdominal pain, anorexia/weight loss, weakness prior to hospital admission, pruritus  A complete ROS was otherwise negative.   Physical Exam:  Blood pressure (!) 170/70, pulse 65, temperature 98.5 F (36.9 C), temperature source Oral, resp. rate 20, height 5\' 6"  (1.676 m), weight 201 lb (91.2 kg), SpO2 100 %.  HEENT: No upper teeth, oral cavity without visible mass, neck without mass Lungs: Clear bilaterally Cardiac: Regular rate and rhythm Abdomen: No hepatosplenomegaly, no mass, nontender  Vascular: No leg edema Lymph nodes: No cervical, supraclavicular, axillary, or inguinal nodes Neurologic: Alert and oriented, the motor exam appears intact in the upper and lower extremities Skin: No rash Musculoskeletal: No spine tenderness  LABS:  Recent Labs    10/18/17 0636 10/19/17 0644  WBC 12.5* 11.4*  HGB 9.9* 9.8*  HCT 30.7* 31.3*  PLT 411* 397    Recent Labs    10/17/17 1133 10/18/17 0636  NA 129* 135  K 4.2 4.6  CL 102 110  CO2 16* 18*  GLUCOSE 229* 170*  BUN 84* 63*  CREATININE 1.93* 1.42*  CALCIUM 9.3 9.5      RADIOLOGY:  Dg Chest 2 View  Result Date: 10/17/2017 CLINICAL DATA:  Mid chest pain and shortness of breath, history of liver metastatic disease EXAM: CHEST  2 VIEW COMPARISON:  None. FINDINGS: The heart size and mediastinal contours are within normal limits. Both lungs are clear. The visualized skeletal structures are unremarkable. IMPRESSION: No active cardiopulmonary disease. Electronically Signed   By: Inez Catalina M.D.   On:  10/17/2017 11:48   Mr Abdomen Wwo Contrast  Result Date: 10/14/2017 CLINICAL DATA:  Liver mass, with biopsy-proven adenocarcinoma EXAM: MRI ABDOMEN WITHOUT AND WITH CONTRAST TECHNIQUE: Multiplanar multisequence MR imaging of the abdomen was performed both before and after the administration of intravenous contrast. CONTRAST:  2mL MULTIHANCE GADOBENATE DIMEGLUMINE 529 MG/ML IV SOLN COMPARISON:  CT abdomen/pelvis dated 08/17/2017 FINDINGS: Lower chest: Lung bases are clear. Hepatobiliary: Abnormal, mass-like appearance of the gallbladder (series 8/image 26), which directly invades into the adjacent hepatic parenchyma along the gallbladder fossa, suggesting gallbladder adenocarcinoma. Tumor is inseparable between the gallbladder and liver. Adjacent multifocal hepatic metastases which have progressed from prior CT, including: --2.1 cm lesion in segment 4B (series 15/image 42), previously 1.3 cm --1.6 cm lesion in segment 4B adjacent to the falciform ligament (series 15/image 53), possibly new --1.8 cm subcapsular lesion in segment 5 (series 15/image 60), previously 1.6 cm --2.1 cm lesion posteriorly in segment 5 (series 15/image 62), difficult to discretely visualized on CT Pancreas:  Within normal limits. Spleen:  Within normal limits. Adrenals/Urinary Tract:  Adrenal glands are within normal limits. Severe right renal scarring/atrophy. Multiple bilateral  renal cysts. No hydronephrosis. Stomach/Bowel: Stomach is within normal limits. Visualized bowel is grossly unremarkable. Vascular/Lymphatic:  No evidence of abdominal aortic aneurysm. Large nodal metastases in the upper abdomen, progressed from recent CT, including: --3.1 cm short axis node in the porta pedis (series 8/image 16) --2.5 cm short axis peripancreatic node (series 8/image 18), previously 1.4 cm --2.7 cm short axis portacaval node (series 8/image 20), previously 2.1 cm Other:  No abdominal ascites. Musculoskeletal: No focal osseous lesions. IMPRESSION:  Suspected gallbladder adenocarcinoma with direct invasion of the adjacent liver. Multifocal hepatic metastases in both lobes, progressed from recent CT. Progression of upper abdominal nodal metastases. Electronically Signed   By: Julian Hy M.D.   On: 10/14/2017 11:06   US Biopsy (liver)  Result Date: 09/21/2017 INDICATION: 62 year old female with a liver mass adjacent to the gallbladder fossa concerning for gallbladder carcinoma. EXAM: Ultrasound-guided core biopsy of liver lesion MEDICATIONS: None. ANESTHESIA/SEDATION: Moderate (conscious) sedation was employed during this procedure. A total of Versed 1 mg and Fentanyl 50 mcg was administered intravenously. Moderate Sedation Time: 11 minutes. The patient's level of consciousness and vital signs were monitored continuously by radiology nursing throughout the procedure under my direct supervision. FLUOROSCOPY TIME:  Fluoroscopy Time: 0 minutes 0 seconds (0 mGy). COMPLICATIONS: None immediate. PROCEDURE: Informed written consent was obtained from the patient after a thorough discussion of the procedural risks, benefits and alternatives. All questions were addressed. Maximal Sterile Barrier Technique was utilized including caps, mask, sterile gowns, sterile gloves, sterile drape, hand hygiene and skin antiseptic. A timeout was performed prior to the initiation of the procedure. The right upper quadrant was interrogated with ultrasound. The mass in hepatic segment 5 was easily identified. A suitable skin entry site was selected and marked. Following sterile prep and draped using chlorhexidine, local anesthesia was attained by 1% lidocaine infiltration. A small dermatotomy was made. Under real-time sonographic guidance, a 17 gauge introducer needle was advanced through the liver and positioned at the margin of the mass. Multiple 18 gauge core biopsies were then coaxially obtained using the bio Pince automated biopsy device. Biopsy specimens were placed in  formalin and delivered to pathology for further analysis. As the introducer needle was removed, the biopsy tract was embolized with a Gel-Foam slurry. Post biopsy ultrasound imaging demonstrates no evidence of hemorrhage or other complication. The patient tolerated the procedure well. IMPRESSION: Technically successful ultrasound-guided core biopsy of hepatic mass. Electronically Signed   By: Jacqulynn Cadet M.D.   On: 09/21/2017 14:38    Assessment and Plan:   1.  Metastatic adenocarcinoma, biopsy of a segment 5 liver mass 09/21/2017-adenocarcinoma  CT abdomen/pelvis 08/17/2017-gallbladder fossa mass, upper abdominal lymphadenopathy, liver metastases  MRI of the liver 10/14/2017-masslike appearance of the gallbladder with direct invasion of the adjacent liver, multiple hepatic metastases, upper abdominal nodal metastases  2.  Severe anemia secondary to GI bleeding  Duodenal bulb ulcer noted on endoscopy 10/18/2017, biopsied-pathology pending  3.  Anorexia/weight loss  4.  Diabetes  5.  Uterine fibroids  Ms. Amendola is admitted with symptomatic anemia.  The anemia appears to be secondary to bleeding from a duodenal ulcer, malignant?.  She has been diagnosed with adenocarcinoma involving a liver mass.  The clinical presentation is most consistent with adenocarcinoma of the gallbladder with metastatic disease to the liver and abdominal lymph nodes.  I discussed the probable diagnosis with [place] and her family.  They understand no therapy will be curative.  Treatment will consist of systemic chemotherapy.  She will  benefit from a Port-A-Cath for administration of chemotherapy.  I will request additional immunohistochemistry stains and molecular testing on the liver biopsy material.  She is symptomatic with pruritus, likely secondary to.  She may benefit from a repeat study to look for evidence of bile duct dilatation as a bile duct drain or stent could relieve the pruritus  Ms. Roa will  return for an office visit 10/22/2017.  Recommendations: 1.  Ulcer/GI bleeding per gastroenterology 2.  Outpatient follow-up at the cancer center 10/22/2017 3.  Repeat bilirubin, consider reimaging of the liver to look for evidence of biliary obstruction 4.  Call oncology as needed  Betsy Coder, MD 10/19/2017, 7:27 AM

## 2017-10-19 NOTE — Progress Notes (Signed)
Subjective: The patient was seen and examined at bedside. Did not have a bowel movement post-endoscopy. Complains of itching/pruritus. Denies nausea, vomiting or abdominal pain.  Objective: Vital signs in last 24 hours: Temp:  [98.4 F (36.9 C)-98.9 F (37.2 C)] 98.5 F (36.9 C) (02/08 0553) Pulse Rate:  [65] 65 (02/08 0553) Resp:  [19-20] 20 (02/08 0553) BP: (141-170)/(66-70) 170/70 (02/08 0553) SpO2:  [98 %-100 %] 100 % (02/08 0553) Weight change:  Last BM Date: 10/16/17  PE: Obvious pallor, obvious icterus GENERAL: Not in acute distress ABDOMEN: Soft, nondistended, nontender, normoactive bowel sounds EXTREMITIES: No edema, no deformity  Lab Results: Results for orders placed or performed during the hospital encounter of 10/17/17 (from the past 48 hour(s))  Basic metabolic panel     Status: Abnormal   Collection Time: 10/17/17 11:33 AM  Result Value Ref Range   Sodium 129 (L) 135 - 145 mmol/L   Potassium 4.2 3.5 - 5.1 mmol/L   Chloride 102 101 - 111 mmol/L   CO2 16 (L) 22 - 32 mmol/L   Glucose, Bld 229 (H) 65 - 99 mg/dL   BUN 84 (H) 6 - 20 mg/dL   Creatinine, Ser 1.93 (H) 0.44 - 1.00 mg/dL   Calcium 9.3 8.9 - 10.3 mg/dL   GFR calc non Af Amer 26 (L) >60 mL/min   GFR calc Af Amer 31 (L) >60 mL/min    Comment: (NOTE) The eGFR has been calculated using the CKD EPI equation. This calculation has not been validated in all clinical situations. eGFR's persistently <60 mL/min signify possible Chronic Kidney Disease.    Anion gap 11 5 - 15    Comment: Performed at [facility], [place] [address], [place], [place] [address]  CBC     Status: Abnormal   Collection Time: 10/17/17 11:33 AM  Result Value Ref Range   WBC 11.1 (H) 4.0 - 10.5 K/uL   RBC 2.57 (L) 3.87 - 5.11 MIL/uL   Hemoglobin 7.4 (L) 12.0 - 15.0 g/dL   HCT 24.2 (L) 36.0 - 46.0 %   MCV 94.2 78.0 - 100.0 fL   MCH 28.8 26.0 - 34.0 pg   MCHC 30.6 30.0 - 36.0 g/dL   RDW 19.0 (H) 11.5 - 15.5 %    Platelets 398 150 - 400 K/uL    Comment: Performed at [facility], [place] [address], [place], [place] [address]  Hepatic function panel     Status: Abnormal   Collection Time: 10/17/17 11:33 AM  Result Value Ref Range   Total Protein 7.6 6.5 - 8.1 g/dL   Albumin 2.4 (L) 3.5 - 5.0 g/dL   AST 171 (H) 15 - 41 U/L   ALT 159 (H) 14 - 54 U/L   Alkaline Phosphatase 1,004 (H) 38 - 126 U/L   Total Bilirubin 5.8 (H) 0.3 - 1.2 mg/dL   Bilirubin, Direct 3.5 (H) 0.1 - 0.5 mg/dL   Indirect Bilirubin 2.3 (H) 0.3 - 0.9 mg/dL    Comment: Performed at [facility], [place] [address] [place], [place] [address]  Differential     Status: Abnormal   Collection Time: 10/17/17 11:33 AM  Result Value Ref Range   Neutrophils Relative % 78 %   Neutro Abs 8.6 (H) 1.7 - 7.7 K/uL   Lymphocytes Relative 9 %   Lymphs Abs 1.0 0.7 - 4.0 K/uL   Monocytes Relative 11 %   Monocytes Absolute 1.2 (H) 0.1 - 1.0 K/uL   Eosinophils  Relative 2 %   Eosinophils Absolute 0.3 0.0 - 0.7 K/uL   Basophils Relative 0 %   Basophils Absolute 0.0 0.0 - 0.1 K/uL    Comment: Performed at [facility], [place] [address] [place], [place] [address]  I-stat troponin, ED     Status: None   Collection Time: 10/17/17 11:58 AM  Result Value Ref Range   Troponin i, poc 0.00 0.00 - 0.08 ng/mL   Comment 3            Comment: Due to the release kinetics of cTnI, a negative result within the first hours of the onset of symptoms does not rule out myocardial infarction with certainty. If myocardial infarction is still suspected, repeat the test at appropriate intervals.   Folate     Status: None   Collection Time: 10/17/17  1:19 PM  Result Value Ref Range   Folate 21.9 >5.9 ng/mL    Comment: Performed at [place] [address], [place], [place] [address]  Reticulocytes     Status: Abnormal   Collection Time: 10/17/17  1:19 PM  Result Value Ref Range   Retic Ct Pct 6.1 (H) 0.4  - 3.1 %   RBC. 2.34 (L) 3.87 - 5.11 MIL/uL   Retic Count, Absolute 142.7 19.0 - 186.0 K/uL    Comment: Performed at [facility], [place] [address] [place], [place] [address]  Vitamin B12     Status: Abnormal   Collection Time: 10/17/17  1:20 PM  Result Value Ref Range   Vitamin B-12 7,151 (H) 180 - 914 pg/mL    Comment: (NOTE) This assay is not validated for testing neonatal or myeloproliferative syndrome specimens for Vitamin B12 levels. Performed at [facility], [place] [address] [place], [place] [address]   Iron and TIBC     Status: None   Collection Time: 10/17/17  1:20 PM  Result Value Ref Range   Iron 51 28 - 170 ug/dL   TIBC 272 250 - 450 ug/dL   Saturation Ratios 19 10.4 - 31.8 %   UIBC 221 ug/dL    Comment: Performed at [facility], [place] [address], [place], [place] [address]  Ferritin     Status: Abnormal   Collection Time: 10/17/17  1:20 PM  Result Value Ref Range   Ferritin 562 (H) 11 - 307 ng/mL    Comment: Performed at [facility], [place] [address], [place], [place] [address]  Type and screen [place]     Status: None   Collection Time: 10/17/17  1:21 PM  Result Value Ref Range   ABO/RH(D) AB POS    Antibody Screen NEG    Sample Expiration 10/20/2017    Unit Number [id number]    Blood Component Type RED CELLS,LR    Unit division 00    Status of Unit ISSUED,FINAL    Transfusion Status OK TO TRANSFUSE    Crossmatch Result      Compatible Performed at [facility], [place] [address], [place], [place] [address]    Unit Number [id number]    Blood Component Type RED CELLS,LR    Unit division 00    Status of Unit ISSUED,FINAL    Transfusion Status OK TO TRANSFUSE    Crossmatch Result Compatible   POC occult blood, ED     Status: Abnormal   Collection Time: 10/17/17  1:34 PM  Result Value Ref Range  Fecal Occult Bld POSITIVE (A) NEGATIVE  Protime-INR     Status: Abnormal    Collection Time: 10/17/17  1:34 PM  Result Value Ref Range   Prothrombin Time 17.2 (H) 11.4 - 15.2 seconds   INR 1.42     Comment: Performed at [facility], [place] [address], [place], [place] [address]  APTT     Status: None   Collection Time: 10/17/17  1:34 PM  Result Value Ref Range   aPTT 35 24 - 36 seconds    Comment: Performed at [facility], [place] [address] [place], [place] [address]  ABO/Rh     Status: None   Collection Time: 10/17/17  1:39 PM  Result Value Ref Range   ABO/RH(D)      AB POS Performed at [facility], [place] [address], [place], [place] [address]   Prepare RBC     Status: None   Collection Time: 10/17/17  2:00 PM  Result Value Ref Range   Order Confirmation      ORDER PROCESSED BY BLOOD BANK Performed at [place] [address], [place], [place] [address]   Haptoglobin     Status: Abnormal   Collection Time: 10/17/17  5:28 PM  Result Value Ref Range   Haptoglobin 225 (H) 34 - 200 mg/dL    Comment: (NOTE) Performed At: [facility] [place], [place] [id number] Rush Farmer MD [id number] Performed at [facility], [place] [address] [place], [place] [address]   Lactate dehydrogenase     Status: None   Collection Time: 10/17/17  5:28 PM  Result Value Ref Range   LDH 172 98 - 192 U/L    Comment: Performed at [facility], [place] [address], [place], [place] [address]  Glucose, capillary     Status: Abnormal   Collection Time: 10/17/17  6:24 PM  Result Value Ref Range   Glucose-Capillary 365 (H) 65 - 99 mg/dL  Save smear     Status: None   Collection Time: 10/18/17  6:36 AM  Result Value Ref Range   Smear Review SMEAR STAINED AND AVAILABLE FOR REVIEW     Comment: Performed at [facility], [place] [address], [place], [place] [address]  CEA     Status: Abnormal   Collection Time: 10/18/17  6:36 AM   Result Value Ref Range   CEA 8.4 (H) 0.0 - 4.7 ng/mL    Comment: (NOTE)                             Nonsmokers          <3.9                             Smokers             <5.6 [employer] Electrochemiluminescence Immunoassay (ECLIA) Values obtained with different assay methods or kits cannot be used interchangeably.  Results cannot be interpreted as absolute evidence of the presence or absence of malignant disease. Performed At: [facility] [place], [place] [id number] Rush Farmer MD [id number] Performed at [facility], [place] [address], [place],  [address]   AFP tumor marker     Status: None   Collection Time: 10/18/17  6:36 AM  Result Value Ref Range   AFP, Serum, Tumor  Marker 1.5 0.0 - 8.3 ng/mL    Comment: (NOTE) [employer] Electrochemiluminescence Immunoassay (ECLIA) Values obtained with different assay methods or kits cannot be used interchangeably.  Results cannot be interpreted as absolute evidence of the presence or absence of malignant disease. This test is not interpretable in pregnant females. Performed At: [facility] [place], [place] [id number] Rush Farmer MD [id number] Performed at [facility], [place] [address], [place], [place] [address]   Cancer antigen 19-9     Status: None   Collection Time: 10/18/17  6:36 AM  Result Value Ref Range   CA 19-9 8 0 - 35 U/mL    Comment: (NOTE) [employer] Electrochemiluminescence Immunoassay (ECLIA) Values obtained with different assay methods or kits cannot be used interchangeably.  Results cannot be interpreted as absolute evidence of the presence or absence of malignant disease. Performed At: [facility] [place], [place] [id number] Rush Farmer MD [id number] Performed at [facility], [place] [address] [place], [place] [address]   HIV antibody  (Routine Testing)     Status: None   Collection Time: 10/18/17  6:36 AM  Result Value Ref Range   HIV Screen 4th Generation wRfx Non Reactive Non Reactive    Comment: (NOTE) Performed At: [facility] [place], [place] [id number] Rush Farmer MD [id number] Performed at [facility], [place] [address], [place], [place] [address]   CBC     Status: Abnormal   Collection Time: 10/18/17  6:36 AM  Result Value Ref Range   WBC 12.5 (H) 4.0 - 10.5 K/uL   RBC 3.38 (L) 3.87 - 5.11 MIL/uL   Hemoglobin 9.9 (L) 12.0 - 15.0 g/dL    Comment: DELTA CHECK NOTED POST TRANSFUSION SPECIMEN    HCT 30.7 (L) 36.0 - 46.0 %   MCV 90.8 78.0 - 100.0 fL   MCH 29.3 26.0 - 34.0 pg   MCHC 32.2 30.0 - 36.0 g/dL   RDW 18.5 (H) 11.5 - 15.5 %   Platelets 411 (H) 150 - 400 K/uL    Comment: Performed at [facility], [place] [address], [place], [place] [address]  Comprehensive metabolic panel     Status: Abnormal   Collection Time: 10/18/17  6:36 AM  Result Value Ref Range   Sodium 135 135 - 145 mmol/L   Potassium 4.6 3.5 - 5.1 mmol/L   Chloride 110 101 - 111 mmol/L   CO2 18 (L) 22 - 32 mmol/L   Glucose, Bld 170 (H) 65 - 99 mg/dL   BUN 63 (H) 6 - 20 mg/dL   Creatinine, Ser 1.42 (H) 0.44 - 1.00 mg/dL   Calcium 9.5 8.9 - 10.3 mg/dL   Total Protein 7.4 6.5 - 8.1 g/dL   Albumin 2.3 (L) 3.5 - 5.0 g/dL   AST 208 (H) 15 - 41 U/L   ALT 170 (H) 14 - 54 U/L   Alkaline Phosphatase 1,095 (H) 38 - 126 U/L   Total Bilirubin 6.2 (H) 0.3 - 1.2 mg/dL   GFR calc non Af Amer 38 (L) >60 mL/min   GFR calc Af Amer 45 (L) >60 mL/min    Comment: (NOTE) The eGFR has been calculated using the CKD EPI equation. This calculation has not been validated in all clinical situations. eGFR's persistently <60 mL/min signify possible Chronic Kidney Disease.    Anion gap 7 5 - 15    Comment: Performed at [facility],  [place] [address] [place], [place] [address]   Glucose, capillary     Status: Abnormal   Collection Time: 10/18/17  7:21 AM  Result Value Ref Range   Glucose-Capillary 163 (H) 65 - 99 mg/dL  Glucose, capillary     Status: Abnormal   Collection Time: 10/18/17  9:26 AM  Result Value Ref Range   Glucose-Capillary 175 (H) 65 - 99 mg/dL  Glucose, capillary     Status: Abnormal   Collection Time: 10/18/17 12:00 PM  Result Value Ref Range   Glucose-Capillary 197 (H) 65 - 99 mg/dL  Glucose, capillary     Status: Abnormal   Collection Time: 10/18/17  5:23 PM  Result Value Ref Range   Glucose-Capillary 289 (H) 65 - 99 mg/dL  Glucose, capillary     Status: Abnormal   Collection Time: 10/18/17  9:28 PM  Result Value Ref Range   Glucose-Capillary 263 (H) 65 - 99 mg/dL  CBC     Status: Abnormal   Collection Time: 10/19/17  6:44 AM  Result Value Ref Range   WBC 11.4 (H) 4.0 - 10.5 K/uL   RBC 3.42 (L) 3.87 - 5.11 MIL/uL   Hemoglobin 9.8 (L) 12.0 - 15.0 g/dL   HCT 31.3 (L) 36.0 - 46.0 %   MCV 91.5 78.0 - 100.0 fL   MCH 28.7 26.0 - 34.0 pg   MCHC 31.3 30.0 - 36.0 g/dL   RDW 18.7 (H) 11.5 - 15.5 %   Platelets 397 150 - 400 K/uL    Comment: Performed at [facility], [place] [address], [place], [place] [address]  Comprehensive metabolic panel     Status: Abnormal   Collection Time: 10/19/17  6:44 AM  Result Value Ref Range   Sodium 134 (L) 135 - 145 mmol/L   Potassium 4.5 3.5 - 5.1 mmol/L   Chloride 107 101 - 111 mmol/L   CO2 20 (L) 22 - 32 mmol/L   Glucose, Bld 203 (H) 65 - 99 mg/dL   BUN 33 (H) 6 - 20 mg/dL   Creatinine, Ser 1.16 (H) 0.44 - 1.00 mg/dL   Calcium 9.3 8.9 - 10.3 mg/dL   Total Protein 7.1 6.5 - 8.1 g/dL   Albumin 2.2 (L) 3.5 - 5.0 g/dL   AST 278 (H) 15 - 41 U/L   ALT 201 (H) 14 - 54 U/L   Alkaline Phosphatase 1,268 (H) 38 - 126 U/L   Total Bilirubin 9.4 (H) 0.3 - 1.2 mg/dL   GFR calc non Af Amer 49 (L) >60 mL/min   GFR calc Af Amer 57 (L) >60 mL/min    Comment: (NOTE) The eGFR has been calculated  using the CKD EPI equation. This calculation has not been validated in all clinical situations. eGFR's persistently <60 mL/min signify possible Chronic Kidney Disease.    Anion gap 7 5 - 15    Comment: Performed at [facility], [place] [address], [place], [place] [address]  Magnesium     Status: Abnormal   Collection Time: 10/19/17  6:44 AM  Result Value Ref Range   Magnesium 1.6 (L) 1.7 - 2.4 mg/dL    Comment: Performed at [facility], [place] [address] [place], [place] [address]  Glucose, capillary     Status: Abnormal   Collection Time: 10/19/17  7:54 AM  Result Value Ref Range   Glucose-Capillary 188 (H) 65 - 99 mg/dL  Glucose, capillary     Status: Abnormal   Collection Time:  10/19/17 11:27 AM  Result Value Ref Range   Glucose-Capillary 298 (H) 65 - 99 mg/dL    Studies/Results: Dg Chest 2 View  Result Date: 10/17/2017 CLINICAL DATA:  Mid chest pain and shortness of breath, history of liver metastatic disease EXAM: CHEST  2 VIEW COMPARISON:  None. FINDINGS: The heart size and mediastinal contours are within normal limits. Both lungs are clear. The visualized skeletal structures are unremarkable. IMPRESSION: No active cardiopulmonary disease. Electronically Signed   By: Inez Catalina M.D.   On: 10/17/2017 11:48    Medications: I have reviewed the patient's current medications.   Assessment: 1. 3 cm deep, cratered duodenal bulb ulcer treated with epinephrine injection and bipolar cautery causing anemia and melena. 2. Newly diagnosed gallbladder cancer with multiple hepatic metastases  Plan: 1. Continue Protonix drip at 8 mg per hour today, transition to a PPI twice a day from tomorrow. Biopsies pending. Okay to start regular diet. Hemoglobin stable between 9.9/9.8, BUN has trended down from 63-33. Remains hemodynamically stable, has not required further blood transfusions.  2. Patient complains of pruritus likely from obstructive jaundice  due to multiple hepatic metastases. Will start patient on ursodiol 300 mg 3 times a day, okay to take antihistamines such as Benadryl as needed.  Advise patient to take MiraLAX 17 g once to twice a day as needed.  3. Patient to follow up at [facility] on Monday as an outpatient.   Ronnette Juniper 10/19/2017, 11:32 AM   Pager [phone] If no answer or after 5 PM call [phone]

## 2017-10-19 NOTE — Progress Notes (Signed)
  Oncology Nurse Navigator Documentation  Navigator Location: [facility] (10/19/17 7341) Referral date to RadOnc/MedOnc: 10/08/17 (10/19/17 9379) )Navigator Encounter Type: Other (10/19/17 0240)   Abnormal Finding Date: 08/27/17 (10/19/17 9735) Confirmed Diagnosis Date: 09/21/17 (10/19/17 3299)               Patient Visit Type: Inpatient (10/19/17 2426)       Interventions: Psycho-social support (10/19/17 0954)  Met with patient and husband on inpatient unit to provide psycho-social support. Patient reports having a supportive husband and daughter as well as extended family and church family support. No barriers identified.          Acuity: Level 2 (10/19/17 0954)         Time Spent with Patient: 30 (10/19/17 0954)

## 2017-10-19 NOTE — Progress Notes (Signed)
Initial Nutrition Assessment  DOCUMENTATION CODES:   Obesity unspecified  INTERVENTION:   RD to order Unjury (chicken soup flavor) BID between meals.  NUTRITION DIAGNOSIS:   Increased nutrient needs related to cancer and cancer related treatments as evidenced by estimated needs.  GOAL:   Patient will meet greater than or equal to 90% of their needs  MONITOR:   PO intake, Supplement acceptance, Diet advancement, Weight trends, Labs  REASON FOR ASSESSMENT:   Malnutrition Screening Tool    ASSESSMENT:   62 y.o. 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 Admitted with symptomatic anemia, dehydration, loss of appetite, poor oral intake, N/V, tarry stools. S/P EGD. Oncology consult pending.   Pt denies current N/V/D. PTA pt reports on and off N/V for the past month. The pt states she experienced stomach pain with all types of foods and sometimes liquids. Pt reports poor p.o. Intake for the past month, consuming mostly bites of foods over the course of the day.  Pt reports consuming 100% of breakfast this morning which was confirmed by intern observation. Pt consumed chicken broth, ice cream, and juice without reported issue. Pt consumed the same meal for breakfast, lunch, and dinner yesterday and states she consumed 100% of each meal.   Pt reports checking her blood sugar as much as 6x/day since December 2018 due to very sporadic numbers (70s-300s). Before December pt reports checking blood sugar 2x/day with values typically between 100-160s.   Pt reports a 30lb wt loss over the past 2-3 months. Pt remembers a UBW of 222lb in December 2018. Per pt reported UBW and current recorded wt, pt has lost 21lb (9.5%) over the past ~2 months which is significant for time frame. Further recent weight history cannot be obtained from the medical record.   Malnutrition suspected given significant recent wt loss and poor p.o.  Intake for the past month. However, malnutrition cannot be diagnosed at this time due to lack of medical record weight history and obesity status masking possible muscle/fat depletions.   Medications: Insulin, sodium bicarbonate, magnesium sulfate (1x IV dose)  Labs: Na (L), Mg (L).  CBG (last 3)  Recent Labs    10/18/17 2128 10/19/17 0754 10/19/17 1127  GLUCAP 263* 188* 298*    NUTRITION - FOCUSED PHYSICAL EXAM:    Most Recent Value  Orbital Region  No depletion  Upper Arm Region  Severe depletion  Thoracic and Lumbar Region  No depletion  Buccal Region  No depletion  Temple Region  Moderate depletion  Clavicle Bone Region  No depletion  Clavicle and Acromion Bone Region  No depletion  Scapular Bone Region  No depletion  Dorsal Hand  No depletion  Patellar Region  Mild depletion  Anterior Thigh Region  Mild depletion  Posterior Calf Region  Mild depletion     depletions may be masked by pt's BMI (>30;obese) status.   Diet Order:  Diet Carb Modified Fluid consistency: Thin; Room service appropriate? Yes  EDUCATION NEEDS:   No education needs have been identified at this time  Skin:  Skin Assessment: Reviewed RN Assessment  Last BM:  PTA  Height:   Ht Readings from Last 1 Encounters:  10/18/17 5\' 6"  (1.676 m)    Weight:   Wt Readings from Last 1 Encounters:  10/18/17 201 lb (91.2 kg)    Ideal Body Weight:  59.1 kg  BMI:  Body mass index is 32.44 kg/m.  Estimated Nutritional Needs:   Kcal:  1017-5102  Protein:  95-105  Fluid:  >/= 1.8L    Lidie Glade, MS, Dietetic Intern Pager # [phone]

## 2017-10-19 NOTE — Progress Notes (Signed)
PROGRESS NOTE    PALMA BUSTER  MWN:[id number] DOB: [birth year] DOA: 10/17/2017 PCP: Wenda Low, MD    Brief Narrative:  Patient is 62 year old female history of insulin-dependent diabetes, hypertension, coronary artery disease recently diagnosed with primary pancreatobiliary adenocarcinoma who presents to the ED with generalized weakness, shortness of breath, decreased oral intake, nausea vomiting, questionable melanotic stools.  Patient seen at PCPs office noted to be anemic and dehydrated with a worsening renal function and sent to the ED.  On presentation to the ED patient was noted to have a hemoglobin of 7.4, elevated alk phosphatase, AST and ALT as well as bilirubin.  FOBT was positive.  GI consulted and patient underwent upper endoscopy 10/18/2017.  Oncology consultation pending.   Assessment & Plan:   Principal Problem:   Symptomatic anemia Active Problems:   Duodenal ulcer: Per EGD 10/18/2017   Esophageal ulceration: EGD 10/18/2017   CAD (coronary artery disease)   HTN (hypertension)   Hyperlipidemia   Anemia   Type 2 diabetes mellitus without complication (HCC)   Hyponatremia   1 symptomatic anemia secondary to duodenal ulcer and esophageal ulceration per EGD of 10/18/2017. Patient Presented with symptomatic anemia including shortness of breath and fatigue, generalized weakness, oral intake.  Patient was noted on admission to have a hemoglobin of 7.4 with a drop from 10.6 a few weeks ago.  Patient status post 2 units packed red blood cells hemoglobin currently stable at 9.8.  Patient currently on a Protonix drip for at least 72 hours.  Patient status post upper endoscopy done per Dr. Therisa Doyne 10/18/2017 which showed superficial linear ulceration noted in the distal esophagus biopsies taken and sent.  Large 3 cm partially obstructive deep cratered ulcer noted at the duodenal bulb with small clot, pigmentation otherwise clean base.  Biopsies sent to rule out malignancy as patient noted  to have a history of gallbladder cancer and liver metastases.  Patient tolerating clear liquid diet and diet has been advanced to a solid diet per GI.  Follow H&H.  Transfusion threshold hemoglobin less than 7.  Per GI patient with increased risk of rebleeding due to size of ulcer and increased chance of rebleeding recommending continue IV PPI for total of 72 hours prior to switching to twice daily.  GI following and appreciate input and recommendations.  2.  Hyponatremia Secondary to hypovolemic hyponatremia.  Improving with hydration.  Follow.  3.  Insulin dependent type 2 diabetes Check a hemoglobin A1c.  CBGs ranging from 188-298.  Resume Lantus at 20 units daily.  Continue sliding scale insulin.  4.  Hypertension/CAD Blood pressure elevated.  Increase Coreg to 12.5 mg twice daily.  Start Norvasc 10 mg daily.  ACE inhibitor and diuretic on hold secondary to acute renal failure.  5.  Hyperlipidemia Hold statin secondary to elevated LFTs.  Repeat LFTs in the morning.  6.  Acute renal failure Secondary to prerenal azotemia in the setting of ACE inhibitor and diuretic.  ACE inhibitor and diuretics on hold.  Renal function improving with hydration.  Follow.  7.  Metabolic acidosis Improving on bicarb tablets.    8.  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  Patient with a transaminitis on admission.  CEA, AFP, CA-19-9 ordered and pending.  Patient has been seen in consultation by oncology who are recommending outpatient follow-up with the cancer center  on 10/22/2017.  Repeat hepatic panel tomorrow as per GI and oncology patient with pruritus.  Will defer repeat imaging to gastroenterology.  Patient has been started on ursodiol per GI for pruritus.     DVT prophylaxis:  SCDs Code Status: Full Family Communication: Updated Patient and family at bedside. Disposition Plan: Likely home once medically stable with clinical improvement.    Consultants:   Gastroenterology: Dr. Therisa Doyne 10/17/2017  Oncology: Dr. Benay Spice 10/19/2017  Procedures:   Upper endoscopy--superficial linear ulceration noted in the distal esophagus, large 3 cm partially obstructing deep cratered ulcer noted in the duodenal bulb with small clot, pigmentation otherwise clean base.  Biopsies taken.  Status post injection of 4 mL of epinephrine and bipolar cautery per Dr. Therisa Doyne 10/18/2017.  Transfusion of 2 units packed red blood cells 10/17/2017  Chest x-ray 10/17/2017    Antimicrobials:   None   Subjective: Patient states she feels better today.  Shortness of breath improved.  Fatigue improved.  Patient denies any melanotic stools or hematemesis.  Patient states diet was advanced per gastroenterologist.    Objective: Vitals:   10/18/17 0945 10/18/17 1351 10/18/17 2130 10/19/17 0553  BP: (!) 144/66 (!) 141/67 (!) 162/66 (!) 170/70  Pulse: 61 65 65 65  Resp: '20 19 19 20  '$ Temp: 98 F (36.7 C) 98.4 F (36.9 C) 98.9 F (37.2 C) 98.5 F (36.9 C)  TempSrc: Oral Oral Oral Oral  SpO2: 93% 100% 98% 100%  Weight:      Height:        Intake/Output Summary (Last 24 hours) at 10/19/2017 1151 Last data filed at 10/19/2017 0600 Gross per 24 hour  Intake 1876.25 ml  Output -  Net 1876.25 ml   Filed Weights   10/18/17 0715  Weight: 91.2 kg (201 lb)    Examination:  General exam: NAD. Respiratory system: Clear to auscultation.  Wheezes, no crackles, no rhonchi.  Respiratory effort normal. Cardiovascular system: Regular rate and rhythm no murmurs rubs or gallops.  No JVD.  No lower extremity edema.  Gastrointestinal system: Abdomen is soft, nondistended, positive bowel sounds.  Nondistended, soft and less tender to palpation on the right side. Normal bowel sounds heard. Central nervous  system: Alert and oriented. No focal neurological deficits. Extremities: Symmetric 5 x 5 power. Skin: No rashes, lesions or ulcers Psychiatry: Judgement and insight appear normal. Mood & affect appropriate.     Data Reviewed: I have personally reviewed following labs and imaging studies  CBC: Recent Labs  Lab 10/17/17 1133 10/18/17 0636 10/19/17 0644  WBC 11.1* 12.5* 11.4*  NEUTROABS 8.6*  --   --   HGB 7.4* 9.9* 9.8*  HCT 24.2* 30.7* 31.3*  MCV 94.2 90.8 91.5  PLT 398 411* 165   Basic Metabolic Panel: Recent Labs  Lab 10/17/17 1133 10/18/17 0636 10/19/17 0644  NA 129* 135 134*  K 4.2 4.6 4.5  CL 102 110 107  CO2 16* 18* 20*  GLUCOSE 229* 170* 203*  BUN 84* 63* 33*  CREATININE 1.93* 1.42* 1.16*  CALCIUM 9.3 9.5 9.3  MG  --   --  1.6*   GFR: Estimated Creatinine Clearance: 56.5 mL/min (A) (by C-G formula based on SCr of 1.16 mg/dL (H)). Liver Function Tests: Recent Labs  Lab 10/17/17 1133 10/18/17 0636 10/19/17 0644  AST 171* 208* 278*  ALT 159* 170* 201*  ALKPHOS 1,004* 1,095* 1,268*  BILITOT 5.8* 6.2* 9.4*  PROT 7.6 7.4 7.1  ALBUMIN 2.4* 2.3* 2.2*   No results  for input(s): LIPASE, AMYLASE in the last 168 hours. No results for input(s): AMMONIA in the last 168 hours. Coagulation Profile: Recent Labs  Lab 10/17/17 1334  INR 1.42   Cardiac Enzymes: No results for input(s): CKTOTAL, CKMB, CKMBINDEX, TROPONINI in the last 168 hours. BNP (last 3 results) No results for input(s): PROBNP in the last 8760 hours. HbA1C: No results for input(s): HGBA1C in the last 72 hours. CBG: Recent Labs  Lab 10/18/17 1200 10/18/17 1723 10/18/17 2128 10/19/17 0754 10/19/17 1127  GLUCAP 197* 289* 263* 188* 298*   Lipid Profile: No results for input(s): CHOL, HDL, LDLCALC, TRIG, CHOLHDL, LDLDIRECT in the last 72 hours. Thyroid Function Tests: No results for input(s): TSH, T4TOTAL, FREET4, T3FREE, THYROIDAB in the last 72 hours. Anemia Panel: Recent Labs     10/17/17 1319 10/17/17 1320  VITAMINB12  --  7,151*  FOLATE 21.9  --   FERRITIN  --  562*  TIBC  --  272  IRON  --  51  RETICCTPCT 6.1*  --    Sepsis Labs: No results for input(s): PROCALCITON, LATICACIDVEN in the last 168 hours.  No results found for this or any previous visit (from the past 240 hour(s)).       Radiology Studies: No results found.      Scheduled Meds: . amLODipine  10 mg Oral Daily  . carvedilol  12.5 mg Oral BID WC  . insulin aspart  0-9 Units Subcutaneous TID WC  . latanoprost  1 drop Both Eyes Daily  . ondansetron (ZOFRAN) IV  4 mg Intravenous Once  . [START ON 10/21/2017] pantoprazole  40 mg Intravenous Q12H  . polyethylene glycol  17 g Oral Daily  . protein supplement  8 oz Oral BID  . sodium bicarbonate  650 mg Oral TID  . ursodiol  300 mg Oral TID   Continuous Infusions: . sodium chloride 75 mL/hr at 10/18/17 1245  . magnesium sulfate 1 - 4 g bolus IVPB 4 g (10/19/17 1054)  . pantoprozole (PROTONIX) infusion 8 mg/hr (10/19/17 0030)     LOS: 2 days    Time spent: 35 minutes    Irine Seal, MD [facility] Pager [phone]  If 7PM-7AM, please contact night-coverage [email or web address] Password [facility] 10/19/2017, 11:51 AM

## 2017-10-20 DIAGNOSIS — N390 Urinary tract infection, site not specified: Secondary | ICD-10-CM

## 2017-10-20 LAB — URINALYSIS, ROUTINE W REFLEX MICROSCOPIC
Bilirubin Urine: 3 — AB
GLUCOSE, UA: 100 mg/dL — AB
HGB URINE DIPSTICK: 3 — AB
Ketones, ur: NEGATIVE mg/dL
LEUKOCYTES UA: 2 — AB
Nitrite: NEGATIVE
Protein, ur: 30 mg/dL — AB
SPECIFIC GRAVITY, URINE: 1.02 (ref 1.005–1.030)
pH: 6.5 (ref 5.0–8.0)

## 2017-10-20 LAB — HEPATIC FUNCTION PANEL
ALK PHOS: 1193 U/L — AB (ref 38–126)
ALT: 185 U/L — AB (ref 14–54)
AST: 164 U/L — ABNORMAL HIGH (ref 15–41)
Albumin: 2.3 g/dL — ABNORMAL LOW (ref 3.5–5.0)
BILIRUBIN DIRECT: 7.2 mg/dL — AB (ref 0.1–0.5)
BILIRUBIN INDIRECT: 4.2 mg/dL — AB (ref 0.3–0.9)
BILIRUBIN TOTAL: 11.4 mg/dL — AB (ref 0.3–1.2)
Total Protein: 7.3 g/dL (ref 6.5–8.1)

## 2017-10-20 LAB — GLUCOSE, CAPILLARY
GLUCOSE-CAPILLARY: 214 mg/dL — AB (ref 65–99)
Glucose-Capillary: 252 mg/dL — ABNORMAL HIGH (ref 65–99)
Glucose-Capillary: 192 mg/dL — ABNORMAL HIGH (ref 65–99)

## 2017-10-20 LAB — CBC
HEMATOCRIT: 30 % — AB (ref 36.0–46.0)
HEMOGLOBIN: 9.6 g/dL — AB (ref 12.0–15.0)
MCH: 29.5 pg (ref 26.0–34.0)
MCHC: 32 g/dL (ref 30.0–36.0)
MCV: 92.3 fL (ref 78.0–100.0)
Platelets: 377 10*3/uL (ref 150–400)
RBC: 3.25 MIL/uL — AB (ref 3.87–5.11)
RDW: 18.6 % — ABNORMAL HIGH (ref 11.5–15.5)
WBC: 11.8 10*3/uL — ABNORMAL HIGH (ref 4.0–10.5)

## 2017-10-20 LAB — BASIC METABOLIC PANEL
Anion gap: 7 (ref 5–15)
BUN: 24 mg/dL — AB (ref 6–20)
CO2: 23 mmol/L (ref 22–32)
CREATININE: 1.07 mg/dL — AB (ref 0.44–1.00)
Calcium: 9.3 mg/dL (ref 8.9–10.3)
Chloride: 103 mmol/L (ref 101–111)
GFR calc Af Amer: >60 mL/min (ref >60)
GFR, EST NON AFRICAN AMERICAN: 54 mL/min — AB (ref >60)
GLUCOSE: 225 mg/dL — AB (ref 65–99)
Potassium: 5 mmol/L (ref 3.5–5.1)
SODIUM: 133 mmol/L — AB (ref 135–145)

## 2017-10-20 LAB — URINALYSIS, MICROSCOPIC (REFLEX)

## 2017-10-20 LAB — MAGNESIUM: Magnesium: 2 mg/dL (ref 1.7–2.4)

## 2017-10-20 MED ORDER — POLYETHYLENE GLYCOL 3350 17 G PO PACK: 17.0000 g | PACK | Freq: Every day | ORAL | 0 refills | Status: AC

## 2017-10-20 MED ORDER — PANTOPRAZOLE SODIUM 40 MG PO TBEC: 40.0000 mg | DELAYED_RELEASE_TABLET | Freq: Two times a day (BID) | ORAL | 0 refills | Status: AC

## 2017-10-20 MED ORDER — NITROFURANTOIN MONOHYD MACRO 100 MG PO CAPS: 100.0000 mg | ORAL_CAPSULE | Freq: Two times a day (BID) | ORAL | 0 refills | Status: AC

## 2017-10-20 MED ORDER — TRAMADOL HCL 50 MG PO TABS
50.0000 mg | ORAL_TABLET | Freq: Two times a day (BID) | ORAL | Status: AC | PRN
  Administered 2017-10-20 (×2): 50 mg via ORAL
  Filled 2017-10-20 (×2): qty 1

## 2017-10-20 MED ORDER — ACETAMINOPHEN 500 MG PO TABS: 500.0000 mg | ORAL_TABLET | Freq: Four times a day (QID) | ORAL | 0 refills | Status: DC | PRN

## 2017-10-20 MED ORDER — NITROFURANTOIN MONOHYD MACRO 100 MG PO CAPS
100.0000 mg | ORAL_CAPSULE | Freq: Two times a day (BID) | ORAL | Status: DC
  Filled 2017-10-20: qty 1

## 2017-10-20 MED ORDER — TRAMADOL HCL 50 MG PO TABS: 50.0000 mg | ORAL_TABLET | Freq: Two times a day (BID) | ORAL | 0 refills | Status: DC | PRN

## 2017-10-20 MED ORDER — UNJURY CHICKEN SOUP POWDER: 8.0000 [oz_av] | Freq: Two times a day (BID) | ORAL | Status: DC

## 2017-10-20 MED ORDER — URSODIOL 300 MG PO CAPS: 300.0000 mg | ORAL_CAPSULE | Freq: Three times a day (TID) | ORAL | 0 refills | Status: AC

## 2017-10-20 NOTE — Discharge Summary (Signed)
Physician Discharge Summary  Julie Barker WEX:[id number] DOB: 1956-05-14 DOA: 10/17/2017  PCP: Wenda Low, MD  Admit date: 10/17/2017 Discharge date: 10/20/2017  Time spent: 65 minutes  Recommendations for Outpatient Follow-up:  1. Follow-up with Dr. Benay Spice, oncology on 10/22/2017 as scheduled. 2. Follow-up with Dr. Therisa Doyne, gastroenterology in 2-3 weeks. 3. Follow-up with Wenda Low, MD in 2 weeks.  On follow-up patient will need a CBC done to follow-up on H&H.  Patient will also need a comprehensive metabolic profile done to follow-up on electrolytes renal function and LFTs.  Patient's blood pressure need to be reassessed as patient's HCTZ and ACE inhibitor was discontinued during this hospitalization secondary to acute renal failure.  Urine cultures will need to be followed up upon.   Discharge Diagnoses:  Principal Problem:   Symptomatic anemia Active Problems:   Duodenal ulcer: Per EGD 10/18/2017   Esophageal ulceration: EGD 10/18/2017   CAD (coronary artery disease)   HTN (hypertension)   Hyperlipidemia   Anemia   Type 2 diabetes mellitus without complication (HCC)   Hyponatremia   Discharge Condition: Stable and improved  Diet recommendation: Carb modified diet  Filed Weights   10/18/17 0715  Weight: 91.2 kg (201 lb)    History of present illness:  Per Dr Erlinda Hong History of insulin-dependent diabetes, hypertension, coronary artery disease, recently diagnosed primary pancreatobiliary adenocarcinoma (s/p biopsy in January).  Developed weakness,DOE, Decreased oral intake, n/v. Last vomited a few days ago, denied hematemsis.  She reported stool was light color then became dark a few days ago. She reported right sided abdominal pain only when she laid on right side, she reported central chest pain with eating, otherwise no chest pain. she denied fever. No cough, no edema.  she was evaluated by primary care doctor the day prior to admission, found to have anemia, dehydration  and  elevated creatinine, she was instructed to come to the ED.  ED course: Vital signs are stable.  Denied pain.  Hemoglobin 7.4, dropped from 10.6 a few weeks ago.  Platelet 398, INR 1.4.  Sodium 129, BUN 84, creatinine 1.93, lak phos 1000, AST 171, ALT 159, total bilirubin 5.8.  No baseline labs.  Troponin negative.  EKG no acute changes.  Stool guaiac positive.  She received 1 L of normal saline, 2 units PRBC ordered in the ED. Hospitalist called to admit the patient.    Hospital Course:  1 symptomatic anemia secondary to duodenal ulcer and esophageal ulceration per EGD of 10/18/2017. Patient presented with symptomatic anemia including shortness of breath and fatigue, generalized weakness, oral intake.  Patient was noted on admission to have a hemoglobin of 7.4 with a drop from 10.6 a few weeks ago.  Patient status post 2 units packed red blood cells hemoglobin currently stable at 9.6, by day of discharge.  Patient was placed on a Protonix drip for at least 72 hours per GI recommendations and then to be transition to Protonix twice daily. Patient status post upper endoscopy done per Dr. Therisa Doyne 10/18/2017 which showed superficial linear ulceration noted in the distal esophagus biopsies taken and sent.  Large 3 cm partially obstructive deep cratered ulcer noted at the duodenal bulb with small clot, pigmentation otherwise clean base.  Biopsies sent to rule out malignancy as patient noted to have a history of gallbladder cancer and liver metastases.  Patient  was subsequently started on a clear liquid diet and subsequently advanced to a solid diet which he tolerated.  Patient did not have any rebleeding episodes.  Patient's hemoglobin stabilized.  Patient will be discharged home on Protonix twice daily.  Patient is to avoid all NSAIDs and aspirin.  Patient will follow up with gastroenterology in the outpatient setting in 2-3 weeks.   2.  Hyponatremia Secondary to hypovolemic hyponatremia.  Improved with  hydration.  Follow.  3.  Insulin dependent type 2 diabetes Patient was resumed back on her home regimen of Lantus and sliding scale insulin.  4.  Hypertension/CAD His antihypertensive medications were initially held on admission due to patient's presentation with probable upper GI bleed.  Once patient had an upper endoscopy.was tolerating oral intake patient was resumed back on her antihypertensive medications gradually included her Coreg and Norvasc.  ACE inhibitor and diuretic were discontinued due to acute renal failure.  Blood pressure improved. Imdur will be resumed on discharge.  Outpatient follow-up with PCP.   5.  Hyperlipidemia Patient statin was discontinued during the hospitalization secondary to elevated LFTs.  Will defer resumption of statin to PCP in the outpatient setting.    6.  Acute renal failure Secondary to prerenal azotemia in the setting of ACE inhibitor and diuretic.  ACE inhibitor and diuretics  discontinued.  Patient hydrated with IV fluids with improvement in renal function.  By day of discharge patient's creatinine was down to 1.07.  Outpatient follow-up.   7.  Metabolic acidosis Improved on bicarb tablets.   Outpatient follow-up.   8.  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  Patient with a transaminitis on admission.  CEA, AFP, CA-19-9 ordered with AFP, not 1.5, CA-19-9 at 8 and CEA at 8.4. Patient has been seen in consultation by oncology who are recommending outpatient follow-up with the cancer center on 10/22/2017.  Patient noted to have elevated bilirubin levels felt to be secondary to liver metastases.  Patient was started on ursodiol per GI for pruritus with improvement with pruritus.  Outpatient follow-up..  9.  Probable UTI On day of  discharge patient complained of dark urine.  Urinalysis which was done was concerning for probable UTI and also bilirubin.  Urine cultures pending at time of discharge.  Patient will be discharged home on Macrobid 100 mg twice daily times 5 days.  Urine cultures will need to be followed up on an outpatient setting.      Procedures:  Upper endoscopy--superficial linear ulceration noted in the distal esophagus, large 3 cm partially obstructing deep cratered ulcer noted in the duodenal bulb with small clot, pigmentation otherwise clean base.  Biopsies taken.  Status post injection of 4 mL of epinephrine and bipolar cautery per Dr. Therisa Doyne 10/18/2017.  Transfusion of 2 units packed red blood cells 10/17/2017  Chest x-ray 10/17/2017      Consultations:  Gastroenterology: Dr. Therisa Doyne 10/17/2017  Oncology: Dr. Benay Spice 10/19/2017      Discharge Exam: Vitals:   10/20/17 0900 10/20/17 1453  BP: (!) 149/73 (!) 155/69  Pulse: 65 67  Resp: 20 20  Temp: 98 F (36.7 C) (!) 97.4 F (36.3 C)  SpO2:  100%    General: NAD Cardiovascular: RRR Respiratory: CTAB  Discharge Instructions   Discharge Instructions    Diet Carb Modified   Complete by:  As directed    Discharge instructions   Complete by:  As directed    DO NOT USE ANY NSAIDS (ibuprofen, advil, goody's powder, etc) or aspirin use.  Increase activity slowly   Complete by:  As directed      Allergies as of 10/20/2017   No Known Allergies     Medication List    STOP taking these medications   aspirin 81 MG tablet   hydrochlorothiazide 25 MG tablet Commonly known as:  HYDRODIURIL   ramipril 10 MG capsule Commonly known as:  ALTACE   simvastatin 20 MG tablet Commonly known as:  ZOCOR     TAKE these medications   acetaminophen 500 MG tablet Commonly known as:  TYLENOL Take 1 tablet (500 mg total) by mouth every 6 (six) hours as needed for mild pain or moderate pain. What changed:  how much to take   amLODipine 10  MG tablet Commonly known as:  NORVASC Take 10 mg by mouth every morning.   carvedilol 25 MG tablet Commonly known as:  COREG TAKE ONE TABLET BY MOUTH TWICE DAILY WITH MEALS   insulin glargine 100 UNIT/ML injection Commonly known as:  LANTUS Inject 25 Units into the skin 2 (two) times daily.   isosorbide mononitrate 60 MG 24 hr tablet Commonly known as:  IMDUR Take 60 mg by mouth every morning.   latanoprost 0.005 % ophthalmic solution Commonly known as:  XALATAN Place 1 drop into both eyes daily.   NOVOLOG FLEXPEN 100 UNIT/ML FlexPen Generic drug:  insulin aspart Inject 2-10 Units into the skin 2 (two) times daily. Per sliding scale   pantoprazole 40 MG tablet Commonly known as:  PROTONIX Take 1 tablet (40 mg total) by mouth 2 (two) times daily before a meal.   polyethylene glycol packet Commonly known as:  MIRALAX / GLYCOLAX Take 17 g by mouth daily. Start taking on:  10/21/2017   protein supplement Powd Commonly known as:  UNJURY CHICKEN SOUP Take 27 g (8 oz total) by mouth 2 (two) times daily.   traMADol 50 MG tablet Commonly known as:  ULTRAM Take 1 tablet (50 mg total) by mouth every 12 (twelve) hours as needed for moderate pain or severe pain.   ursodiol 300 MG capsule Commonly known as:  ACTIGALL Take 1 capsule (300 mg total) by mouth 3 (three) times daily.      No Known Allergies Follow-up Information    Ronnette Juniper, MD. Schedule an appointment as soon as possible for a visit in 2 week(s).   Specialty:  Gastroenterology Why:  f/u in 2-3 weeks Contact information: [place] [address] [phone]        Wenda Low, MD. Schedule an appointment as soon as possible for a visit in 2 week(s).   Specialty:  Internal Medicine Contact information: [address] [place] [address] [phone]        Ladell Pier, MD Follow up on 10/22/2017.   Specialty:  Oncology Why:  f/u as scheduled. Contact  information: [place] [address] [phone]            The results of significant diagnostics from this hospitalization (including imaging, microbiology, ancillary and laboratory) are listed below for reference.    Significant Diagnostic Studies: Dg Chest 2 View  Result Date: 10/17/2017 CLINICAL DATA:  Mid chest pain and shortness of breath, history of liver metastatic disease EXAM: CHEST  2 VIEW COMPARISON:  None. FINDINGS: The heart size and mediastinal contours are within normal limits. Both lungs are clear. The visualized skeletal structures are unremarkable. IMPRESSION: No active cardiopulmonary disease. Electronically Signed  By: Inez Catalina M.D.   On: 10/17/2017 11:48   Mr Abdomen Wwo Contrast  Result Date: 10/14/2017 CLINICAL DATA:  Liver mass, with biopsy-proven adenocarcinoma EXAM: MRI ABDOMEN WITHOUT AND WITH CONTRAST TECHNIQUE: Multiplanar multisequence MR imaging of the abdomen was performed both before and after the administration of intravenous contrast. CONTRAST:  59mL MULTIHANCE GADOBENATE DIMEGLUMINE 529 MG/ML IV SOLN COMPARISON:  CT abdomen/pelvis dated 08/17/2017 FINDINGS: Lower chest: Lung bases are clear. Hepatobiliary: Abnormal, mass-like appearance of the gallbladder (series 8/image 26), which directly invades into the adjacent hepatic parenchyma along the gallbladder fossa, suggesting gallbladder adenocarcinoma. Tumor is inseparable between the gallbladder and liver. Adjacent multifocal hepatic metastases which have progressed from prior CT, including: --2.1 cm lesion in segment 4B (series 15/image 42), previously 1.3 cm --1.6 cm lesion in segment 4B adjacent to the falciform ligament (series 15/image 53), possibly new --1.8 cm subcapsular lesion in segment 5 (series 15/image 60), previously 1.6 cm --2.1 cm lesion posteriorly in segment 5 (series 15/image 62), difficult to discretely visualized on CT Pancreas:  Within normal limits. Spleen:   Within normal limits. Adrenals/Urinary Tract:  Adrenal glands are within normal limits. Severe right renal scarring/atrophy. Multiple bilateral renal cysts. No hydronephrosis. Stomach/Bowel: Stomach is within normal limits. Visualized bowel is grossly unremarkable. Vascular/Lymphatic:  No evidence of abdominal aortic aneurysm. Large nodal metastases in the upper abdomen, progressed from recent CT, including: --3.1 cm short axis node in the porta pedis (series 8/image 16) --2.5 cm short axis peripancreatic node (series 8/image 18), previously 1.4 cm --2.7 cm short axis portacaval node (series 8/image 20), previously 2.1 cm Other:  No abdominal ascites. Musculoskeletal: No focal osseous lesions. IMPRESSION: Suspected gallbladder adenocarcinoma with direct invasion of the adjacent liver. Multifocal hepatic metastases in both lobes, progressed from recent CT. Progression of upper abdominal nodal metastases. Electronically Signed   By: Julian Hy M.D.   On: 10/14/2017 11:06   US Biopsy (liver)  Result Date: 09/21/2017 INDICATION: 62 year old female with a liver mass adjacent to the gallbladder fossa concerning for gallbladder carcinoma. EXAM: Ultrasound-guided core biopsy of liver lesion MEDICATIONS: None. ANESTHESIA/SEDATION: Moderate (conscious) sedation was employed during this procedure. A total of Versed 1 mg and Fentanyl 50 mcg was administered intravenously. Moderate Sedation Time: 11 minutes. The patient's level of consciousness and vital signs were monitored continuously by radiology nursing throughout the procedure under my direct supervision. FLUOROSCOPY TIME:  Fluoroscopy Time: 0 minutes 0 seconds (0 mGy). COMPLICATIONS: None immediate. PROCEDURE: Informed written consent was obtained from the patient after a thorough discussion of the procedural risks, benefits and alternatives. All questions were addressed. Maximal Sterile Barrier Technique was utilized including caps, mask, sterile gowns,  sterile gloves, sterile drape, hand hygiene and skin antiseptic. A timeout was performed prior to the initiation of the procedure. The right upper quadrant was interrogated with ultrasound. The mass in hepatic segment 5 was easily identified. A suitable skin entry site was selected and marked. Following sterile prep and draped using chlorhexidine, local anesthesia was attained by 1% lidocaine infiltration. A small dermatotomy was made. Under real-time sonographic guidance, a 17 gauge introducer needle was advanced through the liver and positioned at the margin of the mass. Multiple 18 gauge core biopsies were then coaxially obtained using the bio Pince automated biopsy device. Biopsy specimens were placed in formalin and delivered to pathology for further analysis. As the introducer needle was removed, the biopsy tract was embolized with a Gel-Foam slurry. Post biopsy ultrasound imaging demonstrates no evidence of hemorrhage or  other complication. The patient tolerated the procedure well. IMPRESSION: Technically successful ultrasound-guided core biopsy of hepatic mass. Electronically Signed   By: Jacqulynn Cadet M.D.   On: 09/21/2017 14:38    Microbiology: No results found for this or any previous visit (from the past 240 hour(s)).   Labs: Basic Metabolic Panel: Recent Labs  Lab 10/17/17 1133 10/18/17 0636 10/19/17 0644 10/20/17 0636  NA 129* 135 134* 133*  K 4.2 4.6 4.5 5.0  CL 102 110 107 103  CO2 16* 18* 20* 23  GLUCOSE 229* 170* 203* 225*  BUN 84* 63* 33* 24*  CREATININE 1.93* 1.42* 1.16* 1.07*  CALCIUM 9.3 9.5 9.3 9.3  MG  --   --  1.6* 2.0   Liver Function Tests: Recent Labs  Lab 10/17/17 1133 10/18/17 0636 10/19/17 0644 10/20/17 0636  AST 171* 208* 278* 164*  ALT 159* 170* 201* 185*  ALKPHOS 1,004* 1,095* 1,268* 1,193*  BILITOT 5.8* 6.2* 9.4* 11.4*  PROT 7.6 7.4 7.1 7.3  ALBUMIN 2.4* 2.3* 2.2* 2.3*   No results for input(s): LIPASE, AMYLASE in the last 168 hours. No  results for input(s): AMMONIA in the last 168 hours. CBC: Recent Labs  Lab 10/17/17 1133 10/18/17 0636 10/19/17 0644 10/20/17 0636  WBC 11.1* 12.5* 11.4* 11.8*  NEUTROABS 8.6*  --   --   --   HGB 7.4* 9.9* 9.8* 9.6*  HCT 24.2* 30.7* 31.3* 30.0*  MCV 94.2 90.8 91.5 92.3  PLT 398 411* 397 377   Cardiac Enzymes: No results for input(s): CKTOTAL, CKMB, CKMBINDEX, TROPONINI in the last 168 hours. BNP: BNP (last 3 results) No results for input(s): BNP in the last 8760 hours.  ProBNP (last 3 results) No results for input(s): PROBNP in the last 8760 hours.  CBG: Recent Labs  Lab 10/19/17 1127 10/19/17 1645 10/19/17 2110 10/20/17 0745 10/20/17 1218  GLUCAP 298* 277* 278* 214* 252*       Signed:  Irine Seal MD.  [facility] 10/20/2017, 4:26 PM

## 2017-10-20 NOTE — Progress Notes (Signed)
Subjective: The patient was seen and examined at bedside. Has not had a bowel movement since admission. Reports some improvement in pruritus with use of ursodiol. Denies nausea, vomiting, abdominal pain and has been tolerating a regular diet.  Objective: Vital signs in last 24 hours: Temp:  [98 F (36.7 C)-98.9 F (37.2 C)] 98 F (36.7 C) (02/09 0900) Pulse Rate:  [65-71] 65 (02/09 0900) Resp:  [18-20] 20 (02/09 0900) BP: (144-163)/(62-73) 149/73 (02/09 0900) SpO2:  [97 %-100 %] 100 % (02/09 0504) Weight change:  Last BM Date: 10/18/17  PE: Deep icterus, mild pallor GENERAL: Overweight, not in acute distress ABDOMEN: Soft, nondistended, nontender, normoactive bowel sounds EXTREMITIES: No deformity, no edema  Lab Results: Results for orders placed or performed during the hospital encounter of 10/17/17 (from the past 48 hour(s))  Glucose, capillary     Status: Abnormal   Collection Time: 10/18/17 12:00 PM  Result Value Ref Range   Glucose-Capillary 197 (H) 65 - 99 mg/dL  Glucose, capillary     Status: Abnormal   Collection Time: 10/18/17  5:23 PM  Result Value Ref Range   Glucose-Capillary 289 (H) 65 - 99 mg/dL  Glucose, capillary     Status: Abnormal   Collection Time: 10/18/17  9:28 PM  Result Value Ref Range   Glucose-Capillary 263 (H) 65 - 99 mg/dL  CBC     Status: Abnormal   Collection Time: 10/19/17  6:44 AM  Result Value Ref Range   WBC 11.4 (H) 4.0 - 10.5 K/uL   RBC 3.42 (L) 3.87 - 5.11 MIL/uL   Hemoglobin 9.8 (L) 12.0 - 15.0 g/dL   HCT 31.3 (L) 36.0 - 46.0 %   MCV 91.5 78.0 - 100.0 fL   MCH 28.7 26.0 - 34.0 pg   MCHC 31.3 30.0 - 36.0 g/dL   RDW 18.7 (H) 11.5 - 15.5 %   Platelets 397 150 - 400 K/uL    Comment: Performed at [facility], [place] [address] [place], [place] [address]  Comprehensive metabolic panel     Status: Abnormal   Collection Time: 10/19/17  6:44 AM  Result Value Ref Range   Sodium 134 (L) 135 - 145 mmol/L   Potassium  4.5 3.5 - 5.1 mmol/L   Chloride 107 101 - 111 mmol/L   CO2 20 (L) 22 - 32 mmol/L   Glucose, Bld 203 (H) 65 - 99 mg/dL   BUN 33 (H) 6 - 20 mg/dL   Creatinine, Ser 1.16 (H) 0.44 - 1.00 mg/dL   Calcium 9.3 8.9 - 10.3 mg/dL   Total Protein 7.1 6.5 - 8.1 g/dL   Albumin 2.2 (L) 3.5 - 5.0 g/dL   AST 278 (H) 15 - 41 U/L   ALT 201 (H) 14 - 54 U/L   Alkaline Phosphatase 1,268 (H) 38 - 126 U/L   Total Bilirubin 9.4 (H) 0.3 - 1.2 mg/dL   GFR calc non Af Amer 49 (L) >60 mL/min   GFR calc Af Amer 57 (L) >60 mL/min    Comment: (NOTE) The eGFR has been calculated using the CKD EPI equation. This calculation has not been validated in all clinical situations. eGFR's persistently <60 mL/min signify possible Chronic Kidney Disease.    Anion gap 7 5 - 15    Comment: Performed at [facility], [place] [address] [place], [place] [address]  Magnesium     Status: Abnormal   Collection Time: 10/19/17  6:44 AM  Result Value Ref Range  Magnesium 1.6 (L) 1.7 - 2.4 mg/dL    Comment: Performed at [facility], [place] [address] [place], [place] [address]  Glucose, capillary     Status: Abnormal   Collection Time: 10/19/17  7:54 AM  Result Value Ref Range   Glucose-Capillary 188 (H) 65 - 99 mg/dL  Glucose, capillary     Status: Abnormal   Collection Time: 10/19/17 11:27 AM  Result Value Ref Range   Glucose-Capillary 298 (H) 65 - 99 mg/dL  Glucose, capillary     Status: Abnormal   Collection Time: 10/19/17  4:45 PM  Result Value Ref Range   Glucose-Capillary 277 (H) 65 - 99 mg/dL  Glucose, capillary     Status: Abnormal   Collection Time: 10/19/17  9:10 PM  Result Value Ref Range   Glucose-Capillary 278 (H) 65 - 99 mg/dL  Magnesium     Status: None   Collection Time: 10/20/17  6:36 AM  Result Value Ref Range   Magnesium 2.0 1.7 - 2.4 mg/dL    Comment: Performed at [facility], [place] [address], [place], [place] [address]  CBC     Status: Abnormal    Collection Time: 10/20/17  6:36 AM  Result Value Ref Range   WBC 11.8 (H) 4.0 - 10.5 K/uL   RBC 3.25 (L) 3.87 - 5.11 MIL/uL   Hemoglobin 9.6 (L) 12.0 - 15.0 g/dL   HCT 30.0 (L) 36.0 - 46.0 %   MCV 92.3 78.0 - 100.0 fL   MCH 29.5 26.0 - 34.0 pg   MCHC 32.0 30.0 - 36.0 g/dL   RDW 18.6 (H) 11.5 - 15.5 %   Platelets 377 150 - 400 K/uL    Comment: Performed at [facility], [place] [address] [place], [place] [address]  Hepatic function panel     Status: Abnormal   Collection Time: 10/20/17  6:36 AM  Result Value Ref Range   Total Protein 7.3 6.5 - 8.1 g/dL   Albumin 2.3 (L) 3.5 - 5.0 g/dL   AST 164 (H) 15 - 41 U/L   ALT 185 (H) 14 - 54 U/L   Alkaline Phosphatase 1,193 (H) 38 - 126 U/L   Total Bilirubin 11.4 (H) 0.3 - 1.2 mg/dL   Bilirubin, Direct 7.2 (H) 0.1 - 0.5 mg/dL   Indirect Bilirubin 4.2 (H) 0.3 - 0.9 mg/dL    Comment: Performed at [facility], [place] [address] [place], [place] [address]  Basic metabolic panel     Status: Abnormal   Collection Time: 10/20/17  6:36 AM  Result Value Ref Range   Sodium 133 (L) 135 - 145 mmol/L   Potassium 5.0 3.5 - 5.1 mmol/L   Chloride 103 101 - 111 mmol/L   CO2 23 22 - 32 mmol/L   Glucose, Bld 225 (H) 65 - 99 mg/dL   BUN 24 (H) 6 - 20 mg/dL   Creatinine, Ser 1.07 (H) 0.44 - 1.00 mg/dL   Calcium 9.3 8.9 - 10.3 mg/dL   GFR calc non Af Amer 54 (L) >60 mL/min   GFR calc Af Amer >60 >60 mL/min    Comment: (NOTE) The eGFR has been calculated using the CKD EPI equation. This calculation has not been validated in all clinical situations. eGFR's persistently <60 mL/min signify possible Chronic Kidney Disease.    Anion gap 7 5 - 15    Comment: Performed at [facility], [place] [address], [place],  [address]  Glucose,  capillary     Status: Abnormal   Collection Time: 10/20/17  7:45 AM  Result Value Ref Range   Glucose-Capillary 214 (H) 65 - 99 mg/dL   Comment 1 Notify RN      Studies/Results: No results found.  Medications: I have reviewed the patient's current medications.  Assessment: 1. Duodenal ulcer-biopsy showed focal atypia, inflammatory exudate with necrosis. There were epithelioid cells with nuclear enlargement, irregularity and atypia uncertain significance. No H. Pylori, no intestinal metaplasia on biopsy from esophageal ulcer.  2. Metastatic adenocarcinoma of gallbladder with metastatic disease to liver and abdominal lymph nodes. Has been evaluated by Dr. Benay Spice, with plan to follow-up at [facility] on 10/22/17  Plan: 1. Okay to discharge from GI standpoint with PPI twice a day, avoidance of NSAIDs and aspirin. 2. Continue ursodiol 300 mg 3 times a day for pruritus, okay to use Benadryl as needed. 3. Further management as per oncology.    Ronnette Juniper 10/20/2017, 10:53 AM   Pager [phone] If no answer or after 5 PM call [phone]

## 2017-10-20 NOTE — Progress Notes (Signed)
Langston Reusing to be D/C'd Home per MD order.  Discussed prescriptions and follow up appointments with the patient. Prescriptions given to patient, medication list explained in detail. Pt verbalized understanding.  Allergies as of 10/20/2017   No Known Allergies     Medication List    STOP taking these medications   aspirin 81 MG tablet   hydrochlorothiazide 25 MG tablet Commonly known as:  HYDRODIURIL   ramipril 10 MG capsule Commonly known as:  ALTACE   simvastatin 20 MG tablet Commonly known as:  ZOCOR     TAKE these medications   acetaminophen 500 MG tablet Commonly known as:  TYLENOL Take 1 tablet (500 mg total) by mouth every 6 (six) hours as needed for mild pain or moderate pain. What changed:  how much to take   amLODipine 10 MG tablet Commonly known as:  NORVASC Take 10 mg by mouth every morning.   carvedilol 25 MG tablet Commonly known as:  COREG TAKE ONE TABLET BY MOUTH TWICE DAILY WITH MEALS   insulin glargine 100 UNIT/ML injection Commonly known as:  LANTUS Inject 25 Units into the skin 2 (two) times daily.   isosorbide mononitrate 60 MG 24 hr tablet Commonly known as:  IMDUR Take 60 mg by mouth every morning.   latanoprost 0.005 % ophthalmic solution Commonly known as:  XALATAN Place 1 drop into both eyes daily.   nitrofurantoin (macrocrystal-monohydrate) 100 MG capsule Commonly known as:  MACROBID Take 1 capsule (100 mg total) by mouth every 12 (twelve) hours for 5 days.   NOVOLOG FLEXPEN 100 UNIT/ML FlexPen Generic drug:  insulin aspart Inject 2-10 Units into the skin 2 (two) times daily. Per sliding scale   pantoprazole 40 MG tablet Commonly known as:  PROTONIX Take 1 tablet (40 mg total) by mouth 2 (two) times daily before a meal.   polyethylene glycol packet Commonly known as:  MIRALAX / GLYCOLAX Take 17 g by mouth daily. Start taking on:  10/21/2017   protein supplement Powd Commonly known as:  UNJURY CHICKEN SOUP Take 27 g (8 oz  total) by mouth 2 (two) times daily.   traMADol 50 MG tablet Commonly known as:  ULTRAM Take 1 tablet (50 mg total) by mouth every 12 (twelve) hours as needed for moderate pain or severe pain.   ursodiol 300 MG capsule Commonly known as:  ACTIGALL Take 1 capsule (300 mg total) by mouth 3 (three) times daily.       Vitals:   10/20/17 0900 10/20/17 1453  BP: (!) 149/73 (!) 155/69  Pulse: 65 67  Resp: 20 20  Temp: 98 F (36.7 C) (!) 97.4 F (36.3 C)  SpO2:  100%    Skin clean, dry and intact without evidence of skin break down, no evidence of skin tears noted. IV catheter discontinued intact. Site without signs and symptoms of complications. Dressing and pressure applied. Pt denies pain at this time. No complaints noted.  An After Visit Summary was printed and given to the patient. Patient escorted via [place], and D/C home via private auto.  Haywood Lasso BSN, RN [employer] Phone [phone]

## 2017-10-22 ENCOUNTER — Telehealth: Payer: Self-pay | Admitting: Oncology

## 2017-10-22 ENCOUNTER — Inpatient Hospital Stay: Payer: BLUE CROSS/BLUE SHIELD | Attending: Oncology | Admitting: Oncology

## 2017-10-22 ENCOUNTER — Encounter: Payer: Self-pay | Admitting: Oncology

## 2017-10-22 ENCOUNTER — Inpatient Hospital Stay: Payer: BLUE CROSS/BLUE SHIELD

## 2017-10-22 VITALS — BP 126/62 | HR 70 | Temp 98.5°F | Resp 18 | Ht 66.0 in | Wt 190.0 lb

## 2017-10-22 DIAGNOSIS — K831 Obstruction of bile duct: Secondary | ICD-10-CM | POA: Insufficient documentation

## 2017-10-22 DIAGNOSIS — R630 Anorexia: Secondary | ICD-10-CM | POA: Diagnosis not present

## 2017-10-22 DIAGNOSIS — K264 Chronic or unspecified duodenal ulcer with hemorrhage: Secondary | ICD-10-CM | POA: Insufficient documentation

## 2017-10-22 DIAGNOSIS — C23 Malignant neoplasm of gallbladder: Secondary | ICD-10-CM

## 2017-10-22 DIAGNOSIS — C787 Secondary malignant neoplasm of liver and intrahepatic bile duct: Secondary | ICD-10-CM | POA: Diagnosis present

## 2017-10-22 DIAGNOSIS — D500 Iron deficiency anemia secondary to blood loss (chronic): Secondary | ICD-10-CM

## 2017-10-22 DIAGNOSIS — N189 Chronic kidney disease, unspecified: Secondary | ICD-10-CM | POA: Diagnosis not present

## 2017-10-22 DIAGNOSIS — E119 Type 2 diabetes mellitus without complications: Secondary | ICD-10-CM | POA: Diagnosis not present

## 2017-10-22 DIAGNOSIS — D259 Leiomyoma of uterus, unspecified: Secondary | ICD-10-CM | POA: Diagnosis not present

## 2017-10-22 DIAGNOSIS — C801 Malignant (primary) neoplasm, unspecified: Secondary | ICD-10-CM

## 2017-10-22 DIAGNOSIS — Z7189 Other specified counseling: Secondary | ICD-10-CM | POA: Insufficient documentation

## 2017-10-22 LAB — CBC WITH DIFFERENTIAL (CANCER CENTER ONLY)
BASOS PCT: 0 %
Basophils Absolute: 0 10*3/uL (ref 0.0–0.1)
EOS ABS: 0.1 10*3/uL (ref 0.0–0.5)
EOS PCT: 0 %
HCT: 29.6 % — ABNORMAL LOW (ref 34.8–46.6)
Hemoglobin: 9.4 g/dL — ABNORMAL LOW (ref 11.6–15.9)
Lymphocytes Relative: 7 %
Lymphs Abs: 0.9 10*3/uL (ref 0.9–3.3)
MCH: 29.2 pg (ref 25.1–34.0)
MCHC: 31.8 g/dL (ref 31.5–36.0)
MCV: 91.9 fL (ref 79.5–101.0)
Monocytes Absolute: 0.9 10*3/uL (ref 0.1–0.9)
Monocytes Relative: 8 %
Neutro Abs: 10 10*3/uL — ABNORMAL HIGH (ref 1.5–6.5)
Neutrophils Relative %: 85 %
PLATELETS: 346 10*3/uL (ref 145–400)
RBC: 3.22 MIL/uL — AB (ref 3.70–5.45)
RDW: 17.2 % — AB (ref 11.2–14.5)
WBC: 11.8 10*3/uL — AB (ref 3.9–10.3)
nRBC: 0 /100 WBC

## 2017-10-22 LAB — CMP (CANCER CENTER ONLY)
ALBUMIN: 1.9 g/dL — AB (ref 3.5–5.0)
ALT: 115 U/L — ABNORMAL HIGH (ref 0–55)
ANION GAP: 10 (ref 3–11)
AST: 93 U/L — ABNORMAL HIGH (ref 5–34)
Alkaline Phosphatase: 1161 U/L — ABNORMAL HIGH (ref 40–150)
BILIRUBIN TOTAL: 11.1 mg/dL — AB (ref 0.2–1.2)
BUN: 35 mg/dL — ABNORMAL HIGH (ref 7–26)
CO2: 20 mmol/L — ABNORMAL LOW (ref 22–29)
Calcium: 9.8 mg/dL (ref 8.4–10.4)
Chloride: 96 mmol/L — ABNORMAL LOW (ref 98–109)
Creatinine: 1.63 mg/dL — ABNORMAL HIGH (ref 0.60–1.10)
GFR, Est AFR Am: 38 mL/min — ABNORMAL LOW (ref >60)
GFR, Estimated: 33 mL/min — ABNORMAL LOW (ref >60)
GLUCOSE: 345 mg/dL — AB (ref 70–140)
POTASSIUM: 5.2 mmol/L — AB (ref 3.5–5.1)
SODIUM: 126 mmol/L — AB (ref 136–145)
TOTAL PROTEIN: 7 g/dL (ref 6.4–8.3)

## 2017-10-22 NOTE — Progress Notes (Signed)
  Oncology Nurse Navigator Documentation  Navigator Location: [facility] (10/22/17 1614)   )                              Interventions: Other(Foundation One testing request) (10/22/17 1614)  E-mail sent to Signa Kell in pathology requesting Foundation one testing on Liver BX tissue obtained on 09/21/2017.          Acuity: Level 2 (10/22/17 1614)         Time Spent with Patient: 15 (10/22/17 1614)

## 2017-10-22 NOTE — Telephone Encounter (Signed)
Gave patient avs and calendar with appts per 2/11 los.  °

## 2017-10-22 NOTE — Progress Notes (Signed)
[place] OFFICE PROGRESS NOTE   Diagnosis: Metastatic adenocarcinoma  INTERVAL HISTORY:   Ms. Runkel was discharged 10/19/2017 after an admission with GI bleeding from a duodenal ulcer.  She underwent an upper endoscopy confirming a duodenal ulcer, a biopsy did not reveal malignancy.  No bleeding.  She has mild discomfort at the back and upper abdomen.  She continues to have pruritus.   Objective:  Vital signs in last 24 hours:  Blood pressure 126/62, pulse 70, temperature 98.5 F (36.9 C), temperature source Oral, resp. rate 18, height 5\' 6"  (1.676 m), weight 190 lb (86.2 kg), SpO2 100 %.    HEENT: Scleral and subungual icterus Lymphatics: No cervical or supraclavicular nodes Resp: Lungs clear bilaterally Cardio: Regular rate and rhythm GI: No hepatomegaly, no mass Vascular: No leg edema    Lab Results:  Lab Results  Component Value Date   WBC 11.8 (H) 10/20/2017   HGB 9.6 (L) 10/20/2017   HCT 30.0 (L) 10/20/2017   MCV 92.3 10/20/2017   PLT 377 10/20/2017   NEUTROABS 8.6 (H) 10/17/2017    CMP     Component Value Date/Time   NA 133 (L) 10/20/2017 0636   K 5.0 10/20/2017 0636   CL 103 10/20/2017 0636   CO2 23 10/20/2017 0636   GLUCOSE 225 (H) 10/20/2017 0636   BUN 24 (H) 10/20/2017 0636   CREATININE 1.07 (H) 10/20/2017 0636   CALCIUM 9.3 10/20/2017 0636   PROT 7.3 10/20/2017 0636   ALBUMIN 2.3 (L) 10/20/2017 0636   AST 164 (H) 10/20/2017 0636   ALT 185 (H) 10/20/2017 0636   ALKPHOS 1,193 (H) 10/20/2017 0636   BILITOT 11.4 (H) 10/20/2017 0636   GFRNONAA 54 (L) 10/20/2017 0636   GFRAA >60 10/20/2017 0636    Lab Results  Component Value Date   CEA1 8.4 (H) 10/18/2017    Lab Results  Component Value Date   INR 1.42 10/17/2017    Imaging:  No results found.  Medications: I have reviewed the patient's current medications.   Assessment/Plan:  1.  Metastatic adenocarcinoma, biopsy of a segment 5 liver mass  09/21/2017-adenocarcinoma  CT abdomen/pelvis 08/17/2017-gallbladder fossa mass, upper abdominal lymphadenopathy, liver metastases  MRI of the liver 10/14/2017-masslike appearance of the gallbladder with direct invasion of the adjacent liver, multiple hepatic metastases, upper abdominal nodal metastases  2.  Severe anemia secondary to GI bleeding  Duodenal bulb ulcer noted on endoscopy 10/18/2017, biopsied- inflammation/necrosis consistent with ulceration, focal atypical epithelioid cells of unknown significance  3.  Anorexia/weight loss  4.  Diabetes  5.  Uterine fibroids  6.  Markedly elevated liver enzymes and bilirubin-potentially related to extrahepatic obstruction versus intrahepatic cholestasis from tumor     Disposition: Ms. Degrasse has been diagnosed with metastatic adenocarcinoma involving the liver.  The clinical presentation is most consistent with primary gallbladder cancer versus cholangiocarcinoma.  I discussed the diagnosis and treatment options with Ms. Liane Comber and her family.  She is jaundiced today.  The liver enzymes and bilirubin were significantly elevated when she was in the hospital last week. I reviewed the MRI from 10/14/2017 with a radiologist.  He indicates there is significant intrahepatic bile duct dilatation, likely from obstructing tumor at the porta hepatis.  Ms. Liane Comber has a history of chronic renal insufficiency, but the creatinine was improved when last checked.  We will check a chemistry panel today.  We will arrange for a CT abdomen/pelvis with low-dose contrast to look for evidence of bile duct obstruction and  to serve as a baseline CT.  I recommend FOLFOX chemotherapy.  We reviewed the potential toxicities associated with the FOLFOX regimen including the chance for nausea/vomiting, mucositis, diarrhea, alopecia, and hematologic toxicity.  We discussed the sun sensitivity, hyperpigmentation, and hand/foot syndrome associated with 5-fluorouracil.  We  reviewed the allergic reaction and various types of neuropathy seen with oxaliplatin.  She agrees to proceed.  She will be referred for Port-A-Cath placement with the plan to begin FOLFOX chemotherapy 11/01/2017.  I will discussed the case with Dr. Therisa Doyne regarding the potential for relief of hyperbilirubinemia with placement of a bile duct stent.  40 minutes were spent with the patient today.  The majority of the time was used for counseling and coordination of care.  Betsy Coder, MD  10/22/2017  2:25 PM

## 2017-10-22 NOTE — Progress Notes (Signed)
  Oncology Nurse Navigator Documentation  Navigator Location: [facility] (10/22/17 1522)   )                      Patient Visit Type: MedOnc;Initial (10/22/17 1522)  Met with patient and extended family during initial med/onc appointment. No barriers to treatment identified. Nutrition referral placed. Barriers/Navigation Needs: No barriers at this time (10/22/17 1522)   Interventions: Referrals (10/22/17 1522) Referrals: Nutrition/dietician (10/22/17 1522)          Acuity: Level 2 (10/22/17 1522)         Time Spent with Patient: 45 (10/22/17 1522)

## 2017-10-22 NOTE — Progress Notes (Signed)
START OFF PATHWAY REGIMEN - [Other Dx]   [id number]:FOLFOX (q14d):   A cycle is every 14 days:     Oxaliplatin      Leucovorin      5-Fluorouracil      5-Fluorouracil   **Always confirm dose/schedule in your pharmacy ordering system**  Patient Characteristics: Intent of Therapy: Non-Curative / Palliative Intent, Discussed with Patient 

## 2017-10-23 ENCOUNTER — Inpatient Hospital Stay: Payer: BLUE CROSS/BLUE SHIELD | Admitting: Nutrition

## 2017-10-23 ENCOUNTER — Telehealth: Payer: Self-pay

## 2017-10-23 LAB — URINE CULTURE: Culture: >=100000 — AB

## 2017-10-23 NOTE — Telephone Encounter (Signed)
Received call from Radiology scheduling regarding questions from pt daughter on contrast for scan. Pt daughter questions whether she can receive IV contrast due to her lab levels. Spoke with Megan, CT Tech, to inform that pt Cr, Bilirubin, BUN are elevated and she informed that they go by the GFR. Pt is eligible for a reduced dose of IV contrast. Pt will also still drink the oral contrast per Megan. Relayed info to pt daughter [name] and she voiced understanding and gratitude. Will call with any further questions/concerns.

## 2017-10-23 NOTE — Progress Notes (Deleted)
  Oncology Nurse Navigator Documentation  Navigator Location: [facility] (10/23/17 1055)   )                    Treatment Initiated Date: 10/23/17 (10/23/17 1055)  Met with patient during 1st chemo treatment to offer emotional support and to answer questions. No barriers or needs.                    Acuity: Level 1 (10/23/17 1055)         Time Spent with Patient: 15 (10/23/17 1055)

## 2017-10-23 NOTE — Progress Notes (Signed)
62 year old female diagnosed with metastatic adenocarcinoma involving liver.  Past medical history includes hypertension, CAD, diabetes, chronic renal insufficiency, and hyperlipidemia  Medications include NovoLog, Lantus, Protonix, and MiraLAX.  Labs include sodium 126, potassium 5.2, glucose 345, BUN 35, creatinine 1.63, albumin 1.9, and bilirubin 11.1.  Height: 66 inches. Weight: 190 pounds on February 11. Usual body weight: 206 pounds Jan 11. BMI:30.67.  Patient reports she has a poor appetite and early satiety. States she was recently hospitalized for dehydration. Reports 64 oz of water intake. States she ate 1/2 sausage biscuit and chicken noodle soup yesterday. Wants to eat well to build up her immune system and get ready for chemotherapy.  Nutrition diagnosis: Unintended weight loss related to inadequate oral intake as evidenced by approximately 15 pound weight loss over 1 month  Intervention: Educated patient to consume small frequent meals and snacks consisting of high-calorie high-protein foods to support weight maintenance Encouraged patient continue with adequate fluids Reviewed additional strategies for poor appetite Provided fact sheets.  Provided patient with samples of oral nutrition supplements and gave her recipes. Questions were answered teach back method used.  Contact information provided  Monitoring evaluation goals: Patient will tolerate adequate calories and protein to minimize weight loss.  Next visit: Thursday, February 21 during infusion.  **Disclaimer: This note was dictated with voice recognition software. Similar sounding words can inadvertently be transcribed and this note may contain transcription errors which may not have been corrected upon publication of note.**

## 2017-10-24 ENCOUNTER — Other Ambulatory Visit: Payer: Self-pay | Admitting: Gastroenterology

## 2017-10-24 ENCOUNTER — Encounter (HOSPITAL_COMMUNITY): Payer: Self-pay | Admitting: *Deleted

## 2017-10-24 ENCOUNTER — Encounter: Payer: Self-pay | Admitting: *Deleted

## 2017-10-24 ENCOUNTER — Other Ambulatory Visit: Payer: Self-pay

## 2017-10-25 ENCOUNTER — Inpatient Hospital Stay: Payer: BLUE CROSS/BLUE SHIELD

## 2017-10-25 ENCOUNTER — Other Ambulatory Visit: Payer: Self-pay

## 2017-10-25 ENCOUNTER — Encounter: Payer: Self-pay | Admitting: *Deleted

## 2017-10-25 DIAGNOSIS — C23 Malignant neoplasm of gallbladder: Secondary | ICD-10-CM

## 2017-10-25 MED ORDER — PROCHLORPERAZINE MALEATE 10 MG PO TABS
10.0000 mg | ORAL_TABLET | Freq: Four times a day (QID) | ORAL | 0 refills | Status: AC | PRN
Start: 2017-10-25 — End: ?

## 2017-10-25 MED ORDER — LIDOCAINE-PRILOCAINE 2.5-2.5 % EX CREA: TOPICAL_CREAM | CUTANEOUS | 0 refills | Status: DC

## 2017-10-26 ENCOUNTER — Other Ambulatory Visit: Payer: Self-pay | Admitting: Radiology

## 2017-10-26 ENCOUNTER — Telehealth: Payer: Self-pay

## 2017-10-26 ENCOUNTER — Other Ambulatory Visit: Payer: Self-pay | Admitting: General Surgery

## 2017-10-26 ENCOUNTER — Ambulatory Visit (HOSPITAL_COMMUNITY)
Admission: RE | Admit: 2017-10-26 | Discharge: 2017-10-26 | Disposition: A | Discharge status: Home / Self Care | Payer: BLUE CROSS/BLUE SHIELD | Source: Ambulatory Visit | Attending: Oncology | Admitting: Oncology

## 2017-10-26 DIAGNOSIS — D259 Leiomyoma of uterus, unspecified: Secondary | ICD-10-CM | POA: Diagnosis not present

## 2017-10-26 DIAGNOSIS — I714 Abdominal aortic aneurysm, without rupture: Secondary | ICD-10-CM | POA: Diagnosis not present

## 2017-10-26 DIAGNOSIS — C787 Secondary malignant neoplasm of liver and intrahepatic bile duct: Secondary | ICD-10-CM | POA: Diagnosis not present

## 2017-10-26 DIAGNOSIS — I700 Atherosclerosis of aorta: Secondary | ICD-10-CM | POA: Diagnosis not present

## 2017-10-26 DIAGNOSIS — C23 Malignant neoplasm of gallbladder: Secondary | ICD-10-CM

## 2017-10-26 DIAGNOSIS — I2510 Atherosclerotic heart disease of native coronary artery without angina pectoris: Secondary | ICD-10-CM | POA: Diagnosis not present

## 2017-10-26 DIAGNOSIS — J439 Emphysema, unspecified: Secondary | ICD-10-CM | POA: Insufficient documentation

## 2017-10-26 MED ORDER — IOPAMIDOL (ISOVUE-300) INJECTION 61%
75.0000 mL | Freq: Once | INTRAVENOUS | Status: AC | PRN
  Administered 2017-10-26: 75 mL via INTRAVENOUS

## 2017-10-26 MED ORDER — IOPAMIDOL (ISOVUE-300) INJECTION 61%
INTRAVENOUS | Status: AC
  Filled 2017-10-26: qty 75

## 2017-10-26 NOTE — Telephone Encounter (Signed)
Spoke with nurses Dia and Raford Pitcher at [facility] to inform them that I have faxed CT report from Dr. Wynema Birch to their office for Dr. Therisa Doyne and Dr. Watt Climes. Both nurses voiced understanding.

## 2017-10-28 ENCOUNTER — Other Ambulatory Visit: Payer: Self-pay | Admitting: Oncology

## 2017-10-29 ENCOUNTER — Encounter (HOSPITAL_COMMUNITY): Payer: Self-pay

## 2017-10-29 ENCOUNTER — Other Ambulatory Visit: Payer: Self-pay | Admitting: Gastroenterology

## 2017-10-29 ENCOUNTER — Ambulatory Visit (HOSPITAL_COMMUNITY)
Admission: RE | Admit: 2017-10-29 | Discharge: 2017-10-29 | Disposition: A | Discharge status: Home / Self Care | Payer: BLUE CROSS/BLUE SHIELD | Source: Ambulatory Visit | Attending: Oncology | Admitting: Oncology

## 2017-10-29 ENCOUNTER — Other Ambulatory Visit: Payer: Self-pay | Admitting: Oncology

## 2017-10-29 DIAGNOSIS — Z79899 Other long term (current) drug therapy: Secondary | ICD-10-CM | POA: Insufficient documentation

## 2017-10-29 DIAGNOSIS — I700 Atherosclerosis of aorta: Secondary | ICD-10-CM | POA: Diagnosis not present

## 2017-10-29 DIAGNOSIS — C787 Secondary malignant neoplasm of liver and intrahepatic bile duct: Secondary | ICD-10-CM | POA: Insufficient documentation

## 2017-10-29 DIAGNOSIS — J439 Emphysema, unspecified: Secondary | ICD-10-CM | POA: Insufficient documentation

## 2017-10-29 DIAGNOSIS — Z794 Long term (current) use of insulin: Secondary | ICD-10-CM | POA: Diagnosis not present

## 2017-10-29 DIAGNOSIS — C23 Malignant neoplasm of gallbladder: Secondary | ICD-10-CM | POA: Diagnosis not present

## 2017-10-29 DIAGNOSIS — I2510 Atherosclerotic heart disease of native coronary artery without angina pectoris: Secondary | ICD-10-CM | POA: Insufficient documentation

## 2017-10-29 DIAGNOSIS — E119 Type 2 diabetes mellitus without complications: Secondary | ICD-10-CM | POA: Insufficient documentation

## 2017-10-29 DIAGNOSIS — I10 Essential (primary) hypertension: Secondary | ICD-10-CM | POA: Diagnosis not present

## 2017-10-29 HISTORY — PX: IR FLUORO GUIDE PORT INSERTION RIGHT: IMG5741

## 2017-10-29 HISTORY — PX: IR US GUIDE VASC ACCESS RIGHT: IMG2390

## 2017-10-29 LAB — CBC
HEMATOCRIT: 26.3 % — AB (ref 36.0–46.0)
HEMOGLOBIN: 8.8 g/dL — AB (ref 12.0–15.0)
MCH: 29.8 pg (ref 26.0–34.0)
MCHC: 33.5 g/dL (ref 30.0–36.0)
MCV: 89.2 fL (ref 78.0–100.0)
Platelets: 419 10*3/uL — ABNORMAL HIGH (ref 150–400)
RBC: 2.95 MIL/uL — AB (ref 3.87–5.11)
RDW: 18 % — ABNORMAL HIGH (ref 11.5–15.5)
WBC: 13 10*3/uL — AB (ref 4.0–10.5)

## 2017-10-29 LAB — PROTIME-INR
INR: 1.37
Prothrombin Time: 16.7 seconds — ABNORMAL HIGH (ref 11.4–15.2)

## 2017-10-29 LAB — GLUCOSE, CAPILLARY: Glucose-Capillary: 184 mg/dL — ABNORMAL HIGH (ref 65–99)

## 2017-10-29 MED ORDER — HEPARIN SOD (PORK) LOCK FLUSH 100 UNIT/ML IV SOLN
INTRAVENOUS | Status: AC
  Filled 2017-10-29: qty 5

## 2017-10-29 MED ORDER — MIDAZOLAM HCL 2 MG/2ML IJ SOLN
INTRAMUSCULAR | Status: AC | PRN
  Administered 2017-10-29: 1 mg via INTRAVENOUS
  Administered 2017-10-29: 0.5 mg via INTRAVENOUS

## 2017-10-29 MED ORDER — IOPAMIDOL (ISOVUE-300) INJECTION 61%
INTRAVENOUS | Status: AC
  Filled 2017-10-29: qty 50

## 2017-10-29 MED ORDER — CEFAZOLIN SODIUM-DEXTROSE 2-4 GM/100ML-% IV SOLN
INTRAVENOUS | Status: AC
  Administered 2017-10-29: 2 g via INTRAVENOUS
  Filled 2017-10-29: qty 100

## 2017-10-29 MED ORDER — IOPAMIDOL (ISOVUE-300) INJECTION 61%
50.0000 mL | Freq: Once | INTRAVENOUS | Status: AC | PRN
  Administered 2017-10-29: 10 mL via INTRAVENOUS

## 2017-10-29 MED ORDER — FENTANYL CITRATE (PF) 100 MCG/2ML IJ SOLN
INTRAMUSCULAR | Status: AC | PRN
  Administered 2017-10-29: 50 ug via INTRAVENOUS
  Administered 2017-10-29: 25 ug via INTRAVENOUS

## 2017-10-29 MED ORDER — SODIUM CHLORIDE 0.9 % IV SOLN
INTRAVENOUS | Status: DC
  Administered 2017-10-29: 10:00:00 via INTRAVENOUS

## 2017-10-29 MED ORDER — FENTANYL CITRATE (PF) 100 MCG/2ML IJ SOLN
INTRAMUSCULAR | Status: AC
  Filled 2017-10-29: qty 4

## 2017-10-29 MED ORDER — LIDOCAINE-EPINEPHRINE (PF) 2 %-1:200000 IJ SOLN
INTRAMUSCULAR | Status: AC
  Filled 2017-10-29: qty 20

## 2017-10-29 MED ORDER — HEPARIN SOD (PORK) LOCK FLUSH 100 UNIT/ML IV SOLN
INTRAVENOUS | Status: AC | PRN
  Administered 2017-10-29: 500 [IU] via INTRAVENOUS

## 2017-10-29 MED ORDER — MIDAZOLAM HCL 2 MG/2ML IJ SOLN
INTRAMUSCULAR | Status: AC
  Filled 2017-10-29: qty 4

## 2017-10-29 MED ORDER — CEFAZOLIN SODIUM-DEXTROSE 2-4 GM/100ML-% IV SOLN
2.0000 g | INTRAVENOUS | Status: AC
  Administered 2017-10-29: 2 g via INTRAVENOUS

## 2017-10-29 MED ORDER — LIDOCAINE HCL (PF) 1 % IJ SOLN
INTRAMUSCULAR | Status: AC
  Filled 2017-10-29: qty 30

## 2017-10-29 NOTE — Discharge Instructions (Signed)
Moderate Conscious Sedation, Adult, Care After °These instructions provide you with information about caring for yourself after your procedure. Your health care provider may also give you more specific instructions. Your treatment has been planned according to current medical practices, but problems sometimes occur. Call your health care provider if you have any problems or questions after your procedure. °What can I expect after the procedure? °After your procedure, it is common: °· To feel sleepy for several hours. °· To feel clumsy and have poor balance for several hours. °· To have poor judgment for several hours. °· To vomit if you eat too soon. ° °Follow these instructions at home: °For at least 24 hours after the procedure: ° °· Do not: °? Participate in activities where you could fall or become injured. °? Drive. °? Use heavy machinery. °? Drink alcohol. °? Take sleeping pills or medicines that cause drowsiness. °? Make important decisions or sign legal documents. °? Take care of children on your own. °· Rest. °Eating and drinking °· Follow the diet recommended by your health care provider. °· If you vomit: °? Drink water, juice, or soup when you can drink without vomiting. °? Make sure you have little or no nausea before eating solid foods. °General instructions °· Have a responsible adult stay with you until you are awake and alert. °· Take over-the-counter and prescription medicines only as told by your health care provider. °· If you smoke, do not smoke without supervision. °· Keep all follow-up visits as told by your health care provider. This is important. °Contact a health care provider if: °· You keep feeling nauseous or you keep vomiting. °· You feel light-headed. °· You develop a rash. °· You have a fever. °Get help right away if: °· You have trouble breathing. °This information is not intended to replace advice given to you by your health care provider. Make sure you discuss any questions you have  with your health care provider. °Document Released: 06/18/2013 Document Revised: 01/31/2016 Document Reviewed: 12/18/2015 °Elsevier Interactive Patient Education © 2018 [employer] ° ° °Implanted Port Insertion, Care After °This sheet gives you information about how to care for yourself after your procedure. Your health care provider may also give you more specific instructions. If you have problems or questions, contact your health care provider. °What can I expect after the procedure? °After your procedure, it is common to have: °· Discomfort at the port insertion site. °· Bruising on the skin over the port. This should improve over 3-4 days. ° °Follow these instructions at home: °Port care °· After your port is placed, you will get a manufacturer's information card. The card has information about your port. Keep this card with you at all times. °· Take care of the port as told by your health care provider. Ask your health care provider if you or a family member can get training for taking care of the port at home. A home health care nurse may also take care of the port. °· Make sure to remember what type of port you have. °Incision care °· Follow instructions from your health care provider about how to take care of your port insertion site. Make sure you: °? Wash your hands with soap and water before you change your bandage (dressing). If soap and water are not available, use hand sanitizer. °? Change your dressing as told by your health care provider.  You may remove your dressing tomorrow. °? Leave skin glue in place. These skin closures   may need to stay in place for 2 weeks or longer. If adhesive strip edges start to loosen and curl up, you may trim the loose edges. Do not remove adhesive strips completely unless your health care provider tells you to do that.  DO NOT use EMLA cream for 2 weeks after port placement as this cream will remove surgical glue on your incision. °· Check your port insertion site  every day for signs of infection. Check for: °? More redness, swelling, or pain. °? More fluid or blood. °? Warmth. °? Pus or a bad smell. °General instructions °· Do not take baths, swim, or use a hot tub until your health care provider approves.  You may shower tomorrow. °· Do not lift anything that is heavier than 10 lb (4.5 kg) for a week, or as told by your health care provider. °· Ask your health care provider when it is okay to: °? Return to work or school. °? Resume usual physical activities or sports. °· Do not drive for 24 hours if you were given a medicine to help you relax (sedative). °· Take over-the-counter and prescription medicines only as told by your health care provider. °· Wear a medical alert bracelet in case of an emergency. This will tell any health care providers that you have a port. °· Keep all follow-up visits as told by your health care provider. This is important. °Contact a health care provider if: °· You have a fever or chills. °· You have more redness, swelling, or pain around your port insertion site. °· You have more fluid or blood coming from your port insertion site. °· Your port insertion site feels warm to the touch. °· You have pus or a bad smell coming from the port insertion site. °Get help right away if: °· You have chest pain or shortness of breath. °· You have bleeding from your port that you cannot control. °Summary °· Take care of the port as told by your health care provider. °· Change your dressing as told by your health care provider. °· Keep all follow-up visits as told by your health care provider. °This information is not intended to replace advice given to you by your health care provider. Make sure you discuss any questions you have with your health care provider. °Document Released: 06/18/2013 Document Revised: 07/19/2016 Document Reviewed: 07/19/2016 °Elsevier Interactive Patient Education © 2017 [employer]. ° °

## 2017-10-29 NOTE — Procedures (Signed)
Interventional Radiology Procedure Note  Procedure: Placement of a right IJ approach single lumen PowerPort.  Tip is positioned at the superior cavoatrial junction and catheter is ready for immediate use.  Complications: No immediate Recommendations:  - Ok to shower tomorrow - Do not submerge for 7 days - Routine line care   Signed,  Heath K. McCullough, MD   

## 2017-10-29 NOTE — Sedation Documentation (Signed)
Patient is resting comfortably with eyes closed in NAD. No s/s of pain/discomfort noted 

## 2017-10-29 NOTE — Sedation Documentation (Signed)
MD made aware of Cr of 1.63

## 2017-10-29 NOTE — Sedation Documentation (Signed)
Patient is resting comfortably with eyes closed snoring occasionally. No s/s of pain/discomfort. MD suturing port pocket at this time

## 2017-10-29 NOTE — H&P (Signed)
Referring Physician(s): Ladell Pier  Supervising Physician: Jacqulynn Cadet  Patient Status:  [facility]  Chief Complaint:  "I'm getting a port a cath"  Subjective: Patient familiar to IR service from recent liver lesion biopsy on 09/21/17.  She has a history of recently diagnosed metastatic adenocarcinoma to the liver(? primary gallbladder CA versus cholangiocarcinoma) and presents again today for Port-A-Cath placement for chemotherapy.  She currently denies fever, headache, chest pain, dyspnea, cough, nausea, vomiting or bleeding.  She is jaundiced and does have intermittent abdominal and back discomfort. Past Medical History:  Diagnosis Date  . Coronary artery disease    has diffuse distal vessel disease. Managed medically  . Diabetes mellitus   . Duodenal ulcer: Per EGD 10/18/2017 10/18/2017  . Esophageal ulceration: EGD 10/18/2017 10/18/2017  . Fibroid tumor   . Hyperlipidemia   . Hypertension   . Liver cancer ([place])   . Type 2 diabetes mellitus without complication ([place]) 02/11/3353   Past Surgical History:  Procedure Laterality Date  . CARDIAC CATHETERIZATION  04/05/2007   EF 75%.MODERATE 3 VESSEL CAD  . CARDIOVASCULAR STRESS TEST  02/28/2007   EF 53%. NO ISCHEMIA  . COLONOSCOPY WITH PROPOFOL N/A 05/31/2015   Procedure: COLONOSCOPY WITH PROPOFOL;  Surgeon: Garlan Fair, MD;  Location: [facility];  Service: Endoscopy;  Laterality: N/A;  . ESOPHAGOGASTRODUODENOSCOPY (EGD) WITH PROPOFOL Left 10/18/2017   Procedure: ESOPHAGOGASTRODUODENOSCOPY (EGD) WITH PROPOFOL;  Surgeon: Ronnette Juniper, MD;  Location: [facility];  Service: Gastroenterology;  Laterality: Left;  . MOUTH SURGERY      Allergies: Patient has no known allergies.  Medications: Prior to Admission medications   Medication Sig Start Date End Date Taking? Authorizing Provider  amLODipine (NORVASC) 10 MG tablet Take 10 mg by mouth every morning.   Yes [provider]  carvedilol (COREG) 25 MG tablet TAKE ONE  TABLET BY MOUTH TWICE DAILY WITH MEALS Patient taking differently: TAKE 25 MG BY MOUTH TWICE DAILY WITH MEALS 05/23/12  Yes Burtis Junes, NP  insulin aspart (NOVOLOG FLEXPEN) 100 UNIT/ML FlexPen Inject 2-10 Units into the skin 2 (two) times daily. Per sliding scale   Yes [provider]  insulin glargine (LANTUS) 100 UNIT/ML injection Inject 25 Units into the skin 2 (two) times daily.    Yes [provider]  isosorbide mononitrate (IMDUR) 60 MG 24 hr tablet Take 60 mg by mouth every morning.    Yes [provider]  latanoprost (XALATAN) 0.005 % ophthalmic solution Place 1 drop into both eyes at bedtime.    Yes [provider]  ondansetron (ZOFRAN-ODT) 4 MG disintegrating tablet Take 4 mg by mouth every 6 (six) hours as needed for nausea or vomiting. 10/16/17  Yes [provider]  pantoprazole (PROTONIX) 40 MG tablet Take 1 tablet (40 mg total) by mouth 2 (two) times daily before a meal. 10/20/17  Yes Eugenie Filler, MD  polyethylene glycol (MIRALAX / GLYCOLAX) packet Take 17 g by mouth daily. 10/21/17  Yes Eugenie Filler, MD  traMADol (ULTRAM) 50 MG tablet Take 1 tablet (50 mg total) by mouth every 12 (twelve) hours as needed for moderate pain or severe pain. 10/20/17  Yes Eugenie Filler, MD  ursodiol (ACTIGALL) 300 MG capsule Take 1 capsule (300 mg total) by mouth 3 (three) times daily. 10/20/17  Yes Eugenie Filler, MD  acetaminophen (TYLENOL) 500 MG tablet Take 1 tablet (500 mg total) by mouth every 6 (six) hours as needed for mild pain or moderate pain.  Patient not taking: Reported on 10/24/2017 10/20/17   Eugenie Filler, MD  lidocaine-prilocaine (EMLA) cream Apply 1hr prior to port use. Do not rub in. Cover with plastic wrap. 10/25/17   Ladell Pier, MD  prochlorperazine (COMPAZINE) 10 MG tablet Take 1 tablet (10 mg total) by mouth every 6 (six) hours as needed for nausea or vomiting. 10/25/17   Ladell Pier, MD  protein supplement  (UNJURY CHICKEN SOUP) POWD Take 27 g (8 oz total) by mouth 2 (two) times daily. Patient not taking: Reported on 10/24/2017 10/20/17   Eugenie Filler, MD     Vital Signs: Blood pressure 109/60, heart rate 63, temp 98.6, respirations 16, O2 sat 100% room air    Physical Exam awake, alert.  Scleral icterus noted.  Chest clear to auscultation bilaterally.  Heart with regular rate and rhythm.  Abdomen soft, positive bowel sounds, mild generalized tenderness to palpation.  No lower extremity edema.  Imaging: Ct Abdomen Pelvis W Contrast  Result Date: 10/26/2017 CLINICAL DATA:  Metastatic adenocarcinoma. Gallbladder cancer. Evaluate for bile duct obstruction. EXAM: CT ABDOMEN AND PELVIS WITH CONTRAST TECHNIQUE: Multidetector CT imaging of the abdomen and pelvis was performed using the standard protocol following bolus administration of intravenous contrast. CONTRAST:  18mL ISOVUE-300 IOPAMIDOL (ISOVUE-300) INJECTION 61% COMPARISON:  10/14/2017 and 08/17/2017 FINDINGS: Lower chest: No acute abnormality. Aortic atherosclerosis identified. Atherosclerotic calcifications within the RCA, LAD and left circumflex coronary arteries noted. Hepatobiliary: Multiple liver masses identified. The dominant mass involving segment 5, presumably arising from the gallbladder, measures 8.4 cm, image 44 of series 2. Unchanged from previous exam. The other lesions also similar. Moderate to marked intrahepatic bile duct dilatation involves both lobes of liver. Obstruction may be due to bulky porta hepatic adenopathy and/or gallbladder mass. The common bile duct is not visualized. Pancreas: Unremarkable. No pancreatic ductal dilatation or surrounding inflammatory changes. Spleen: Normal in size without focal abnormality. Adrenals/Urinary Tract: Normal appearance of the adrenal glands. Asymmetric atrophy of the right kidney. There is extensive scarring involving the right kidney. Mild scarring but no significant volume loss from the  left kidney. Large stones identified within the right renal pelvis and lower pole collecting system of the left kidney. The largest measures 1.7 cm. Urinary bladder negative. Stomach/Bowel: The stomach is normal. Bulky porta hepatic adenopathy has mass effect and may involve the descending portion of the duodenum. No gastric outlet obstruction identified at this time. Small bowel loops have a normal course and caliber. The appendix is visualized and appears normal. No pathologic dilatation of the colon. Vascular/Lymphatic: Aortic atherosclerosis. Infrarenal abdominal aortic aneurysm measures 3.8 cm. Tumor and adenopathy exhibits mass effect upon the portal vein. The branch to the left lobe is markedly diminished in caliber, image 27 of series 2. The main portal vein is also significantly diminished due to mass effect, image 29 of series 2. Bulky periaortic adenopathy identified. Index node has a short axis of 3.1 cm, image 27 of series 7. Peripancreatic node measures 2.8 cm, image 34 of series 2. No enlarged pelvic or inguinal lymph nodes. Reproductive: Large partially calcified fibroid uterus is identified. This measures 14.6 by 15.1 by 15.6 cm (volume = 1800 cm^3). No adnexal mass visualized. Other: No ascites or focal fluid collections identified. There is a fat containing umbilical hernia measuring 3.4 cm. Musculoskeletal: There is no aggressive lytic or sclerotic bone lesions identified. IMPRESSION: 1. Again noted are multifocal liver metastasis. Large mass centered around the gallbladder and involving segment 5 of the  liver may represent primary gallbladder cancer. 2. Moderate to marked intrahepatic biliary dilatation likely reflecting mass-effect upon the central bile ducts and common bile duct by gallbladder mass and bulky porta hepatic adenopathy. New from 08/17/2017. 3. Tumor exhibit significant mass effect and narrows portions of the portal vein without evidence for thrombosis. 4. Upper abdominal  adenopathy as before. 5. Large partially calcified fibroid uterus with a volume of approximately 1800 cc. 6. Aortic atherosclerosis, infrarenal abdominal aortic aneurysm, and 3 vessel coronary artery atherosclerotic calcifications. 7. Aortic aneurysm NOS (ICD10-I71.9). Recommend followup by ultrasound in 2 years. This recommendation follows ACR consensus guidelines: White Paper of the ACR Incidental Findings Committee II on Vascular Findings. J Am Coll Radiol 2013; 10:789-794. 8. Aortic Atherosclerosis (ICD10-I70.0) and Emphysema (ICD10-J43.9). Electronically Signed   By: Kerby Moors M.D.   On: 10/26/2017 09:29    Labs:  CBC: Recent Labs    10/17/17 1133 10/18/17 0636 10/19/17 0644 10/20/17 0636 10/22/17 1535  WBC 11.1* 12.5* 11.4* 11.8* 11.8*  HGB 7.4* 9.9* 9.8* 9.6*  --   HCT 24.2* 30.7* 31.3* 30.0* 29.6*  PLT 398 411* 397 377 346    COAGS: Recent Labs    09/21/17 1104 10/17/17 1334  INR 1.12 1.42  APTT 34 35    BMP: Recent Labs    10/18/17 0636 10/19/17 0644 10/20/17 0636 10/22/17 1535  NA 135 134* 133* 126*  K 4.6 4.5 5.0 5.2*  CL 110 107 103 96*  CO2 18* 20* 23 20*  GLUCOSE 170* 203* 225* 345*  BUN 63* 33* 24* 35*  CALCIUM 9.5 9.3 9.3 9.8  CREATININE 1.42* 1.16* 1.07* 1.63*  GFRNONAA 38* 49* 54* 33*  GFRAA 45* 57* >60 38*    LIVER FUNCTION TESTS: Recent Labs    10/18/17 0636 10/19/17 0644 10/20/17 0636 10/22/17 1535  BILITOT 6.2* 9.4* 11.4* 11.1*  AST 208* 278* 164* 93*  ALT 170* 201* 185* 115*  ALKPHOS 1,095* 1,268* 1,193* 1,161*  PROT 7.4 7.1 7.3 7.0  ALBUMIN 2.3* 2.2* 2.3* 1.9*    Assessment and Plan:  Pt with history of recently diagnosed metastatic adenocarcinoma to the liver(? primary gallbladder CA versus cholangiocarcinoma); presents today for Port-A-Cath placement for chemotherapy.  Risks and benefits discussed with the patient including, but not limited to bleeding, infection, pneumothorax, or fibrin sheath development and need for  additional procedures.  All of the patient's questions were answered, patient is agreeable to proceed. Consent signed and in chart.  Labs pend   Electronically Signed: D. Rowe Robert, PA-C 10/29/2017, 10:08 AM   I spent a total of 25 minutes  at the the patient's bedside AND on the patient's hospital floor or unit, greater than 50% of which was counseling/coordinating care for Port-A-Cath placement

## 2017-10-30 ENCOUNTER — Ambulatory Visit (HOSPITAL_COMMUNITY): Payer: BLUE CROSS/BLUE SHIELD | Admitting: Anesthesiology

## 2017-10-30 ENCOUNTER — Other Ambulatory Visit: Payer: Self-pay

## 2017-10-30 ENCOUNTER — Ambulatory Visit (HOSPITAL_COMMUNITY)
Admission: RE | Admit: 2017-10-30 | Discharge: 2017-10-30 | Disposition: A | Discharge status: Home / Self Care | Payer: BLUE CROSS/BLUE SHIELD | Source: Ambulatory Visit | Attending: Gastroenterology | Admitting: Gastroenterology

## 2017-10-30 ENCOUNTER — Encounter (HOSPITAL_COMMUNITY)
Admission: RE | Disposition: A | Discharge status: Home / Self Care | Payer: Self-pay | Source: Ambulatory Visit | Attending: Gastroenterology

## 2017-10-30 ENCOUNTER — Encounter (HOSPITAL_COMMUNITY): Payer: Self-pay | Admitting: *Deleted

## 2017-10-30 DIAGNOSIS — K831 Obstruction of bile duct: Secondary | ICD-10-CM | POA: Insufficient documentation

## 2017-10-30 DIAGNOSIS — E119 Type 2 diabetes mellitus without complications: Secondary | ICD-10-CM | POA: Insufficient documentation

## 2017-10-30 DIAGNOSIS — C23 Malignant neoplasm of gallbladder: Secondary | ICD-10-CM | POA: Diagnosis not present

## 2017-10-30 DIAGNOSIS — Z87891 Personal history of nicotine dependence: Secondary | ICD-10-CM | POA: Diagnosis not present

## 2017-10-30 DIAGNOSIS — Z794 Long term (current) use of insulin: Secondary | ICD-10-CM | POA: Insufficient documentation

## 2017-10-30 DIAGNOSIS — I10 Essential (primary) hypertension: Secondary | ICD-10-CM | POA: Diagnosis not present

## 2017-10-30 DIAGNOSIS — I2510 Atherosclerotic heart disease of native coronary artery without angina pectoris: Secondary | ICD-10-CM | POA: Diagnosis not present

## 2017-10-30 HISTORY — PX: ERCP: SHX5425

## 2017-10-30 LAB — GLUCOSE, CAPILLARY: GLUCOSE-CAPILLARY: 228 mg/dL — AB (ref 65–99)

## 2017-10-30 SURGERY — ERCP, WITH INTERVENTION IF INDICATED
Anesthesia: General

## 2017-10-30 MED ORDER — INDOMETHACIN 50 MG RE SUPP
RECTAL | Status: DC | PRN
  Administered 2017-10-30 (×2): 50 mg via RECTAL

## 2017-10-30 MED ORDER — FENTANYL CITRATE (PF) 100 MCG/2ML IJ SOLN
INTRAMUSCULAR | Status: DC | PRN
  Administered 2017-10-30: 50 ug via INTRAVENOUS

## 2017-10-30 MED ORDER — FENTANYL CITRATE (PF) 100 MCG/2ML IJ SOLN
INTRAMUSCULAR | Status: AC
  Filled 2017-10-30: qty 2

## 2017-10-30 MED ORDER — CIPROFLOXACIN IN D5W 400 MG/200ML IV SOLN
INTRAVENOUS | Status: AC
Start: 2017-10-30 — End: ?
  Filled 2017-10-30: qty 200

## 2017-10-30 MED ORDER — SUGAMMADEX SODIUM 200 MG/2ML IV SOLN
INTRAVENOUS | Status: DC | PRN
  Administered 2017-10-30: 200 mg via INTRAVENOUS

## 2017-10-30 MED ORDER — PROPOFOL 10 MG/ML IV BOLUS
INTRAVENOUS | Status: DC | PRN
  Administered 2017-10-30: 120 mg via INTRAVENOUS

## 2017-10-30 MED ORDER — MIDAZOLAM HCL 5 MG/5ML IJ SOLN
INTRAMUSCULAR | Status: DC | PRN
  Administered 2017-10-30: 2 mg via INTRAVENOUS

## 2017-10-30 MED ORDER — CIPROFLOXACIN IN D5W 400 MG/200ML IV SOLN
INTRAVENOUS | Status: DC | PRN
  Administered 2017-10-30: 400 mg via INTRAVENOUS

## 2017-10-30 MED ORDER — MIDAZOLAM HCL 2 MG/2ML IJ SOLN
INTRAMUSCULAR | Status: AC
  Filled 2017-10-30: qty 2

## 2017-10-30 MED ORDER — SODIUM CHLORIDE 0.9 % IV SOLN: INTRAVENOUS | Status: DC

## 2017-10-30 MED ORDER — PHENYLEPHRINE 40 MCG/ML (10ML) SYRINGE FOR IV PUSH (FOR BLOOD PRESSURE SUPPORT)
PREFILLED_SYRINGE | INTRAVENOUS | Status: DC | PRN
  Administered 2017-10-30 (×8): 80 ug via INTRAVENOUS

## 2017-10-30 MED ORDER — LACTATED RINGERS IV SOLN
INTRAVENOUS | Status: DC
Start: 2017-10-30 — End: 2017-10-30
  Administered 2017-10-30 (×2): via INTRAVENOUS

## 2017-10-30 MED ORDER — SODIUM CHLORIDE 0.9 % IV SOLN
INTRAVENOUS | Status: DC
  Administered 2017-10-30: 1000 mL via INTRAVENOUS

## 2017-10-30 MED ORDER — EPHEDRINE SULFATE-NACL 50-0.9 MG/10ML-% IV SOSY
PREFILLED_SYRINGE | INTRAVENOUS | Status: DC | PRN
  Administered 2017-10-30 (×5): 10 mg via INTRAVENOUS

## 2017-10-30 MED ORDER — LIDOCAINE 2% (20 MG/ML) 5 ML SYRINGE
INTRAMUSCULAR | Status: DC | PRN
  Administered 2017-10-30: 100 mg via INTRAVENOUS

## 2017-10-30 MED ORDER — SODIUM CHLORIDE 0.9 % IV SOLN
INTRAVENOUS | Status: DC | PRN
  Administered 2017-10-30: 40 mL

## 2017-10-30 MED ORDER — PROPOFOL 10 MG/ML IV BOLUS
INTRAVENOUS | Status: AC
  Filled 2017-10-30: qty 20

## 2017-10-30 MED ORDER — ONDANSETRON HCL 4 MG/2ML IJ SOLN
INTRAMUSCULAR | Status: DC | PRN
  Administered 2017-10-30: 4 mg via INTRAVENOUS

## 2017-10-30 MED ORDER — ROCURONIUM BROMIDE 10 MG/ML (PF) SYRINGE
PREFILLED_SYRINGE | INTRAVENOUS | Status: DC | PRN
  Administered 2017-10-30: 50 mg via INTRAVENOUS

## 2017-10-30 MED ORDER — INDOMETHACIN 50 MG RE SUPP
RECTAL | Status: AC
  Filled 2017-10-30: qty 2

## 2017-10-30 NOTE — Op Note (Signed)
[facility] Patient Name: [facility] Barker Procedure Date: 10/30/2017 MRN: [id number] Attending MD: Clarene Essex , MD Date of Birth: 05-23-1956 CSN: [id number] Age: 62 Admit Type: Outpatient Procedure:                ERCP Indications:              Abnormal abdominal CT, Abnormal MRCP, Malignant                            gallbladder tumor, obstructive jaundice Providers:                Clarene Essex, MD, Burtis Junes, RN, Tinnie Gens,                            Technician, [facility], CRNA Referring MD:              Medicines:                General Anesthesia Complications:            No immediate complications. Estimated Blood Loss:     Estimated blood loss: none. Procedure:                Pre-Anesthesia Assessment:                           - Prior to the procedure, a History and Physical                            was performed, and patient medications and                            allergies were reviewed. The patient's tolerance of                            previous anesthesia was also reviewed. The risks                            and benefits of the procedure and the sedation                            options and risks were discussed with the patient.                            All questions were answered, and informed consent                            was obtained. Prior Anticoagulants: The patient has                            taken no previous anticoagulant or antiplatelet                            agents. ASA Grade Assessment: II - A patient with  mild systemic disease. After reviewing the risks                            and benefits, the patient was deemed in                            satisfactory condition to undergo the procedure.                           After obtaining informed consent, the scope was                            passed under direct vision. Throughout the                            procedure, the  patient's blood pressure, pulse, and                            oxygen saturations were monitored continuously. The                            [id number] ([id number]) scope was introduced through                            the mouth, and used to inject contrast into and                            used to cannulate the bile duct. The near                            obstructing duodenal old ulcerative probable mass                            was seen and we were surprisingly easily able to                            advance the scope past it. The ERCP was technically                            difficult and complex due to extrinsic compression.                            The patient tolerated the procedure well. Scope In: Scope Out: Findings:      The upper GI tract was traversed under direct vision without detailed       examination except as above. One non-bleeding cratered duodenal       ulcerative probable mass was found in the duodenal bulb. The major       papilla was normal. On initial cholangiogram unfortunately the wire went       towards the pancreas and on minimal injection this was confirmed and we       elected to place One 5 Fr by 3 cm plastic stent with a full external       pigtail and a  single internal flap was placed 2 cm into the ventral       pancreatic duct. The stent was in good position. We were then able to       fairly readily cannulate the CBD Biliary sphincterotomy wasinto the       right system and contrast was injected and an obvious porta hepatis       stricture was seen and we proceeded with a sphincterotomy using the       Hydratome sphincterotome using ERBE electrocautery. There was no       post-sphincterotomy bleeding. We had adequate biliary drainage and could       get the half to three quarters bowed sphincterotome easily in and out of       the duct we tried to place a second wire into the left system with the       sphincterotome and then later using the  balloon through the stent when       we were unable to advance the sphincterotome through the stent but could       not obtain access into the left system despite a prolonged effort The       hepatic duct bifurcation contained a single stenosis. We did place One       10 Fr by 8 cm transpapillary uncovered metal stent was placed 7.5 cm       into the right hepatic duct. Fluid flowed through the stent. The stent       was in good position. Initially just contrast flowed through the stent       and then some dirty bile was seen and after our prolonged effort to       cannulate the left side without success we elected to stop the procedure       at that point and the patient tolerated the procedure well Impression:               - One non-bleeding duodenal ulcer.                           - The major papilla appeared normal.                           - A biliary stricture was found. The stricture was                            malignant appearing. This stricture was treated                            with biliary sphincterotomy.                           - One plastic stent was placed into the ventral                            pancreatic duct. We did use the biopsy forceps to                            free the plastic stent from the metal stent                           -  A sphincterotomy was performed.                           - One uncovered metal stent was placed into the                            right hepatic duct. Moderate Sedation:      N/A- Per Anesthesia Care Recommendation:           - Clear liquid diet for 6 hours. May slowly advance                            later today if doing well                           - Continue present medications. Repeat liver tests                            in 5-7 days and consider a left-sided PTC if they                            do not drop low enough for chemotherapy otherwise                            see how this single stent works                            - Return to GI clinic PRN. Will need a x-ray in 2-4                            weeks to confirm PD stent passing                           - Telephone GI clinic if symptomatic PRN. Procedure Code(s):        --- Professional ---                           [phone], Endoscopic retrograde                            cholangiopancreatography (ERCP); with placement of                            endoscopic stent into biliary or pancreatic duct,                            including pre- and post-dilation and guide wire                            passage, when performed, including sphincterotomy,                            when performed, each stent  43274, 6, Endoscopic retrograde                            cholangiopancreatography (ERCP); with placement of                            endoscopic stent into biliary or pancreatic duct,                            including pre- and post-dilation and guide wire                            passage, when performed, including sphincterotomy,                            when performed, each stent                           43262, 59, Endoscopic retrograde                            cholangiopancreatography (ERCP); with                            sphincterotomy/papillotomy Diagnosis Code(s):        --- Professional ---                           K26.9, Duodenal ulcer, unspecified as acute or                            chronic, without hemorrhage or perforation                           K83.1, Obstruction of bile duct                           C23, Malignant neoplasm of gallbladder                           R93.5, Abnormal findings on diagnostic imaging of                            other abdominal regions, including retroperitoneum                           R93.2, Abnormal findings on diagnostic imaging of                            liver and biliary tract CPT copyright 2016 American Medical Association. All rights  reserved. The codes documented in this report are preliminary and upon coder review may  be revised to meet current compliance requirements. Clarene Essex, MD 10/30/2017 11:20:51 AM This report has been signed electronically. Number of Addenda: 0

## 2017-10-30 NOTE — Transfer of Care (Signed)
Immediate Anesthesia Transfer of Care Note  Patient: [facility]  Procedure(s) Performed: ENDOSCOPIC RETROGRADE CHOLANGIOPANCREATOGRAPHY (ERCP) (N/A )  Patient Location: PACU  Anesthesia Type:General  Level of Consciousness: awake, alert  and oriented  Airway & Oxygen Therapy: Patient Spontanous Breathing and Patient connected to face mask oxygen  Post-op Assessment: Report given to RN and Post -op Vital signs reviewed and stable  Post vital signs: Reviewed and stable  Last Vitals:  Vitals:   10/30/17 0820  BP: (!) 119/45  Pulse: 63  Resp: 12  Temp: 37.3 C  SpO2: 100%    Last Pain:  Vitals:   10/30/17 0820  TempSrc: Oral  PainSc:          Complications: No apparent anesthesia complications

## 2017-10-30 NOTE — Anesthesia Procedure Notes (Signed)
Procedure Name: Intubation Date/Time: 10/30/2017 9:44 AM Performed by: Abeera Flannery D, CRNA Pre-anesthesia Checklist: Patient identified, Emergency Drugs available, Suction available and Patient being monitored Patient Re-evaluated:Patient Re-evaluated prior to induction Oxygen Delivery Method: Circle system utilized Preoxygenation: Pre-oxygenation with 100% oxygen Induction Type: IV induction Ventilation: Mask ventilation without difficulty Laryngoscope Size: Mac and 3 Grade View: Grade I Tube type: Oral Number of attempts: 1 Airway Equipment and Method: Stylet Placement Confirmation: ETT inserted through vocal cords under direct vision,  positive ETCO2 and breath sounds checked- equal and bilateral Secured at: 20 cm Tube secured with: Tape Dental Injury: Teeth and Oropharynx as per pre-operative assessment

## 2017-10-30 NOTE — Discharge Instructions (Signed)
Endoscopic Retrograde Cholangiopancreatogram, Care After This sheet gives you information about how to care for yourself after your procedure. Your health care provider may also give you more specific instructions. If you have problems or questions, contact your health care provider. What can I expect after the procedure? After the procedure, it is common to have:  Soreness in your throat.  Nausea.  Bloating.  Dizziness.  Tiredness (fatigue).  Follow these instructions at home:  Take over-the-counter and prescription medicines only as told by your health care provider.  Do not drive for 24 hours if you were given a medicine to help you relax (sedative) during your procedure. Have someone stay with you for 24 hours after the procedure.  Return to your normal activities as told by your health care provider. Ask your health care provider what activities are safe for you.  Return to eating what you normally do as soon as you feel well enough or as told by your health care provider.  Keep all follow-up visits as told by your health care provider. This is important. Contact a health care provider if:  You have pain in your abdomen that does not get better with medicine.  You develop signs of infection, such as: ? Chills. ? Feeling unwell. Get help right away if:  You have difficulty swallowing.  You have worsening pain in your throat, chest, or abdomen.  You vomit bright red blood or a substance that looks like coffee grounds.  You have bloody or very black stools.  You have a fever.  You have a sudden increase in swelling (bloating) in your abdomen. Summary  After the procedure, it is common to feel tired and to have some discomfort in your throat.  Contact your health care provider if you have signs of infection--such as chills or feeling unwell--or if you have pain that does not improve with medicine.  Get help right away if you have trouble swallowing, worsening  pain, bloody or black vomit, bloody or black stools, a fever, or increased swelling in your abdomen.  Keep all follow-up visits as told by your health care provider. This is important. This information is not intended to replace advice given to you by your health care provider. Make sure you discuss any questions you have with your health care provider. Document Released: 06/18/2013 Document Revised: 07/17/2016 Document Reviewed: 07/17/2016 Elsevier Interactive Patient Education  2017 [employer].   Call if question or problem specifically increased pain nausea vomiting high fever or signs of GI bleeding or black diarrhea otherwise clear liquid diet until 5 PM and if doing well may have soft solids and follow-up with myself or Dr. Mary Sella as needed and repeat liver tests in 1 week

## 2017-10-30 NOTE — Anesthesia Postprocedure Evaluation (Signed)
Anesthesia Post Note  Patient: [facility]  Procedure(s) Performed: ENDOSCOPIC RETROGRADE CHOLANGIOPANCREATOGRAPHY (ERCP) (N/A )     Patient location during evaluation: Endoscopy Anesthesia Type: General Level of consciousness: awake and alert Pain management: pain level controlled Vital Signs Assessment: post-procedure vital signs reviewed and stable Respiratory status: spontaneous breathing, nonlabored ventilation, respiratory function stable and patient connected to nasal cannula oxygen Cardiovascular status: blood pressure returned to baseline and stable Postop Assessment: no apparent nausea or vomiting Anesthetic complications: no    Last Vitals:  Vitals:   10/30/17 1150 10/30/17 1153  BP: (!) 143/75   Pulse: (!) 57 (!) 58  Resp: 12 13  Temp:    SpO2: 98% 98%    Last Pain:  Vitals:   10/30/17 1208  TempSrc:   PainSc: 4                  Montez Hageman

## 2017-10-30 NOTE — Anesthesia Preprocedure Evaluation (Signed)
Anesthesia Evaluation  Patient identified by MRN, date of birth, ID band Patient awake    Reviewed: Allergy & Precautions, NPO status , Patient's Chart, lab work & pertinent test results  Airway Mallampati: II  TM Distance: >3 FB Neck ROM: Full    Dental no notable dental hx. (+) Partial Lower, Upper Dentures   Pulmonary former smoker,    Pulmonary exam normal breath sounds clear to auscultation       Cardiovascular hypertension, + CAD  Normal cardiovascular exam Rhythm:Regular Rate:Normal     Neuro/Psych negative neurological ROS  negative psych ROS   GI/Hepatic negative GI ROS, Neg liver ROS,   Endo/Other  diabetes, Type 2, Insulin Dependent  Renal/GU negative Renal ROS  negative genitourinary   Musculoskeletal negative musculoskeletal ROS (+)   Abdominal   Peds negative pediatric ROS (+)  Hematology negative hematology ROS (+)   Anesthesia Other Findings   Reproductive/Obstetrics negative OB ROS                             Anesthesia Physical Anesthesia Plan  ASA: III  Anesthesia Plan: General   Post-op Pain Management:    Induction: Intravenous  PONV Risk Score and Plan: 3 and Treatment may vary due to age or medical condition and Ondansetron  Airway Management Planned: Oral ETT  Additional Equipment:   Intra-op Plan:   Post-operative Plan: Extubation in OR  Informed Consent: I have reviewed the patients History and Physical, chart, labs and discussed the procedure including the risks, benefits and alternatives for the proposed anesthesia with the patient or authorized representative who has indicated his/her understanding and acceptance.   Dental advisory given  Plan Discussed with: CRNA  Anesthesia Plan Comments:         Anesthesia Quick Evaluation

## 2017-10-30 NOTE — Progress Notes (Signed)
Langston Reusing 9:34 AM  Subjective: Patient looks better than her story sounds and her case was discussed with her daughter as well and my partner Dr. Raliegh Ip and her hospital computer chart was reviewed and other than some lower abdominal discomfort she has no other specific complaints  Objective: Vital signs stable afebrile no acute distress exam please see preassessment evaluation labs CT and MRI biopsies and endoscopic findings reviewed  Assessment: Gallbladder cancer with metastases and obstructive jaundice  Plan: The risks benefits methods and success rate of ERCP and possibly 2 stents was discussed with the daughter and the patient and will proceed today with anesthesia assistance with further workup and plans pending those findings  [facility]  Pager [phone] After 5PM or if no answer call [phone]

## 2017-10-31 ENCOUNTER — Telehealth: Payer: Self-pay | Admitting: Oncology

## 2017-10-31 ENCOUNTER — Other Ambulatory Visit: Payer: Self-pay | Admitting: Oncology

## 2017-10-31 DIAGNOSIS — C23 Malignant neoplasm of gallbladder: Secondary | ICD-10-CM

## 2017-10-31 NOTE — Telephone Encounter (Signed)
Patient r/s due to procedure and need to follow up with MD first on 2/21.  All infusion and lab appts cancelled on 2/21 per L. Marcello Moores, [place] after she had a conversation with Dr. Benay Spice.  Scheduling request for patient was to be r/s to 2/28 for lab/MD/infusion - due to MD unavailabilities, patient was scheduled to 2/26.  Spoke with pt's daughter at 105 today to inform her of all new appts.  No questions at this time.

## 2017-11-01 ENCOUNTER — Inpatient Hospital Stay (HOSPITAL_BASED_OUTPATIENT_CLINIC_OR_DEPARTMENT_OTHER): Payer: BLUE CROSS/BLUE SHIELD | Admitting: Oncology

## 2017-11-01 ENCOUNTER — Inpatient Hospital Stay (HOSPITAL_COMMUNITY)
Admission: EM | Admit: 2017-11-01 | Discharge: 2017-11-05 | DRG: 871 | Disposition: A | Discharge status: Home / Self Care | Payer: BLUE CROSS/BLUE SHIELD | Attending: Family Medicine | Admitting: Family Medicine

## 2017-11-01 ENCOUNTER — Encounter (HOSPITAL_COMMUNITY): Payer: Self-pay | Admitting: Gastroenterology

## 2017-11-01 ENCOUNTER — Encounter: Payer: BLUE CROSS/BLUE SHIELD | Admitting: Nutrition

## 2017-11-01 ENCOUNTER — Telehealth: Payer: Self-pay | Admitting: Oncology

## 2017-11-01 ENCOUNTER — Ambulatory Visit: Payer: BLUE CROSS/BLUE SHIELD

## 2017-11-01 VITALS — BP 112/53 | HR 68 | Temp 98.6°F | Resp 17 | Ht 66.0 in | Wt 184.1 lb

## 2017-11-01 DIAGNOSIS — C787 Secondary malignant neoplasm of liver and intrahepatic bile duct: Secondary | ICD-10-CM | POA: Diagnosis present

## 2017-11-01 DIAGNOSIS — C801 Malignant (primary) neoplasm, unspecified: Secondary | ICD-10-CM | POA: Diagnosis present

## 2017-11-01 DIAGNOSIS — D259 Leiomyoma of uterus, unspecified: Secondary | ICD-10-CM

## 2017-11-01 DIAGNOSIS — E119 Type 2 diabetes mellitus without complications: Secondary | ICD-10-CM

## 2017-11-01 DIAGNOSIS — I2510 Atherosclerotic heart disease of native coronary artery without angina pectoris: Secondary | ICD-10-CM | POA: Diagnosis present

## 2017-11-01 DIAGNOSIS — I959 Hypotension, unspecified: Secondary | ICD-10-CM

## 2017-11-01 DIAGNOSIS — D500 Iron deficiency anemia secondary to blood loss (chronic): Secondary | ICD-10-CM | POA: Diagnosis present

## 2017-11-01 DIAGNOSIS — C23 Malignant neoplasm of gallbladder: Secondary | ICD-10-CM

## 2017-11-01 DIAGNOSIS — I10 Essential (primary) hypertension: Secondary | ICD-10-CM | POA: Diagnosis present

## 2017-11-01 DIAGNOSIS — R630 Anorexia: Secondary | ICD-10-CM | POA: Diagnosis not present

## 2017-11-01 DIAGNOSIS — Z8249 Family history of ischemic heart disease and other diseases of the circulatory system: Secondary | ICD-10-CM

## 2017-11-01 DIAGNOSIS — Z794 Long term (current) use of insulin: Secondary | ICD-10-CM

## 2017-11-01 DIAGNOSIS — N189 Chronic kidney disease, unspecified: Secondary | ICD-10-CM | POA: Diagnosis not present

## 2017-11-01 DIAGNOSIS — Z823 Family history of stroke: Secondary | ICD-10-CM

## 2017-11-01 DIAGNOSIS — K922 Gastrointestinal hemorrhage, unspecified: Secondary | ICD-10-CM | POA: Diagnosis present

## 2017-11-01 DIAGNOSIS — R509 Fever, unspecified: Secondary | ICD-10-CM | POA: Diagnosis not present

## 2017-11-01 DIAGNOSIS — T859XXA Unspecified complication of internal prosthetic device, implant and graft, initial encounter: Secondary | ICD-10-CM

## 2017-11-01 DIAGNOSIS — N390 Urinary tract infection, site not specified: Secondary | ICD-10-CM | POA: Diagnosis present

## 2017-11-01 DIAGNOSIS — N179 Acute kidney failure, unspecified: Secondary | ICD-10-CM | POA: Diagnosis present

## 2017-11-01 DIAGNOSIS — R6521 Severe sepsis with septic shock: Secondary | ICD-10-CM | POA: Diagnosis present

## 2017-11-01 DIAGNOSIS — A419 Sepsis, unspecified organism: Principal | ICD-10-CM | POA: Diagnosis present

## 2017-11-01 DIAGNOSIS — Z8711 Personal history of peptic ulcer disease: Secondary | ICD-10-CM

## 2017-11-01 DIAGNOSIS — K264 Chronic or unspecified duodenal ulcer with hemorrhage: Secondary | ICD-10-CM

## 2017-11-01 DIAGNOSIS — Z8719 Personal history of other diseases of the digestive system: Secondary | ICD-10-CM

## 2017-11-01 DIAGNOSIS — C7889 Secondary malignant neoplasm of other digestive organs: Secondary | ICD-10-CM | POA: Diagnosis present

## 2017-11-01 DIAGNOSIS — K8309 Other cholangitis: Secondary | ICD-10-CM | POA: Diagnosis present

## 2017-11-01 DIAGNOSIS — K831 Obstruction of bile duct: Secondary | ICD-10-CM | POA: Diagnosis not present

## 2017-11-01 DIAGNOSIS — Z87891 Personal history of nicotine dependence: Secondary | ICD-10-CM

## 2017-11-01 DIAGNOSIS — E785 Hyperlipidemia, unspecified: Secondary | ICD-10-CM | POA: Diagnosis present

## 2017-11-01 DIAGNOSIS — N200 Calculus of kidney: Secondary | ICD-10-CM | POA: Diagnosis present

## 2017-11-01 DIAGNOSIS — Z833 Family history of diabetes mellitus: Secondary | ICD-10-CM

## 2017-11-01 DIAGNOSIS — T8140XA Infection following a procedure, unspecified, initial encounter: Secondary | ICD-10-CM

## 2017-11-01 HISTORY — DX: Malignant neoplasm of gallbladder: C23

## 2017-11-01 MED ORDER — ONDANSETRON HCL 8 MG PO TABS: 8.0000 mg | ORAL_TABLET | Freq: Three times a day (TID) | ORAL | 0 refills | Status: AC | PRN

## 2017-11-01 MED ORDER — TRAMADOL HCL 50 MG PO TABS: 50.0000 mg | ORAL_TABLET | Freq: Two times a day (BID) | ORAL | 0 refills | Status: AC | PRN

## 2017-11-01 NOTE — ED Notes (Signed)
Bed: WA09 Expected date:  Expected time:  Means of arrival:  Comments: EMS 

## 2017-11-01 NOTE — Addendum Note (Signed)
Addendum  created 11/01/17 1549 by Lollie Sails, CRNA   Intraprocedure Staff edited

## 2017-11-01 NOTE — ED Triage Notes (Addendum)
Pt to ED via [facility], with complaints of increased weakness throughout today and fever since this morning that has gradually increased as well . Pt is a Liver and Gallbladder Cancer patient that has been diagnosed with a UTI 9 days ago and been on antibiotics since. Pt is complaining of chills. Pt has hx of Diabetes. Pt had a port placed this past Monday 10/29/17, and two stents placed on 10/30/17 one in the liver and one in the pancreas   Vitals  bp 122/52 Hr 90 18 RR 98% RA

## 2017-11-01 NOTE — Telephone Encounter (Signed)
No appts to add - per 2/21 los - appts already scheduled .

## 2017-11-01 NOTE — Progress Notes (Signed)
[facility] OFFICE PROGRESS NOTE   Diagnosis: Gallbladder cancer  INTERVAL HISTORY:   Ms. Heffernan returns as scheduled.  The bilirubin was markedly elevated when she was here last week.  She underwent an ERCP by Dr. Watt Climes on 10/30/2017.  An ulcerated mass was found in the duodenal bulb.  A stricture was noted at the porta hepatis.  A stent was placed into the right hepatic duct, the left side could not be cannulated. She reports improvement in pruritus since undergoing this procedure.  She continues to have abdominal pain. Objective:  Vital signs in last 24 hours:  Blood pressure (!) 112/53, pulse 68, temperature 98.6 F (37 C), temperature source Oral, resp. rate 17, height 5\' 6"  (1.676 m), weight 184 lb 1.6 oz (83.5 kg), SpO2 100 %.    HEENT: Scleral icterus Resp: Lungs clear bilaterally Cardio: Regular rate and rhythm GI: Fullness in the right upper abdomen without a discrete mass Vascular: No leg edema  Skin: Areas of nodularity corresponding to insulin injection sites over the abdominal wall  Portacath/PICC-without erythema  Lab Results:  Lab Results  Component Value Date   WBC 13.0 (H) 10/29/2017   HGB 8.8 (L) 10/29/2017   HCT 26.3 (L) 10/29/2017   MCV 89.2 10/29/2017   PLT 419 (H) 10/29/2017   NEUTROABS 10.0 (H) 10/22/2017    CMP     Component Value Date/Time   NA 126 (L) 10/22/2017 1535   K 5.2 (H) 10/22/2017 1535   CL 96 (L) 10/22/2017 1535   CO2 20 (L) 10/22/2017 1535   GLUCOSE 345 (H) 10/22/2017 1535   BUN 35 (H) 10/22/2017 1535   CREATININE 1.63 (H) 10/22/2017 1535   CALCIUM 9.8 10/22/2017 1535   PROT 7.0 10/22/2017 1535   ALBUMIN 1.9 (L) 10/22/2017 1535   AST 93 (H) 10/22/2017 1535   ALT 115 (H) 10/22/2017 1535   ALKPHOS 1,161 (H) 10/22/2017 1535   BILITOT 11.1 (HH) 10/22/2017 1535   GFRNONAA 33 (L) 10/22/2017 1535   GFRAA 38 (L) 10/22/2017 1535    Lab Results  Component Value Date   CEA1 8.4 (H) 10/18/2017    Lab Results    Component Value Date   INR 1.37 10/29/2017    Imaging:  Ir US Guide Vasc Access Right  Result Date: 10/29/2017 INDICATION: 62 year old female with gallbladder adenocarcinoma. She requires durable venous access for chemotherapy. EXAM: IMPLANTED PORT A CATH PLACEMENT WITH ULTRASOUND AND FLUOROSCOPIC GUIDANCE MEDICATIONS: 2 g Ancef; The antibiotic was administered within an appropriate time interval prior to skin puncture. ANESTHESIA/SEDATION: Versed 1.5 mg IV; Fentanyl 75 mcg IV; Moderate Sedation Time:  28 minutes The patient was continuously monitored during the procedure by the interventional radiology nurse under my direct supervision. FLUOROSCOPY TIME:  4 minutes, 0 seconds (59 mGy) COMPLICATIONS: None immediate. PROCEDURE: The right neck and chest was prepped with chlorhexidine, and draped in the usual sterile fashion using maximum barrier technique (cap and mask, sterile gown, sterile gloves, large sterile sheet, hand hygiene and cutaneous antiseptic). Antibiotic prophylaxis was provided with 2g Ancef administered IV one hour prior to skin incision. Local anesthesia was attained by infiltration with 1% lidocaine with epinephrine. Ultrasound demonstrated patency of the right internal jugular vein, and this was documented with an image. Under real-time ultrasound guidance, this vein was accessed with a 21 gauge micropuncture needle and image documentation was performed. A small dermatotomy was made at the access site with an 11 scalpel. A 0.018" wire was advanced into the SVC and  the access needle exchanged for a 14F micropuncture vascular sheath. The 0.018" wire was then removed and a 0.035" wire advanced into the IVC. An appropriate location for the subcutaneous reservoir was selected below the clavicle and an incision was made through the skin and underlying soft tissues. The subcutaneous tissues were then dissected using a combination of blunt and sharp surgical technique and a pocket was formed. A  single lumen power injectable portacatheter was then tunneled through the subcutaneous tissues from the pocket to the dermatotomy and the port reservoir placed within the subcutaneous pocket. The venous access site was then serially dilated and a peel away vascular sheath placed over the wire. The wire was removed and the port catheter advanced into position under fluoroscopic guidance. The catheter tip is positioned in the upper right atrium. This was documented with a spot image. The portacatheter was then tested and found to flush and aspirate well. The port was flushed with saline followed by 100 units/mL heparinized saline. The pocket was then closed in two layers using first subdermal inverted interrupted absorbable sutures followed by a running subcuticular suture. The epidermis was then sealed with Dermabond. The dermatotomy at the venous access site was also sealed with Dermabond. IMPRESSION: Successful placement of a right IJ approach Power Port with ultrasound and fluoroscopic guidance. The catheter is ready for use. Signed, Criselda Peaches, MD [facility] Radiology Electronically Signed   By: Jacqulynn Cadet M.D.   On: 10/29/2017 12:37   Dg Ercp Biliary & Pancreatic Ducts  Result Date: 10/30/2017 CLINICAL DATA:  Sphincterotomy; cholangiogram and biliary stent placement. History of gallbladder adenocarcinoma. EXAM: ERCP TECHNIQUE: Multiple spot images obtained with the fluoroscopic device and submitted for interpretation post-procedure. COMPARISON:  CT abdomen and pelvis - 10/26/2017; MRCP - 10/14/2017 FINDINGS: 7 spot fluoroscopic images of the right upper abdominal quadrant during ERCP are provided for review. Initial image demonstrates a ERCP probe overlying the right upper abdominal quadrant. There is selective cannulation and opacification of the common bile duct with suspected malignant narrowing/occlusion of the biliary hilum. There is  moderate to marked intrahepatic biliary dilatation within the opacified portions of the central aspect of the right intrahepatic biliary tree. There is no definitive opacification of the left intrahepatic biliary tree. There is an additional apparent internal plastic stent overlying the expected location of the pancreatic duct. Subsequent images demonstrate deployment of an internal biliary stent across the malignant narrowing of the biliary hilum from the intrahepatic right biliary tree to the level of the ampulla/duodenum. IMPRESSION: ERCP with apparent successful deployment of an internal biliary stent as detailed above. These images were submitted for radiologic interpretation only. Please see the procedural report for the amount of contrast and the fluoroscopy time utilized. Electronically Signed   By: Sandi Mariscal M.D.   On: 10/30/2017 14:31   Ir Fluoro Guide Port Insertion Right  Result Date: 10/29/2017 INDICATION: 62 year old female with gallbladder adenocarcinoma. She requires durable venous access for chemotherapy. EXAM: IMPLANTED PORT A CATH PLACEMENT WITH ULTRASOUND AND FLUOROSCOPIC GUIDANCE MEDICATIONS: 2 g Ancef; The antibiotic was administered within an appropriate time interval prior to skin puncture. ANESTHESIA/SEDATION: Versed 1.5 mg IV; Fentanyl 75 mcg IV; Moderate Sedation Time:  28 minutes The patient was continuously monitored during the procedure by the interventional radiology nurse under my direct supervision. FLUOROSCOPY TIME:  4 minutes, 0 seconds (59 mGy) COMPLICATIONS: None immediate. PROCEDURE: The right neck and chest was prepped with chlorhexidine, and draped in the usual sterile  fashion using maximum barrier technique (cap and mask, sterile gown, sterile gloves, large sterile sheet, hand hygiene and cutaneous antiseptic). Antibiotic prophylaxis was provided with 2g Ancef administered IV one hour prior to skin incision. Local anesthesia was attained by infiltration with 1%  lidocaine with epinephrine. Ultrasound demonstrated patency of the right internal jugular vein, and this was documented with an image. Under real-time ultrasound guidance, this vein was accessed with a 21 gauge micropuncture needle and image documentation was performed. A small dermatotomy was made at the access site with an 11 scalpel. A 0.018" wire was advanced into the SVC and the access needle exchanged for a 24F micropuncture vascular sheath. The 0.018" wire was then removed and a 0.035" wire advanced into the IVC. An appropriate location for the subcutaneous reservoir was selected below the clavicle and an incision was made through the skin and underlying soft tissues. The subcutaneous tissues were then dissected using a combination of blunt and sharp surgical technique and a pocket was formed. A single lumen power injectable portacatheter was then tunneled through the subcutaneous tissues from the pocket to the dermatotomy and the port reservoir placed within the subcutaneous pocket. The venous access site was then serially dilated and a peel away vascular sheath placed over the wire. The wire was removed and the port catheter advanced into position under fluoroscopic guidance. The catheter tip is positioned in the upper right atrium. This was documented with a spot image. The portacatheter was then tested and found to flush and aspirate well. The port was flushed with saline followed by 100 units/mL heparinized saline. The pocket was then closed in two layers using first subdermal inverted interrupted absorbable sutures followed by a running subcuticular suture. The epidermis was then sealed with Dermabond. The dermatotomy at the venous access site was also sealed with Dermabond. IMPRESSION: Successful placement of a right IJ approach Power Port with ultrasound and fluoroscopic guidance. The catheter is ready for use. Signed, Criselda Peaches, MD [facility]  Radiology Electronically Signed   By: Jacqulynn Cadet M.D.   On: 10/29/2017 12:37    Medications: I have reviewed the patient's current medications.   Assessment/Plan: 1.Metastatic adenocarcinoma, biopsy of a segment 5 liver mass 09/21/2017-adenocarcinoma  CT abdomen/pelvis 08/17/2017-gallbladder fossa mass, upper abdominal lymphadenopathy, liver metastases  MRI of the liver 10/14/2017-masslike appearance of the gallbladder with direct invasion of the adjacent liver, multiple hepatic metastases, upper abdominal nodal metastases  CT 10/26/2017- multiple liver metastases, intrahepatic biliary dilatation secondary to gallbladder mass and porta hepatis adenopathy  2.Severe anemia secondary to GI bleeding  Duodenal bulb ulcer noted on endoscopy 10/18/2017, biopsied- inflammation/necrosis consistent with ulceration, focal atypical epithelioid cells of unknown significance  3.Anorexia/weight loss  4.Diabetes  5.Uterine fibroids  6.   Biliary obstruction secondary to the gallbladder tumor/adenopathy-status post ERCP and placement of a right hepatic duct stent 10/30/2017    Disposition: Ms. Irigoyen appears stable.  She underwent placement of a right hepatic duct stent on 10/30/2017.  Hopefully this will relieve the biliary obstruction.  She has noted improvement in pruritus following placement of the stent.  She will be scheduled for an office visit and FOLFOX chemotherapy 11/06/2017.  We will check a chemistry panel that day.  She will contact us in the interim as needed.  We refilled prescriptions for tramadol and Zofran today.  Betsy Coder, MD  11/01/2017  9:48 AM

## 2017-11-02 ENCOUNTER — Telehealth: Payer: Self-pay | Admitting: *Deleted

## 2017-11-02 ENCOUNTER — Encounter (HOSPITAL_COMMUNITY): Payer: Self-pay | Admitting: Emergency Medicine

## 2017-11-02 ENCOUNTER — Emergency Department (HOSPITAL_COMMUNITY): Payer: BLUE CROSS/BLUE SHIELD

## 2017-11-02 ENCOUNTER — Inpatient Hospital Stay (HOSPITAL_COMMUNITY): Payer: BLUE CROSS/BLUE SHIELD

## 2017-11-02 DIAGNOSIS — N179 Acute kidney failure, unspecified: Secondary | ICD-10-CM

## 2017-11-02 DIAGNOSIS — E1165 Type 2 diabetes mellitus with hyperglycemia: Secondary | ICD-10-CM | POA: Diagnosis not present

## 2017-11-02 DIAGNOSIS — I10 Essential (primary) hypertension: Secondary | ICD-10-CM | POA: Diagnosis present

## 2017-11-02 DIAGNOSIS — N200 Calculus of kidney: Secondary | ICD-10-CM | POA: Diagnosis present

## 2017-11-02 DIAGNOSIS — Z8711 Personal history of peptic ulcer disease: Secondary | ICD-10-CM | POA: Diagnosis not present

## 2017-11-02 DIAGNOSIS — I2510 Atherosclerotic heart disease of native coronary artery without angina pectoris: Secondary | ICD-10-CM | POA: Diagnosis present

## 2017-11-02 DIAGNOSIS — D259 Leiomyoma of uterus, unspecified: Secondary | ICD-10-CM | POA: Diagnosis present

## 2017-11-02 DIAGNOSIS — T859XXA Unspecified complication of internal prosthetic device, implant and graft, initial encounter: Secondary | ICD-10-CM | POA: Diagnosis not present

## 2017-11-02 DIAGNOSIS — Z87891 Personal history of nicotine dependence: Secondary | ICD-10-CM | POA: Diagnosis not present

## 2017-11-02 DIAGNOSIS — Z833 Family history of diabetes mellitus: Secondary | ICD-10-CM | POA: Diagnosis not present

## 2017-11-02 DIAGNOSIS — I959 Hypotension, unspecified: Secondary | ICD-10-CM | POA: Diagnosis not present

## 2017-11-02 DIAGNOSIS — N390 Urinary tract infection, site not specified: Secondary | ICD-10-CM | POA: Diagnosis present

## 2017-11-02 DIAGNOSIS — R6521 Severe sepsis with septic shock: Secondary | ICD-10-CM | POA: Diagnosis not present

## 2017-11-02 DIAGNOSIS — Z8719 Personal history of other diseases of the digestive system: Secondary | ICD-10-CM | POA: Diagnosis not present

## 2017-11-02 DIAGNOSIS — E119 Type 2 diabetes mellitus without complications: Secondary | ICD-10-CM | POA: Diagnosis present

## 2017-11-02 DIAGNOSIS — C7889 Secondary malignant neoplasm of other digestive organs: Secondary | ICD-10-CM | POA: Diagnosis present

## 2017-11-02 DIAGNOSIS — E785 Hyperlipidemia, unspecified: Secondary | ICD-10-CM | POA: Diagnosis present

## 2017-11-02 DIAGNOSIS — Z794 Long term (current) use of insulin: Secondary | ICD-10-CM | POA: Diagnosis not present

## 2017-11-02 DIAGNOSIS — K922 Gastrointestinal hemorrhage, unspecified: Secondary | ICD-10-CM | POA: Diagnosis present

## 2017-11-02 DIAGNOSIS — D500 Iron deficiency anemia secondary to blood loss (chronic): Secondary | ICD-10-CM | POA: Diagnosis present

## 2017-11-02 DIAGNOSIS — C787 Secondary malignant neoplasm of liver and intrahepatic bile duct: Secondary | ICD-10-CM | POA: Diagnosis present

## 2017-11-02 DIAGNOSIS — Z823 Family history of stroke: Secondary | ICD-10-CM | POA: Diagnosis not present

## 2017-11-02 DIAGNOSIS — K8309 Other cholangitis: Secondary | ICD-10-CM | POA: Diagnosis present

## 2017-11-02 DIAGNOSIS — A419 Sepsis, unspecified organism: Secondary | ICD-10-CM | POA: Diagnosis present

## 2017-11-02 DIAGNOSIS — R509 Fever, unspecified: Secondary | ICD-10-CM | POA: Diagnosis present

## 2017-11-02 DIAGNOSIS — T8140XA Infection following a procedure, unspecified, initial encounter: Secondary | ICD-10-CM | POA: Diagnosis not present

## 2017-11-02 DIAGNOSIS — C23 Malignant neoplasm of gallbladder: Secondary | ICD-10-CM | POA: Diagnosis present

## 2017-11-02 DIAGNOSIS — Z8249 Family history of ischemic heart disease and other diseases of the circulatory system: Secondary | ICD-10-CM | POA: Diagnosis not present

## 2017-11-02 LAB — URINALYSIS, ROUTINE W REFLEX MICROSCOPIC
GLUCOSE, UA: 50 mg/dL — AB
KETONES UR: NEGATIVE mg/dL
NITRITE: NEGATIVE
Protein, ur: 100 mg/dL — AB
Specific Gravity, Urine: 1.017 (ref 1.005–1.030)
pH: 5 (ref 5.0–8.0)

## 2017-11-02 LAB — COMPREHENSIVE METABOLIC PANEL
ALK PHOS: 292 U/L — AB (ref 38–126)
ALT: 27 U/L (ref 14–54)
ALT: 48 U/L (ref 14–54)
ANION GAP: 6 (ref 5–15)
AST: 35 U/L (ref 15–41)
AST: 63 U/L — ABNORMAL HIGH (ref 15–41)
Albumin: 1.7 g/dL — ABNORMAL LOW (ref 3.5–5.0)
Alkaline Phosphatase: 497 U/L — ABNORMAL HIGH (ref 38–126)
Anion gap: 12 (ref 5–15)
BILIRUBIN TOTAL: 3.7 mg/dL — AB (ref 0.3–1.2)
BUN: 18 mg/dL (ref 6–20)
BUN: 32 mg/dL — ABNORMAL HIGH (ref 6–20)
CALCIUM: 5 mg/dL — AB (ref 8.9–10.3)
CALCIUM: 8.6 mg/dL — AB (ref 8.9–10.3)
CHLORIDE: 102 mmol/L (ref 101–111)
CHLORIDE: 122 mmol/L — AB (ref 101–111)
CO2: 11 mmol/L — AB (ref 22–32)
CO2: 17 mmol/L — ABNORMAL LOW (ref 22–32)
CREATININE: 0.87 mg/dL (ref 0.44–1.00)
Creatinine, Ser: 1.67 mg/dL — ABNORMAL HIGH (ref 0.44–1.00)
GFR calc Af Amer: >60 mL/min (ref >60)
GFR calc non Af Amer: >60 mL/min (ref >60)
GFR, EST AFRICAN AMERICAN: 37 mL/min — AB (ref >60)
GFR, EST NON AFRICAN AMERICAN: 32 mL/min — AB (ref >60)
Glucose, Bld: 175 mg/dL — ABNORMAL HIGH (ref 65–99)
Glucose, Bld: 240 mg/dL — ABNORMAL HIGH (ref 65–99)
Potassium: 2.1 mmol/L — CL (ref 3.5–5.1)
Potassium: 3.8 mmol/L (ref 3.5–5.1)
Sodium: 139 mmol/L (ref 135–145)
Sodium: 131 mmol/L — ABNORMAL LOW (ref 135–145)
Total Bilirubin: 6.3 mg/dL — ABNORMAL HIGH (ref 0.3–1.2)
Total Protein: 3.7 g/dL — ABNORMAL LOW (ref 6.5–8.1)
Total Protein: 6.4 g/dL — ABNORMAL LOW (ref 6.5–8.1)

## 2017-11-02 LAB — BASIC METABOLIC PANEL
ANION GAP: 13 (ref 5–15)
BUN: 30 mg/dL — ABNORMAL HIGH (ref 6–20)
CHLORIDE: 105 mmol/L (ref 101–111)
CO2: 15 mmol/L — AB (ref 22–32)
Calcium: 8 mg/dL — ABNORMAL LOW (ref 8.9–10.3)
Creatinine, Ser: 1.59 mg/dL — ABNORMAL HIGH (ref 0.44–1.00)
GFR calc non Af Amer: 33 mL/min — ABNORMAL LOW (ref >60)
GFR, EST AFRICAN AMERICAN: 39 mL/min — AB (ref >60)
GLUCOSE: 256 mg/dL — AB (ref 65–99)
Potassium: 4.3 mmol/L (ref 3.5–5.1)
Sodium: 133 mmol/L — ABNORMAL LOW (ref 135–145)

## 2017-11-02 LAB — CBC WITH DIFFERENTIAL/PLATELET
BASOS PCT: 0 %
BASOS PCT: 0 %
Basophils Absolute: 0 10*3/uL (ref 0.0–0.1)
Basophils Absolute: 0 10*3/uL (ref 0.0–0.1)
EOS ABS: 0 10*3/uL (ref 0.0–0.7)
EOS ABS: 0 10*3/uL (ref 0.0–0.7)
Eosinophils Relative: 1 %
Eosinophils Relative: 0 %
HCT: 25.4 % — ABNORMAL LOW (ref 36.0–46.0)
HEMATOCRIT: 17.8 % — AB (ref 36.0–46.0)
Hemoglobin: 5.5 g/dL — CL (ref 12.0–15.0)
Hemoglobin: 8 g/dL — ABNORMAL LOW (ref 12.0–15.0)
LYMPHS ABS: 0.3 10*3/uL — AB (ref 0.7–4.0)
Lymphocytes Relative: 3 %
Lymphocytes Relative: 3 %
Lymphs Abs: 0.2 10*3/uL — ABNORMAL LOW (ref 0.7–4.0)
MCH: 28.4 pg (ref 26.0–34.0)
MCH: 29.1 pg (ref 26.0–34.0)
MCHC: 30.9 g/dL (ref 30.0–36.0)
MCHC: 31.5 g/dL (ref 30.0–36.0)
MCV: 91.8 fL (ref 78.0–100.0)
MCV: 92.4 fL (ref 78.0–100.0)
MONO ABS: 0.3 10*3/uL (ref 0.1–1.0)
Monocytes Absolute: 0.1 10*3/uL (ref 0.1–1.0)
Monocytes Relative: 2 %
Monocytes Relative: 3 %
NEUTROS ABS: 4.8 10*3/uL (ref 1.7–7.7)
NEUTROS ABS: 9.5 10*3/uL — AB (ref 1.7–7.7)
Neutrophils Relative %: 94 %
Neutrophils Relative %: 94 %
PLATELETS: 306 10*3/uL (ref 150–400)
Platelets: 196 10*3/uL (ref 150–400)
RBC: 1.94 MIL/uL — AB (ref 3.87–5.11)
RBC: 2.75 MIL/uL — ABNORMAL LOW (ref 3.87–5.11)
RDW: 17.9 % — ABNORMAL HIGH (ref 11.5–15.5)
RDW: 18.2 % — ABNORMAL HIGH (ref 11.5–15.5)
WBC: 10.1 10*3/uL (ref 4.0–10.5)
WBC: 5 10*3/uL (ref 4.0–10.5)

## 2017-11-02 LAB — CBG MONITORING, ED
GLUCOSE-CAPILLARY: 219 mg/dL — AB (ref 65–99)
Glucose-Capillary: 233 mg/dL — ABNORMAL HIGH (ref 65–99)
Glucose-Capillary: 178 mg/dL — ABNORMAL HIGH (ref 65–99)
Glucose-Capillary: 209 mg/dL — ABNORMAL HIGH (ref 65–99)

## 2017-11-02 LAB — RESPIRATORY PANEL BY PCR
ADENOVIRUS-RVPPCR: NOT DETECTED
Bordetella pertussis: NOT DETECTED
CHLAMYDOPHILA PNEUMONIAE-RVPPCR: NOT DETECTED
CORONAVIRUS NL63-RVPPCR: NOT DETECTED
Coronavirus 229E: NOT DETECTED
Coronavirus HKU1: NOT DETECTED
Coronavirus OC43: NOT DETECTED
INFLUENZA A-RVPPCR: NOT DETECTED
Influenza B: NOT DETECTED
MYCOPLASMA PNEUMONIAE-RVPPCR: NOT DETECTED
Metapneumovirus: NOT DETECTED
PARAINFLUENZA VIRUS 4-RVPPCR: NOT DETECTED
Parainfluenza Virus 1: NOT DETECTED
Parainfluenza Virus 2: NOT DETECTED
Parainfluenza Virus 3: NOT DETECTED
Respiratory Syncytial Virus: NOT DETECTED
Rhinovirus / Enterovirus: NOT DETECTED

## 2017-11-02 LAB — GLUCOSE, CAPILLARY: Glucose-Capillary: 124 mg/dL — ABNORMAL HIGH (ref 65–99)

## 2017-11-02 LAB — MAGNESIUM: Magnesium: 1.4 mg/dL — ABNORMAL LOW (ref 1.7–2.4)

## 2017-11-02 LAB — I-STAT CG4 LACTIC ACID, ED
LACTIC ACID, VENOUS: 2.54 mmol/L — AB (ref 0.5–1.9)
LACTIC ACID, VENOUS: 3.51 mmol/L — AB (ref 0.5–1.9)
LACTIC ACID, VENOUS: 4.76 mmol/L — AB (ref 0.5–1.9)
Lactic Acid, Venous: 4.27 mmol/L (ref 0.5–1.9)

## 2017-11-02 LAB — LACTIC ACID, PLASMA: Lactic Acid, Venous: 1.4 mmol/L (ref 0.5–1.9)

## 2017-11-02 LAB — PROCALCITONIN: PROCALCITONIN: 42.29 ng/mL

## 2017-11-02 MED ORDER — PANTOPRAZOLE SODIUM 40 MG IV SOLR
40.0000 mg | Freq: Once | INTRAVENOUS | Status: AC
  Administered 2017-11-02: 40 mg via INTRAVENOUS
  Filled 2017-11-02: qty 40

## 2017-11-02 MED ORDER — HEPARIN SODIUM (PORCINE) 5000 UNIT/ML IJ SOLN
5000.0000 [IU] | Freq: Three times a day (TID) | INTRAMUSCULAR | Status: DC
  Administered 2017-11-02 – 2017-11-04 (×8): 5000 [IU] via SUBCUTANEOUS
  Filled 2017-11-02 (×12): qty 1

## 2017-11-02 MED ORDER — SODIUM CHLORIDE 0.9 % IV BOLUS (SEPSIS)
1000.0000 mL | Freq: Once | INTRAVENOUS | Status: AC
  Administered 2017-11-02: 1000 mL via INTRAVENOUS

## 2017-11-02 MED ORDER — PIPERACILLIN-TAZOBACTAM 3.375 G IVPB
3.3750 g | Freq: Three times a day (TID) | INTRAVENOUS | Status: DC
  Administered 2017-11-02 – 2017-11-05 (×10): 3.375 g via INTRAVENOUS
  Filled 2017-11-02 (×12): qty 50

## 2017-11-02 MED ORDER — SODIUM CHLORIDE 0.9 % IV SOLN
1.0000 g | Freq: Once | INTRAVENOUS | Status: DC
  Filled 2017-11-02: qty 10

## 2017-11-02 MED ORDER — MAGNESIUM SULFATE 2 GM/50ML IV SOLN
2.0000 g | Freq: Once | INTRAVENOUS | Status: AC
  Administered 2017-11-02: 2 g via INTRAVENOUS
  Filled 2017-11-02: qty 50

## 2017-11-02 MED ORDER — VANCOMYCIN HCL IN DEXTROSE 1-5 GM/200ML-% IV SOLN: 1000.0000 mg | Freq: Once | INTRAVENOUS | Status: DC

## 2017-11-02 MED ORDER — PIPERACILLIN-TAZOBACTAM 3.375 G IVPB 30 MIN
3.3750 g | Freq: Once | INTRAVENOUS | Status: AC
  Administered 2017-11-02: 3.375 g via INTRAVENOUS
  Filled 2017-11-02: qty 50

## 2017-11-02 MED ORDER — PIPERACILLIN-TAZOBACTAM 4.5 G IVPB: 4.5000 g | Freq: Once | INTRAVENOUS | Status: DC

## 2017-11-02 MED ORDER — ACETAMINOPHEN 325 MG PO TABS
650.0000 mg | ORAL_TABLET | Freq: Four times a day (QID) | ORAL | Status: DC | PRN
  Administered 2017-11-03: 650 mg via ORAL
  Filled 2017-11-02: qty 2

## 2017-11-02 MED ORDER — POTASSIUM CHLORIDE 10 MEQ/100ML IV SOLN
10.0000 meq | INTRAVENOUS | Status: DC
  Administered 2017-11-02: 10 meq via INTRAVENOUS
  Filled 2017-11-02: qty 100

## 2017-11-02 MED ORDER — IBUPROFEN 200 MG PO TABS
400.0000 mg | ORAL_TABLET | Freq: Once | ORAL | Status: AC
  Administered 2017-11-02: 400 mg via ORAL
  Filled 2017-11-02: qty 2

## 2017-11-02 MED ORDER — NOREPINEPHRINE BITARTRATE 1 MG/ML IV SOLN: 0.0000 ug/min | Freq: Once | INTRAVENOUS | Status: DC

## 2017-11-02 MED ORDER — SODIUM CHLORIDE 0.9 % IV SOLN: 250.0000 mL | INTRAVENOUS | Status: DC | PRN

## 2017-11-02 MED ORDER — METRONIDAZOLE IN NACL 5-0.79 MG/ML-% IV SOLN
500.0000 mg | Freq: Once | INTRAVENOUS | Status: AC
  Administered 2017-11-02: 500 mg via INTRAVENOUS
  Filled 2017-11-02: qty 100

## 2017-11-02 MED ORDER — PANTOPRAZOLE SODIUM 40 MG IV SOLR
40.0000 mg | INTRAVENOUS | Status: DC
  Administered 2017-11-02: 40 mg via INTRAVENOUS
  Filled 2017-11-02: qty 40

## 2017-11-02 MED ORDER — VANCOMYCIN HCL IN DEXTROSE 750-5 MG/150ML-% IV SOLN
750.0000 mg | INTRAVENOUS | Status: DC
  Filled 2017-11-02: qty 150

## 2017-11-02 MED ORDER — ACETAMINOPHEN 650 MG RE SUPP
650.0000 mg | Freq: Four times a day (QID) | RECTAL | Status: DC | PRN
Start: 2017-11-02 — End: 2017-11-05

## 2017-11-02 MED ORDER — SODIUM CHLORIDE 0.9 % IV SOLN
INTRAVENOUS | Status: DC
  Administered 2017-11-02 – 2017-11-04 (×2): via INTRAVENOUS

## 2017-11-02 MED ORDER — VANCOMYCIN HCL 10 G IV SOLR
2000.0000 mg | Freq: Once | INTRAVENOUS | Status: AC
  Administered 2017-11-02: 2000 mg via INTRAVENOUS
  Filled 2017-11-02: qty 2000

## 2017-11-02 MED ORDER — NOREPINEPHRINE 4 MG/250ML-% IV SOLN
0.0000 ug/min | INTRAVENOUS | Status: DC
  Filled 2017-11-02: qty 250

## 2017-11-02 MED ORDER — INSULIN ASPART 100 UNIT/ML ~~LOC~~ SOLN
2.0000 [IU] | SUBCUTANEOUS | Status: DC
  Administered 2017-11-02: 6 [IU] via SUBCUTANEOUS
  Administered 2017-11-02: 4 [IU] via SUBCUTANEOUS
  Administered 2017-11-02 (×2): 6 [IU] via SUBCUTANEOUS
  Administered 2017-11-03: 2 [IU] via SUBCUTANEOUS
  Filled 2017-11-02 (×4): qty 1

## 2017-11-02 NOTE — ED Notes (Signed)
ULTRASOUND AT BEDSIDE

## 2017-11-02 NOTE — H&P (Signed)
PULMONARY / CRITICAL CARE MEDICINE   Name: Julie Barker MRN: [id number] DOB: [birth year]    ADMISSION DATE:  11/01/2017 CONSULTATION DATE: 11/01/17  REFERRING MD: ED MD  CHIEF COMPLAINT: septic shock  HISTORY OF PRESENT ILLNESS:   61yoF with hx Gallbladder adenocarcinoma with liver mets, DM, HTN, CAD, Duodenal ulcer (diagnosed 10/18/17), recent port placement (10/29/17), and recent ERCP with biliary stent placement (10/30/17), now presents to the ER with generalized weakness and chills x 1 day. In the ER she is febrile to 103.9. She is found to be in septic shock and is given IVF's and broad spectrum antibiotics. Several family members are present and are appropriately concerned. Patient denies SOB, Cough, CP, Abd pain, N/V/D, Diarrhea, or pain at her port site.   PAST MEDICAL HISTORY :  She  has a past medical history of Coronary artery disease, Diabetes mellitus, Duodenal ulcer: Per EGD 10/18/2017 (10/18/2017), Esophageal ulceration: EGD 10/18/2017 (10/18/2017), Fibroid tumor, Hyperlipidemia, Hypertension, Liver cancer ([place]), Primary cancer of gallbladder with metastasis to other site [facility]), and Type 2 diabetes mellitus without complication ([place]) (02/16/1274).  PAST SURGICAL HISTORY: She  has a past surgical history that includes Cardiac catheterization (04/05/2007); Cardiovascular stress test (02/28/2007); Colonoscopy with propofol (N/A, 05/31/2015); Esophagogastroduodenoscopy (egd) with propofol (Left, 10/18/2017); Mouth surgery; IR FLUORO GUIDE PORT INSERTION RIGHT (10/29/2017); IR US Guide Vasc Access Right (10/29/2017); and ERCP (N/A, 10/30/2017).  No Known Allergies  No current facility-administered medications on file prior to encounter.    Current Outpatient Medications on File Prior to Encounter  Medication Sig  . amLODipine (NORVASC) 10 MG tablet Take 10 mg by mouth every morning.  . carvedilol (COREG) 25 MG tablet TAKE ONE TABLET BY MOUTH TWICE DAILY WITH MEALS (Patient taking differently:  TAKE 25 MG BY MOUTH TWICE DAILY WITH MEALS)  . insulin aspart (NOVOLOG FLEXPEN) 100 UNIT/ML FlexPen Inject 2-10 Units into the skin 2 (two) times daily. Per sliding scale  . insulin glargine (LANTUS) 100 UNIT/ML injection Inject 25 Units into the skin 2 (two) times daily.   . isosorbide mononitrate (IMDUR) 60 MG 24 hr tablet Take 60 mg by mouth every morning.   . latanoprost (XALATAN) 0.005 % ophthalmic solution Place 1 drop into both eyes at bedtime.   . nitrofurantoin, macrocrystal-monohydrate, (MACROBID) 100 MG capsule Take 100 mg by mouth 2 (two) times daily.  . ondansetron (ZOFRAN) 8 MG tablet Take 1 tablet (8 mg total) by mouth every 8 (eight) hours as needed for nausea or vomiting.  . pantoprazole (PROTONIX) 40 MG tablet Take 1 tablet (40 mg total) by mouth 2 (two) times daily before a meal.  . polyethylene glycol (MIRALAX / GLYCOLAX) packet Take 17 g by mouth daily.  . prochlorperazine (COMPAZINE) 10 MG tablet Take 1 tablet (10 mg total) by mouth every 6 (six) hours as needed for nausea or vomiting.  . traMADol (ULTRAM) 50 MG tablet Take 1 tablet (50 mg total) by mouth every 12 (twelve) hours as needed for moderate pain or severe pain.  . ursodiol (ACTIGALL) 300 MG capsule Take 1 capsule (300 mg total) by mouth 3 (three) times daily.  Marland Kitchen lidocaine-prilocaine (EMLA) cream Apply 1hr prior to port use. Do not rub in. Cover with plastic wrap.  . ondansetron (ZOFRAN-ODT) 4 MG disintegrating tablet Take 4 mg by mouth every 6 (six) hours as needed for nausea or vomiting.    FAMILY HISTORY:  Her indicated that her mother is deceased. She indicated that her father is deceased. She indicated that all  of her three sisters are alive. She indicated that all of her five brothers are alive.  SOCIAL HISTORY: She  reports that she quit smoking about 19 years ago. she has never used smokeless tobacco. She reports that she does not drink alcohol or use drugs.  REVIEW OF SYSTEMS:   Review of Systems   Constitutional: Positive for chills and fever.  HENT: Negative.   Eyes: Negative.   Respiratory: Negative.   Cardiovascular: Negative.   Gastrointestinal: Negative.   Genitourinary: Negative.   Musculoskeletal: Negative.   Skin: Negative.   Neurological: Positive for weakness.  Endo/Heme/Allergies: Negative.   Psychiatric/Behavioral: Negative.    SUBJECTIVE:  Lying on ER stretcher, awake/alert appears in NAD. Soft BP.   VITAL SIGNS: BP (!) 99/55 (BP Location: Right Arm)   Pulse 71   Temp (!) 103.9 F (39.9 C) (Rectal)   Resp (!) 24   Ht 5\' 6"  (1.676 m)   Wt 84.4 kg (186 lb)   SpO2 98%   BMI 30.02 kg/m   HEMODYNAMICS:  Not on vasopressors. MAP 69  INTAKE / OUTPUT: No intake/output data recorded.  PHYSICAL EXAMINATION: General: Elderly female, appears older than stated age, lying on ER stretcher in NAD Neuro: Awake/alert, moving all extremities, answering questions, PERRL HEENT: OP clear, MM moist  Cardiovascular: RRR, no m/r/g Lungs: CTA b/l, no respiratory distress. Speaking in full sentences.  Abdomen: Soft, TTP in Epigastric and RUQ areas, no rebound. Normoactive bowel sounds. Hepatomegaly palpated.  Musculoskeletal: no LE edema Skin: no rashes   LABS:  BMET Recent Labs  Lab 11/02/17 0031 11/02/17 0114  NA 139 131*  K 2.1* 3.8  CL 122* 102  CO2 11* 17*  BUN 18 32*  CREATININE 0.87 1.67*  GLUCOSE 175* 240*   Electrolytes Recent Labs  Lab 11/02/17 0031 11/02/17 0114  CALCIUM 5.0* 8.6*  MG  --  1.4*   CBC Recent Labs  Lab 10/29/17 1004 11/02/17 0031 11/02/17 0114  WBC 13.0* 5.0 10.1  HGB 8.8* 5.5* 8.0*  HCT 26.3* 17.8* 25.4*  PLT 419* 196 306   Coag's Recent Labs  Lab 10/29/17 1004  INR 1.37   Sepsis Markers Recent Labs  Lab 11/02/17 0134 11/02/17 0222 11/02/17 0338  LATICACIDVEN 4.76* 4.27* 3.51*    ABG No results for input(s): PHART, PCO2ART, PO2ART in the last 168 hours.  Liver Enzymes Recent Labs  Lab  11/02/17 0031 11/02/17 0114  AST 35 63*  ALT 27 48  ALKPHOS 292* 497*  BILITOT 3.7* 6.3*  ALBUMIN <1.0* 1.7*   Cardiac Enzymes No results for input(s): TROPONINI, PROBNP in the last 168 hours.  Glucose Recent Labs  Lab 10/29/17 0934 10/30/17 0833  GLUCAP 184* 228*   Imaging No results found.  STUDIES:  CXR 2/22: no infiltrate on my review RUQ [place]: ordered  CULTURES: Blood cultures (2/22): pending UA: ordered  ANTIBIOTICS: Vancomycin 2/22 >> Zosyn 2/22 >> Flagyl 2/22  SIGNIFICANT EVENTS: 2/22: presented to ER in septic shock, received 4L IVF and Broad spectrum antibiotics  LINES/TUBES: Chest port  DISCUSSION: 61yoF with hx Gallbladder adenocarcinoma with liver mets, DM, HTN, CAD, Duodenal ulcer (diagnosed 10/18/17), recent port placement (10/29/17), and recent ERCP with biliary stent placement (10/30/17), now presents to the ER with generalized weakness and F/C and found to be in septic shock.   ASSESSMENT / PLAN:  PULMONARY No active issues   CARDIOVASCULAR 1. Hx HTN and CAD; Currently in shock: - hold home antihypertensive medications; shock discussed below  RENAL 1.  AKI: - creatinine 1.67 up from baseline 1.0; s/p 4L IVF bolus. Continue NS @ 75cc/hr. Place foley catheter. Monitor UOP and avoid nephrotoxic agents. Will stop the Ibuprofen that had been ordered. Repeat BMP later this AM. Renal ultrasound ordered.   2. Hypomagnesemia: - will replete with 2g Magnesium sulfate IV  GASTROINTESTINAL 1. Hx Duodenal Ulcer (diagnosed 10/18/17) - continue PPI daily  HEMATOLOGIC 1. Gallbladder adenocarcinoma with mets to liver - will consult Oncology in the AM  INFECTIOUS 1. Septic shock: - patient's recent biliary stent placement and her RUQ abdominal pain and fever are concerning that this sepsis could be due to ascending cholangitis. Other possibilities are bacteremia related to recent port placement. She has no other symptoms to suggest other etiologies at this  time.  - s/p 4L IVF bolus; continue NS @ 75cc/hr. Hold off on vasopressors at this time as MAP is > 65.  - 2 sets blood cultures have been sent. UA ordered. CXR on my review showed no infiltrates. Respiratory viral panel is pending although low suspicion for flu.  - continue Vanc and Zosyn; Check Procalcitonin. Trend lactate  - currently has a port so does need a central line; have asked RN to place a PIV so that we will have 2 sites of access.  ENDOCRINE 1. DM - NPO; SSI  NEUROLOGIC No active issues   FAMILY  - Updates: Updated multiple family members in ER exam room; also has a sister who is a Chartered certified accountant in ER. - Inter-disciplinary family meet or Palliative Care meeting due by:  11/08/17  60 minutes critical care time  Vernie Murders, MD  Pulmonary and [place] Pager: [phone]  11/02/2017, 6:51 AM

## 2017-11-02 NOTE — Progress Notes (Signed)
Pharmacy Antibiotic Note  Julie Barker is a 63 y.o. female admitted on 11/01/2017 with sepsis.  Pharmacy has been consulted for vancomycin dosing.  Plan: Vancomycin 2 Gm x1 then 750 mg IV q24h for est AUC=540 Goal AUC=400-500 F/u scr/cultures/levels ZEI  Height: 5\' 6"  (167.6 cm) Weight: 186 lb (84.4 kg) IBW/kg (Calculated) : 59.3  Temp (24hrs), Avg:101.2 F (38.4 C), Min:98.6 F (37 C), Max:103.9 F (39.9 C)  Recent Labs  Lab 10/29/17 1004 11/02/17 0031 11/02/17 0038 11/02/17 0114 11/02/17 0134 11/02/17 0222 11/02/17 0338  WBC 13.0* 5.0  --  10.1  --   --   --   CREATININE  --  0.87  --  1.67*  --   --   --   LATICACIDVEN  --   --  2.54*  --  4.76* 4.27* 3.51*    Estimated Creatinine Clearance: 37.7 mL/min (A) (by C-G formula based on SCr of 1.67 mg/dL (H)).    No Known Allergies  Antimicrobials this admission: 2/22 zosyn >>  2/22 vancomycin >>   Dose adjustments this admission:   Microbiology results:  BCx:   UCx:    Sputum:    MRSA PCR:   Thank you for allowing pharmacy to be a part of this patient's care.  Dorrene German 11/02/2017 5:39 AM

## 2017-11-02 NOTE — Progress Notes (Signed)
ER staff report labs from 00:31 were diluted and the results were in error due to dilution. Repeat labs at 01:14 believed to be accurate.

## 2017-11-02 NOTE — ED Notes (Addendum)
CBG 178. [place] ICU MD, CONTINUE WITH SLIDING SCALE AS ORDERED. VERIFIED ORDERS. 178 WILL GIVE 4 UNITS. Madaline Savage RN WITNESSED CONVERSATION. GIVEN AS ORDERED. Motley R RN VERIFIED

## 2017-11-02 NOTE — ED Notes (Signed)
HOSPITALIST at bedside, stated to hold off on levophed infusion per latest V/S .

## 2017-11-02 NOTE — ED Notes (Signed)
ED TO INPATIENT HANDOFF REPORT  Name/Age/Gender Julie Barker 61 y.o. female  Code Status    Code Status Orders  (From admission, onward)        Start     Ordered   11/02/17 0648  Full code  Continuous     11/02/17 0650    Code Status History    Date Active Date Inactive Code Status Order ID Comments User Context   10/17/2017 17:28 10/20/2017 21:50 Full Code [id number]  Florencia Reasons, MD Inpatient      Home/SNF/Other Home  Chief Complaint Fever  Level of Care/Admitting Diagnosis ED Disposition    ED Disposition Condition [place]: [facility] [[id number]]  Level of Care: ICU [6]  Diagnosis: Septic shock [facility]) [[id number]]  Admitting Physician: Reyne Dumas [[id number]]  Attending Physician: Reyne Dumas [[id number]]  Estimated length of stay: 3 - 4 days  Certification:: I certify this patient will need inpatient services for at least 2 midnights  PT Class (Do Not Modify): Inpatient [101]  PT Acc Code (Do Not Modify): Private [1]       Medical History Past Medical History:  Diagnosis Date  . Coronary artery disease    has diffuse distal vessel disease. Managed medically  . Diabetes mellitus   . Duodenal ulcer: Per EGD 10/18/2017 10/18/2017  . Esophageal ulceration: EGD 10/18/2017 10/18/2017  . Fibroid tumor   . Hyperlipidemia   . Hypertension   . Liver cancer ([place])   . 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 Type 2 diabetes mellitus without complication ([place]) 01/17/5637    Allergies No Known Allergies  IV Location/Drains/Wounds Patient Lines/Drains/Airways Status   Active Line/Drains/Airways    Name:   Placement date:   Placement time:   Site:   Days:   Implanted Port 10/29/17 Right Chest   10/29/17    1200    Chest   4   Peripheral IV 11/02/17 Left;Posterior Forearm   11/02/17    0653    Forearm   less than 1   Urethral Catheter Deandra Sanders Temperature probe 16 Fr.   11/02/17     0837    Temperature probe   less than 1   GI Stent 5 Fr.   10/30/17    1056    -   3   GI Stent 10 Fr.   10/30/17    1057    -   3          Labs/Imaging Results for orders placed or performed during the hospital encounter of 11/01/17 (from the past 48 hour(s))  Comprehensive metabolic panel     Status: Abnormal   Collection Time: 11/02/17 12:31 AM  Result Value Ref Range   Sodium 139 135 - 145 mmol/L   Potassium 2.1 (LL) 3.5 - 5.1 mmol/L    Comment: CRITICAL RESULT CALLED TO, READ BACK BY AND VERIFIED WITH: A TARIFO RN 0111 11/02/17 A NAVARRO    Chloride 122 (H) 101 - 111 mmol/L   CO2 11 (L) 22 - 32 mmol/L   Glucose, Bld 175 (H) 65 - 99 mg/dL   BUN 18 6 - 20 mg/dL   Creatinine, Ser 0.87 0.44 - 1.00 mg/dL   Calcium 5.0 (LL) 8.9 - 10.3 mg/dL    Comment: CRITICAL RESULT CALLED TO, READ BACK BY AND VERIFIED WITH: A TARIFO RN 0111 11/02/17 A NAVARRO    Total Protein 3.7 (L) 6.5 -  8.1 g/dL   Albumin <1.0 (L) 3.5 - 5.0 g/dL   AST 35 15 - 41 U/L   ALT 27 14 - 54 U/L   Alkaline Phosphatase 292 (H) 38 - 126 U/L   Total Bilirubin 3.7 (H) 0.3 - 1.2 mg/dL   GFR calc non Af Amer >60 >60 mL/min   GFR calc Af Amer >60 >60 mL/min    Comment: (NOTE) The eGFR has been calculated using the CKD EPI equation. This calculation has not been validated in all clinical situations. eGFR's persistently <60 mL/min signify possible Chronic Kidney Disease.    Anion gap 6 5 - 15    Comment: Performed at [facility], [place] [address], [place], [place] [address]  CBC with Differential/Platelet     Status: Abnormal   Collection Time: 11/02/17 12:31 AM  Result Value Ref Range   WBC 5.0 4.0 - 10.5 K/uL   RBC 1.94 (L) 3.87 - 5.11 MIL/uL   Hemoglobin 5.5 (LL) 12.0 - 15.0 g/dL    Comment: REPEATED TO VERIFY CRITICAL RESULT CALLED TO, READ BACK BY AND VERIFIED WITH: A TARIFO,RN '@0115'$  11/02/17 MKELLY    HCT 17.8 (L) 36.0 - 46.0 %   MCV 91.8 78.0 - 100.0 fL   MCH 28.4 26.0 - 34.0 pg    MCHC 30.9 30.0 - 36.0 g/dL   RDW 17.9 (H) 11.5 - 15.5 %   Platelets 196 150 - 400 K/uL   Neutrophils Relative % 94 %   Neutro Abs 4.8 1.7 - 7.7 K/uL   Lymphocytes Relative 3 %   Lymphs Abs 0.2 (L) 0.7 - 4.0 K/uL   Monocytes Relative 2 %   Monocytes Absolute 0.1 0.1 - 1.0 K/uL   Eosinophils Relative 1 %   Eosinophils Absolute 0.0 0.0 - 0.7 K/uL   Basophils Relative 0 %   Basophils Absolute 0.0 0.0 - 0.1 K/uL    Comment: Performed at [facility], [place] [address] [place], [place] [address]  I-Stat CG4 Lactic Acid, ED     Status: Abnormal   Collection Time: 11/02/17 12:38 AM  Result Value Ref Range   Lactic Acid, Venous 2.54 (HH) 0.5 - 1.9 mmol/L   Comment NOTIFIED PHYSICIAN   Magnesium     Status: Abnormal   Collection Time: 11/02/17  1:14 AM  Result Value Ref Range   Magnesium 1.4 (L) 1.7 - 2.4 mg/dL    Comment: Performed at [facility], [place] [address], [place], [place] [address]  CBC with Differential/Platelet     Status: Abnormal   Collection Time: 11/02/17  1:14 AM  Result Value Ref Range   WBC 10.1 4.0 - 10.5 K/uL    Comment: WHITE COUNT CONFIRMED ON SMEAR   RBC 2.75 (L) 3.87 - 5.11 MIL/uL   Hemoglobin 8.0 (L) 12.0 - 15.0 g/dL    Comment: DELTA CHECK NOTED   HCT 25.4 (L) 36.0 - 46.0 %   MCV 92.4 78.0 - 100.0 fL   MCH 29.1 26.0 - 34.0 pg   MCHC 31.5 30.0 - 36.0 g/dL   RDW 18.2 (H) 11.5 - 15.5 %   Platelets 306 150 - 400 K/uL    Comment: PLATELET COUNT CONFIRMED BY SMEAR SPECIMEN CHECKED FOR CLOTS    Neutrophils Relative % 94 %   Lymphocytes Relative 3 %   Monocytes Relative 3 %   Eosinophils Relative 0 %   Basophils Relative 0 %   Neutro Abs 9.5 (H) 1.7 - 7.7 K/uL  Lymphs Abs 0.3 (L) 0.7 - 4.0 K/uL   Monocytes Absolute 0.3 0.1 - 1.0 K/uL   Eosinophils Absolute 0.0 0.0 - 0.7 K/uL   Basophils Absolute 0.0 0.0 - 0.1 K/uL   RBC Morphology TARGET CELLS    WBC Morphology MILD LEFT SHIFT (1-5% METAS, OCC MYELO, OCC BANDS)      Comment: Performed at [facility], [place] [address], [place], [place] [address]  Comprehensive metabolic panel     Status: Abnormal   Collection Time: 11/02/17  1:14 AM  Result Value Ref Range   Sodium 131 (L) 135 - 145 mmol/L    Comment: DELTA CHECK NOTED REPEATED TO VERIFY    Potassium 3.8 3.5 - 5.1 mmol/L    Comment: DELTA CHECK NOTED REPEATED TO VERIFY NO VISIBLE HEMOLYSIS    Chloride 102 101 - 111 mmol/L   CO2 17 (L) 22 - 32 mmol/L   Glucose, Bld 240 (H) 65 - 99 mg/dL   BUN 32 (H) 6 - 20 mg/dL   Creatinine, Ser 1.67 (H) 0.44 - 1.00 mg/dL   Calcium 8.6 (L) 8.9 - 10.3 mg/dL    Comment: DELTA CHECK NOTED REPEATED TO VERIFY    Total Protein 6.4 (L) 6.5 - 8.1 g/dL   Albumin 1.7 (L) 3.5 - 5.0 g/dL   AST 63 (H) 15 - 41 U/L   ALT 48 14 - 54 U/L   Alkaline Phosphatase 497 (H) 38 - 126 U/L   Total Bilirubin 6.3 (H) 0.3 - 1.2 mg/dL   GFR calc non Af Amer 32 (L) >60 mL/min   GFR calc Af Amer 37 (L) >60 mL/min    Comment: (NOTE) The eGFR has been calculated using the CKD EPI equation. This calculation has not been validated in all clinical situations. eGFR's persistently <60 mL/min signify possible Chronic Kidney Disease.    Anion gap 12 5 - 15    Comment: Performed at [facility], [place] [address], [place], [place] [address]  I-Stat CG4 Lactic Acid, ED     Status: Abnormal   Collection Time: 11/02/17  1:34 AM  Result Value Ref Range   Lactic Acid, Venous 4.76 (HH) 0.5 - 1.9 mmol/L   Comment NOTIFIED PHYSICIAN   I-Stat CG4 Lactic Acid, ED     Status: Abnormal   Collection Time: 11/02/17  2:22 AM  Result Value Ref Range   Lactic Acid, Venous 4.27 (HH) 0.5 - 1.9 mmol/L   Comment NOTIFIED PHYSICIAN   I-Stat CG4 Lactic Acid, ED     Status: Abnormal   Collection Time: 11/02/17  3:38 AM  Result Value Ref Range   Lactic Acid, Venous 3.51 (HH) 0.5 - 1.9 mmol/L   Comment NOTIFIED PHYSICIAN   Urinalysis, Routine w reflex microscopic     Status:  Abnormal   Collection Time: 11/02/17  6:00 AM  Result Value Ref Range   Color, Urine AMBER (A) YELLOW    Comment: BIOCHEMICALS MAY BE AFFECTED BY COLOR   APPearance HAZY (A) CLEAR   Specific Gravity, Urine 1.017 1.005 - 1.030   pH 5.0 5.0 - 8.0   Glucose, UA 50 (A) NEGATIVE mg/dL   Hgb urine dipstick SMALL (A) NEGATIVE   Bilirubin Urine SMALL (A) NEGATIVE   Ketones, ur NEGATIVE NEGATIVE mg/dL   Protein, ur 100 (A) NEGATIVE mg/dL   Nitrite NEGATIVE NEGATIVE   Leukocytes, UA TRACE (A) NEGATIVE   RBC / HPF 0-5 0 - 5 RBC/hpf   WBC, UA 6-30 0 -  5 WBC/hpf   Bacteria, UA RARE (A) NONE SEEN   Squamous Epithelial / LPF 0-5 (A) NONE SEEN   Mucus PRESENT     Comment: Performed at [facility], [place] [address] [place], [place] [address]  Procalcitonin - Baseline     Status: None   Collection Time: 11/02/17  6:06 AM  Result Value Ref Range   Procalcitonin 42.29 ng/mL    Comment:        Interpretation: PCT >= 10 ng/mL: Important systemic inflammatory response, almost exclusively due to severe bacterial sepsis or septic shock. (NOTE)       Sepsis PCT Algorithm           Lower Respiratory Tract                                      Infection PCT Algorithm    ----------------------------     ----------------------------         PCT < 0.25 ng/mL                PCT < 0.10 ng/mL         Strongly encourage             Strongly discourage   discontinuation of antibiotics    initiation of antibiotics    ----------------------------     -----------------------------       PCT 0.25 - 0.50 ng/mL            PCT 0.10 - 0.25 ng/mL               OR       >80% decrease in PCT            Discourage initiation of                                            antibiotics      Encourage discontinuation           of antibiotics    ----------------------------     -----------------------------         PCT >= 0.50 ng/mL              PCT 0.26 - 0.50 ng/mL                AND       <80%  decrease in PCT             Encourage initiation of                                             antibiotics       Encourage continuation           of antibiotics    ----------------------------     -----------------------------        PCT >= 0.50 ng/mL                  PCT > 0.50 ng/mL               AND         increase in PCT  Strongly encourage                                      initiation of antibiotics    Strongly encourage escalation           of antibiotics                                     -----------------------------                                           PCT <= 0.25 ng/mL                                                 OR                                        > 80% decrease in PCT                                     Discontinue / Do not initiate                                             antibiotics Performed at [place] [address] [place], [place] [address]   Respiratory Panel by PCR     Status: None   Collection Time: 11/02/17  6:06 AM  Result Value Ref Range   Adenovirus NOT DETECTED NOT DETECTED   Coronavirus 229E NOT DETECTED NOT DETECTED   Coronavirus HKU1 NOT DETECTED NOT DETECTED   Coronavirus NL63 NOT DETECTED NOT DETECTED   Coronavirus OC43 NOT DETECTED NOT DETECTED   Metapneumovirus NOT DETECTED NOT DETECTED   Rhinovirus / Enterovirus NOT DETECTED NOT DETECTED   Influenza A NOT DETECTED NOT DETECTED   Influenza B NOT DETECTED NOT DETECTED   Parainfluenza Virus 1 NOT DETECTED NOT DETECTED   Parainfluenza Virus 2 NOT DETECTED NOT DETECTED   Parainfluenza Virus 3 NOT DETECTED NOT DETECTED   Parainfluenza Virus 4 NOT DETECTED NOT DETECTED   Respiratory Syncytial Virus NOT DETECTED NOT DETECTED   Bordetella pertussis NOT DETECTED NOT DETECTED   Chlamydophila pneumoniae NOT DETECTED NOT DETECTED   Mycoplasma pneumoniae NOT DETECTED NOT DETECTED    Comment: Performed at [facility] [address], [place], [place] [address]  CBG monitoring, ED     Status: Abnormal   Collection Time: 11/02/17  7:25 AM  Result Value Ref Range   Glucose-Capillary 233 (H) 65 - 99 mg/dL  Lactic acid, plasma     Status: None   Collection Time: 11/02/17  8:40 AM  Result Value Ref Range   Lactic Acid, Venous 1.4 0.5 - 1.9 mmol/L    Comment: Performed at [facility], [place] [address] [place],  [address]  Basic metabolic panel     Status: Abnormal   Collection Time: 11/02/17  8:40 AM  Result Value Ref Range   Sodium 133 (L) 135 - 145 mmol/L   Potassium 4.3 3.5 - 5.1 mmol/L   Chloride 105 101 - 111 mmol/L   CO2 15 (L) 22 - 32 mmol/L   Glucose, Bld 256 (H) 65 - 99 mg/dL   BUN 30 (H) 6 - 20 mg/dL   Creatinine, Ser 1.59 (H) 0.44 - 1.00 mg/dL   Calcium 8.0 (L) 8.9 - 10.3 mg/dL   GFR calc non Af Amer 33 (L) >60 mL/min   GFR calc Af Amer 39 (L) >60 mL/min    Comment: (NOTE) The eGFR has been calculated using the CKD EPI equation. This calculation has not been validated in all clinical situations. eGFR's persistently <60 mL/min signify possible Chronic Kidney Disease.    Anion gap 13 5 - 15    Comment: Performed at [facility], [place] [address], [place], [place] [address]  CBG monitoring, ED     Status: Abnormal   Collection Time: 11/02/17 12:47 PM  Result Value Ref Range   Glucose-Capillary 178 (H) 65 - 99 mg/dL  CBG monitoring, ED     Status: Abnormal   Collection Time: 11/02/17  4:45 PM  Result Value Ref Range   Glucose-Capillary 219 (H) 65 - 99 mg/dL  CBG monitoring, ED     Status: Abnormal   Collection Time: 11/02/17  8:09 PM  Result Value Ref Range   Glucose-Capillary 209 (H) 65 - 99 mg/dL   US Abdomen Complete  Result Date: 11/02/2017 CLINICAL DATA:  Gallbladder carcinoma with liver metastases EXAM: ABDOMEN ULTRASOUND COMPLETE COMPARISON:  CT abdomen and pelvis October 26, 2017 FINDINGS: Gallbladder: There is a mass in the gallbladder fossa region  measuring approximately 10 x 9 x 8 cm. There are areas of shadowing from this area, likely gallstones in trapped within the gallbladder mass. No pericholecystic fluid. Common bile duct: Diameter: 10 mm. Note that there is a stent within the biliary ductal system. Liver: There is diffuse intrahepatic biliary duct dilatation. Multiple liver masses are noted throughout the liver consistent with widespread metastatic disease. Largest individual mass measures approximately 5 x 3 cm. Portal vein is patent on color Doppler imaging with normal direction of blood flow towards the liver. IVC: No abnormality visualized. Pancreas: There is a mass at the head of the pancreas region measuring 3.0 x 2.2 x 3.2 cm. Spleen: Size and appearance within normal limits. Right Kidney: Length: 11.2 cm. Echogenicity is increased. There is relative renal cortical thinning. No mass or hydronephrosis visualized. Left Kidney: Length: 14.7 cm. Echogenicity within normal limits. No mass or hydronephrosis visualized. There is a calculus in the lower pole left kidney measuring 1.9 cm in length. There is trace perinephric fluid on the left. Abdominal aorta: No aneurysm visualized. Other findings: None. IMPRESSION: 1. Large mass arising in the gallbladder fossa felt to represent gallbladder carcinoma. Calculi are noted within this mass. 2. Biliary duct stent in place. Extensive intrahepatic biliary duct dilatation present. 3. Multiple liver masses consistent with metastases scattered throughout the liver. 4. Mass at level of head of pancreas measuring 3.0 x 2.2 x 3.2 cm. Question adenopathy versus mass arising from the pancreatic head. 5. Right kidney shows increased echogenicity and decrease in cortical thickness, likely due to a degree of atrophy. No obstructing focus right kidney. 6. Nonobstructing 1.9 cm calculus lower pole left kidney. Minimal perinephric fluid on  the left. No obstructing focus left kidney. Electronically Signed   By: Lowella Grip III M.D.   On: 11/02/2017 08:11   Dg Chest Port 1 View  Result Date: 11/02/2017 CLINICAL DATA:  Sepsis EXAM: PORTABLE CHEST 1 VIEW COMPARISON:  10/17/2017 FINDINGS: Right Port-A-Cath in place with the tip in the upper right atrium. Cardiomegaly. No confluent airspace opacities or effusions. No acute bony abnormality. IMPRESSION: Cardiomegaly.  No active disease. Electronically Signed   By: Rolm Baptise M.D.   On: 11/02/2017 07:10    Pending Labs Unresulted Labs (From admission, onward)   Start     Ordered   11/03/17 0500  Procalcitonin  Daily,   R     11/02/17 0606   11/03/17 0500  Comprehensive metabolic panel  Daily,   R     11/02/17 0610   11/03/17 0500  Magnesium  Daily,   R     11/02/17 0610   11/03/17 0500  Phosphorus  Daily,   R     11/02/17 0610   11/03/17 0500  CBC with Differential/Platelet  Daily,   R     11/02/17 0610   11/02/17 0606  Culture, blood (routine x 2)  BLOOD CULTURE X 2,   R     11/02/17 0606   11/02/17 0016  Urine culture  STAT,   STAT     11/02/17 0019   11/02/17 0014  Blood culture (routine x 2)  BLOOD CULTURE X 2,   STAT,   Status:  Canceled     11/02/17 0019      Vitals/Pain Today's Vitals   11/02/17 1915 11/02/17 2000 11/02/17 2100 11/02/17 2200  BP: 121/64 (!) 128/59 121/61 124/66  Pulse: 67 69 66 66  Resp: 19 13 (!) 26 (!) 23  Temp: 98.6 F (37 C) 98.8 F (37.1 C) 98.8 F (37.1 C) 98.8 F (37.1 C)  TempSrc:      SpO2: 100% 100% 98% 99%  Weight:      Height:      PainSc:        Isolation Precautions Droplet precaution  Medications Medications  piperacillin-tazobactam (ZOSYN) IVPB 3.375 g (0 g Intravenous Stopped 11/02/17 2115)  insulin aspart (novoLOG) injection 2-6 Units (6 Units Subcutaneous Given 11/02/17 2009)  norepinephrine (LEVOPHED) '4mg'$  in D5W 245m premix infusion (0 mcg/min Intravenous Hold 11/02/17 0615)  acetaminophen (TYLENOL) suppository 650 mg (not administered)    Or  acetaminophen (TYLENOL) tablet 650 mg  (not administered)  vancomycin (VANCOCIN) IVPB 750 mg/150 ml premix (not administered)  0.9 %  sodium chloride infusion (not administered)  heparin injection 5,000 Units (5,000 Units Subcutaneous Given 11/02/17 1643)  0.9 %  sodium chloride infusion ( Intravenous New Bag/Given 11/02/17 0819)  pantoprazole (PROTONIX) injection 40 mg (not administered)  ibuprofen (ADVIL,MOTRIN) tablet 400 mg (400 mg Oral Given 11/02/17 0052)  metroNIDAZOLE (FLAGYL) IVPB 500 mg (0 mg Intravenous Stopped 11/02/17 0204)  piperacillin-tazobactam (ZOSYN) IVPB 3.375 g (0 g Intravenous Stopped 11/02/17 0315)  pantoprazole (PROTONIX) injection 40 mg (40 mg Intravenous Given 11/02/17 0054)  sodium chloride 0.9 % bolus 1,000 mL (0 mLs Intravenous Stopped 11/02/17 0204)  sodium chloride 0.9 % bolus 1,000 mL (0 mLs Intravenous Stopped 11/02/17 0328)  sodium chloride 0.9 % bolus 1,000 mL (0 mLs Intravenous Stopped 11/02/17 0534)  sodium chloride 0.9 % bolus 1,000 mL (0 mLs Intravenous Stopped 11/02/17 0645)  vancomycin (VANCOCIN) 2,000 mg in sodium chloride 0.9 % 500 mL IVPB (0 mg Intravenous Stopped 11/02/17 0757)  magnesium sulfate IVPB 2 g 50 mL (0 g Intravenous Stopped 11/02/17 0754)    Mobility walks with person assist

## 2017-11-02 NOTE — Addendum Note (Signed)
Addendum  created 11/02/17 0758 by Montez Hageman, MD   Intraprocedure Staff edited

## 2017-11-02 NOTE — Telephone Encounter (Signed)
Call from pt's daughter to make MD aware that she is in the ED "waiting for ICU bed." Informed her that Dr. Benay Spice is aware. May see her as inpatient if she stays in the hospital.  Daughter reports FMLA forms will be faxed to office. Claim # [id number]

## 2017-11-02 NOTE — ED Notes (Signed)
SEVERAL BLANKETS REMOVED

## 2017-11-02 NOTE — ED Notes (Signed)
One set of blood cultures were drawn before antibiotics given.

## 2017-11-02 NOTE — Progress Notes (Signed)
[place] [facility] critical care attending  Subjective: Feels a little better this morning, off levo fed, making urine  On exam: Awake alert Belly soft, nontender nondistended Lungs clear to auscultation  BMET    Component Value Date/Time   NA 133 (L) 11/02/2017 0840   K 4.3 11/02/2017 0840   CL 105 11/02/2017 0840   CO2 15 (L) 11/02/2017 0840   GLUCOSE 256 (H) 11/02/2017 0840   BUN 30 (H) 11/02/2017 0840   CREATININE 1.59 (H) 11/02/2017 0840   CREATININE 1.63 (H) 10/22/2017 1535   CALCIUM 8.0 (L) 11/02/2017 0840   GFRNONAA 33 (L) 11/02/2017 0840   GFRNONAA 33 (L) 10/22/2017 1535   GFRAA 39 (L) 11/02/2017 0840   GFRAA 38 (L) 10/22/2017 1535   Lactic acid 1.4  Abdominal ultrasound: IMPRESSION: 1. Large mass arising in the gallbladder fossa felt to represent gallbladder carcinoma. Calculi are noted within this mass.  2. Biliary duct stent in place. Extensive intrahepatic biliary duct dilatation present.  3. Multiple liver masses consistent with metastases scattered throughout the liver.  4. Mass at level of head of pancreas measuring 3.0 x 2.2 x 3.2 cm. Question adenopathy versus mass arising from the pancreatic head.  5. Right kidney shows increased echogenicity and decrease in cortical thickness, likely due to a degree of atrophy. No obstructing focus right kidney.  6. Nonobstructing 1.9 cm calculus lower pole left kidney. Minimal perinephric fluid on the left. No obstructing focus left kidney.  Urinalysis shows some squamous epis, 5-30 white blood cells  Impression: Severe sepsis, improved blood pressure and lactic acid with volume resuscitation Acute kidney injury improved with volume resuscitation Question UTI Metastatic gallbladder carcinoma, possible pancreatic head mass and liver mass Nonobstructing calculus left kidney  Plan: Continue IV fluids Continue antibiotics Advance diet  Transfer to [facility] hospitalist  Roselie Awkward, MD [place]  PCCM Pager: [phone] Cell: [phone] After 3pm or if no response, call [phone]

## 2017-11-02 NOTE — ED Notes (Signed)
CBG 233 

## 2017-11-02 NOTE — ED Notes (Addendum)
WARM BLANKETS APPLIED TO ASSIST WITH TEMP. WILL RE ASSESS. OXYGEN 2LNC APPLIED FOR SUPPORT ONLY. VITALS STABLE

## 2017-11-02 NOTE — ED Provider Notes (Signed)
[facility] DEPT Provider Note: Georgena Spurling, MD, FACEP  CSN: [id number] MRN: [id number] ARRIVAL: 11/01/17 at 2355 ROOM: [place]  [facility]   HISTORY OF PRESENT ILLNESS  11/02/17 12:12 AM Julie Barker is a 62 y.o. 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  She underwent an ERCP 2 days ago with biliary stenting.  She has also been on Macrobid for a urinary tract infection.  She has not yet begun chemotherapy due to persistently elevated bilirubin.  She is here with fever and chills that began earlier today.  Her temperature is been as high as 101.5, and was 101.2 on arrival here.  She developed weakness this evening so severe she could not stand without assistance.  She was also confused earlier although this is improved.  She denies any pain.  She denies respiratory symptoms such as nasal congestion, cough or shortness of breath.  She denies nausea, vomiting or diarrhea.  She has not taken anything for her fever.  Her daughter does report seeing some blood in her urine earlier.   Past Medical History:  Diagnosis Date  . Coronary artery disease    has diffuse distal vessel disease. Managed medically  . Diabetes mellitus   . Duodenal ulcer: Per EGD 10/18/2017 10/18/2017  . Esophageal ulceration: EGD 10/18/2017 10/18/2017  . Fibroid tumor   . Hyperlipidemia   . Hypertension   . Liver cancer ([place])   . Primary cancer of gallbladder with metastasis to other site [facility])   . Type 2 diabetes mellitus without complication ([place]) 0/10/4095    Past Surgical History:  Procedure Laterality Date  . CARDIAC CATHETERIZATION  04/05/2007   EF 75%.MODERATE 3 VESSEL CAD  . CARDIOVASCULAR STRESS TEST  02/28/2007   EF 53%. NO ISCHEMIA  . COLONOSCOPY WITH PROPOFOL N/A 05/31/2015   Procedure: COLONOSCOPY WITH PROPOFOL;  Surgeon: Garlan Fair, MD;  Location: [facility] ENDOSCOPY;  Service: Endoscopy;  Laterality: N/A;  . ERCP N/A 10/30/2017   Procedure: ENDOSCOPIC  RETROGRADE CHOLANGIOPANCREATOGRAPHY (ERCP);  Surgeon: Clarene Essex, MD;  Location: [facility];  Service: Endoscopy;  Laterality: N/A;  . ESOPHAGOGASTRODUODENOSCOPY (EGD) WITH PROPOFOL Left 10/18/2017   Procedure: ESOPHAGOGASTRODUODENOSCOPY (EGD) WITH PROPOFOL;  Surgeon: Ronnette Juniper, MD;  Location: [facility] ENDOSCOPY;  Service: Gastroenterology;  Laterality: Left;  . IR FLUORO GUIDE PORT INSERTION RIGHT  10/29/2017  . IR US GUIDE VASC ACCESS RIGHT  10/29/2017  . MOUTH SURGERY      Family History  Problem Relation Age of Onset  . Aneurysm Mother   . Heart attack Father   . Stroke Father   . Hypertension Sister   . Diabetes Sister   . Rheumatic fever Sister   . Hypertension Brother   . Hypertension Brother   . Hypertension Brother   . Hypertension Sister   . Diabetes Sister     Social History   Tobacco Use  . Smoking status: Former Smoker    Last attempt to quit: 09/11/1998    Years since quitting: 19.1  . Smokeless tobacco: Never Used  Substance Use Topics  . Alcohol use: No  . Drug use: No    Prior to Admission medications   Medication Sig Start Date End Date Taking? Authorizing Provider  amLODipine (NORVASC) 10 MG tablet Take 10 mg by mouth every morning.   Yes [provider]  carvedilol (COREG) 25 MG tablet TAKE ONE TABLET BY MOUTH TWICE DAILY WITH MEALS Patient taking differently: TAKE 25 MG BY MOUTH TWICE DAILY  WITH MEALS 05/23/12  Yes Burtis Junes, NP  insulin aspart (NOVOLOG FLEXPEN) 100 UNIT/ML FlexPen Inject 2-10 Units into the skin 2 (two) times daily. Per sliding scale   Yes [provider]  insulin glargine (LANTUS) 100 UNIT/ML injection Inject 25 Units into the skin 2 (two) times daily.    Yes [provider]  isosorbide mononitrate (IMDUR) 60 MG 24 hr tablet Take 60 mg by mouth every morning.    Yes [provider]  latanoprost (XALATAN) 0.005 % ophthalmic solution Place 1 drop into both eyes at bedtime.    Yes [provider]  nitrofurantoin, macrocrystal-monohydrate, (MACROBID) 100 MG capsule Take 100 mg by mouth 2 (two) times daily.   Yes [provider]  ondansetron (ZOFRAN) 8 MG tablet Take 1 tablet (8 mg total) by mouth every 8 (eight) hours as needed for nausea or vomiting. 11/01/17  Yes Ladell Pier, MD  pantoprazole (PROTONIX) 40 MG tablet Take 1 tablet (40 mg total) by mouth 2 (two) times daily before a meal. 10/20/17  Yes Eugenie Filler, MD  polyethylene glycol (MIRALAX / GLYCOLAX) packet Take 17 g by mouth daily. 10/21/17  Yes Eugenie Filler, MD  prochlorperazine (COMPAZINE) 10 MG tablet Take 1 tablet (10 mg total) by mouth every 6 (six) hours as needed for nausea or vomiting. 10/25/17  Yes Ladell Pier, MD  traMADol (ULTRAM) 50 MG tablet Take 1 tablet (50 mg total) by mouth every 12 (twelve) hours as needed for moderate pain or severe pain. 11/01/17  Yes Ladell Pier, MD  ursodiol (ACTIGALL) 300 MG capsule Take 1 capsule (300 mg total) by mouth 3 (three) times daily. 10/20/17  Yes Eugenie Filler, MD  lidocaine-prilocaine (EMLA) cream Apply 1hr prior to port use. Do not rub in. Cover with plastic wrap. 10/25/17   Ladell Pier, MD  ondansetron (ZOFRAN-ODT) 4 MG disintegrating tablet Take 4 mg by mouth every 6 (six) hours as needed for nausea or vomiting. 10/16/17   [provider]    Allergies Patient has no known allergies.   REVIEW OF SYSTEMS  Negative except as noted here or in the History of Present Illness.   PHYSICAL EXAMINATION  Initial Vital Signs Blood pressure 128/63, pulse 90, temperature (!) 101.2 F (38.4 C), temperature source Oral, resp. rate 19, height 5\' 6"  (1.676 m), weight 84.4 kg (186 lb), SpO2 99 %.  Examination General: Well-developed, well-nourished female in no acute distress; appearance consistent with age of record HENT: normocephalic; atraumatic Eyes: pupils equal, round and reactive to light; extraocular muscles intact; scleral  icterus Neck: supple Heart: regular rate and rhythm Lungs: clear to auscultation bilaterally Abdomen: soft; distended; nontender; hepatomegaly; bowel sounds present Extremities: No deformity; full range of motion; pulses normal Neurologic: Awake, alert and oriented; motor function intact in all extremities and symmetric; no facial droop Skin: Warm and dry Psychiatric: Normal mood and affect   RESULTS  Summary of this visit's results, reviewed by myself:   EKG Interpretation  Date/Time:  Friday November 02 2017 01:28:55 EST Ventricular Rate:  84 PR Interval:    QRS Duration: 111 QT Interval:  380 QTC Calculation: 450 R Axis:   56 Text Interpretation:  Sinus rhythm Multiple ventricular premature complexes PVCs not seen previously Confirmed by Navneet Schmuck [phone]) on 11/02/2017 1:35:52 AM      Laboratory Studies: Results for orders placed or performed during the hospital encounter of 11/01/17 (from the past 24 hour(s))  Comprehensive metabolic panel  Status: Abnormal   Collection Time: 11/02/17 12:31 AM  Result Value Ref Range   Sodium 139 135 - 145 mmol/L   Potassium 2.1 (LL) 3.5 - 5.1 mmol/L   Chloride 122 (H) 101 - 111 mmol/L   CO2 11 (L) 22 - 32 mmol/L   Glucose, Bld 175 (H) 65 - 99 mg/dL   BUN 18 6 - 20 mg/dL   Creatinine, Ser 0.87 0.44 - 1.00 mg/dL   Calcium 5.0 (LL) 8.9 - 10.3 mg/dL   Total Protein 3.7 (L) 6.5 - 8.1 g/dL   Albumin <1.0 (L) 3.5 - 5.0 g/dL   AST 35 15 - 41 U/L   ALT 27 14 - 54 U/L   Alkaline Phosphatase 292 (H) 38 - 126 U/L   Total Bilirubin 3.7 (H) 0.3 - 1.2 mg/dL   GFR calc non Af Amer >60 >60 mL/min   GFR calc Af Amer >60 >60 mL/min   Anion gap 6 5 - 15  CBC with Differential/Platelet     Status: Abnormal   Collection Time: 11/02/17 12:31 AM  Result Value Ref Range   WBC 5.0 4.0 - 10.5 K/uL   RBC 1.94 (L) 3.87 - 5.11 MIL/uL   Hemoglobin 5.5 (LL) 12.0 - 15.0 g/dL   HCT 17.8 (L) 36.0 - 46.0 %   MCV 91.8 78.0 - 100.0 fL   MCH 28.4 26.0 -  34.0 pg   MCHC 30.9 30.0 - 36.0 g/dL   RDW 17.9 (H) 11.5 - 15.5 %   Platelets 196 150 - 400 K/uL   Neutrophils Relative % 94 %   Neutro Abs 4.8 1.7 - 7.7 K/uL   Lymphocytes Relative 3 %   Lymphs Abs 0.2 (L) 0.7 - 4.0 K/uL   Monocytes Relative 2 %   Monocytes Absolute 0.1 0.1 - 1.0 K/uL   Eosinophils Relative 1 %   Eosinophils Absolute 0.0 0.0 - 0.7 K/uL   Basophils Relative 0 %   Basophils Absolute 0.0 0.0 - 0.1 K/uL  I-Stat CG4 Lactic Acid, ED     Status: Abnormal   Collection Time: 11/02/17 12:38 AM  Result Value Ref Range   Lactic Acid, Venous 2.54 (HH) 0.5 - 1.9 mmol/L   Comment NOTIFIED PHYSICIAN   Magnesium     Status: Abnormal   Collection Time: 11/02/17  1:14 AM  Result Value Ref Range   Magnesium 1.4 (L) 1.7 - 2.4 mg/dL  CBC with Differential/Platelet     Status: Abnormal   Collection Time: 11/02/17  1:14 AM  Result Value Ref Range   WBC 10.1 4.0 - 10.5 K/uL   RBC 2.75 (L) 3.87 - 5.11 MIL/uL   Hemoglobin 8.0 (L) 12.0 - 15.0 g/dL   HCT 25.4 (L) 36.0 - 46.0 %   MCV 92.4 78.0 - 100.0 fL   MCH 29.1 26.0 - 34.0 pg   MCHC 31.5 30.0 - 36.0 g/dL   RDW 18.2 (H) 11.5 - 15.5 %   Platelets 306 150 - 400 K/uL   Neutrophils Relative % 94 %   Lymphocytes Relative 3 %   Monocytes Relative 3 %   Eosinophils Relative 0 %   Basophils Relative 0 %   Neutro Abs 9.5 (H) 1.7 - 7.7 K/uL   Lymphs Abs 0.3 (L) 0.7 - 4.0 K/uL   Monocytes Absolute 0.3 0.1 - 1.0 K/uL   Eosinophils Absolute 0.0 0.0 - 0.7 K/uL   Basophils Absolute 0.0 0.0 - 0.1 K/uL   RBC Morphology TARGET CELLS  WBC Morphology MILD LEFT SHIFT (1-5% METAS, OCC MYELO, OCC BANDS)   Comprehensive metabolic panel     Status: Abnormal   Collection Time: 11/02/17  1:14 AM  Result Value Ref Range   Sodium 131 (L) 135 - 145 mmol/L   Potassium 3.8 3.5 - 5.1 mmol/L   Chloride 102 101 - 111 mmol/L   CO2 17 (L) 22 - 32 mmol/L   Glucose, Bld 240 (H) 65 - 99 mg/dL   BUN 32 (H) 6 - 20 mg/dL   Creatinine, Ser 1.67 (H) 0.44 - 1.00  mg/dL   Calcium 8.6 (L) 8.9 - 10.3 mg/dL   Total Protein 6.4 (L) 6.5 - 8.1 g/dL   Albumin 1.7 (L) 3.5 - 5.0 g/dL   AST 63 (H) 15 - 41 U/L   ALT 48 14 - 54 U/L   Alkaline Phosphatase 497 (H) 38 - 126 U/L   Total Bilirubin 6.3 (H) 0.3 - 1.2 mg/dL   GFR calc non Af Amer 32 (L) >60 mL/min   GFR calc Af Amer 37 (L) >60 mL/min   Anion gap 12 5 - 15  I-Stat CG4 Lactic Acid, ED     Status: Abnormal   Collection Time: 11/02/17  1:34 AM  Result Value Ref Range   Lactic Acid, Venous 4.76 (HH) 0.5 - 1.9 mmol/L   Comment NOTIFIED PHYSICIAN   I-Stat CG4 Lactic Acid, ED     Status: Abnormal   Collection Time: 11/02/17  2:22 AM  Result Value Ref Range   Lactic Acid, Venous 4.27 (HH) 0.5 - 1.9 mmol/L   Comment NOTIFIED PHYSICIAN   I-Stat CG4 Lactic Acid, ED     Status: Abnormal   Collection Time: 11/02/17  3:38 AM  Result Value Ref Range   Lactic Acid, Venous 3.51 (HH) 0.5 - 1.9 mmol/L   Comment NOTIFIED PHYSICIAN   Urinalysis, Routine w reflex microscopic     Status: Abnormal   Collection Time: 11/02/17  6:00 AM  Result Value Ref Range   Color, Urine AMBER (A) YELLOW   APPearance HAZY (A) CLEAR   Specific Gravity, Urine 1.017 1.005 - 1.030   pH 5.0 5.0 - 8.0   Glucose, UA 50 (A) NEGATIVE mg/dL   Hgb urine dipstick SMALL (A) NEGATIVE   Bilirubin Urine SMALL (A) NEGATIVE   Ketones, ur NEGATIVE NEGATIVE mg/dL   Protein, ur 100 (A) NEGATIVE mg/dL   Nitrite NEGATIVE NEGATIVE   Leukocytes, UA TRACE (A) NEGATIVE   RBC / HPF 0-5 0 - 5 RBC/hpf   WBC, UA 6-30 0 - 5 WBC/hpf   Bacteria, UA RARE (A) NONE SEEN   Squamous Epithelial / LPF 0-5 (A) NONE SEEN   Mucus PRESENT    Imaging Studies: No results found.  ED COURSE  Nursing notes and initial vitals signs, including pulse oximetry, reviewed.  Vitals:   11/02/17 0530 11/02/17 0535 11/02/17 0600 11/02/17 0630  BP: (!) 93/53 (!) 96/50 (!) 101/53 (!) 99/55  Pulse: 72 71 73 71  Resp: (!) 22 (!) 24 (!) 26 (!) 26  Temp:      TempSrc:       SpO2: 97% 97% 99% 98%  Weight:      Height:       12:23 AM Blood cultures and lactic acid ordered for possible early sepsis.  Zosyn and Flagyl ordered for possible post procedural cholangitis.   1:14 AM Repeat labs ordered as multiple abnormalities rates as a flag for possible error.  4:14 AM Recheck  labs are more consistent with patient's recent labs.  Lactic acid is improved with hydration but is still elevated.  Additional fluids ordered for SBP 95.   5:33 AM Patient receiving fourth liter of normal saline. Vancomycin added to antibiotic regimen.  6:46 AM Levophed drip ordered per protocol.  Intensivist, Dr. Phylliss Bob, at bedside to admit patient.  PROCEDURES   CRITICAL CARE Performed by: Shanon Rosser L Total critical care time: 60 minutes Critical care time was exclusive of separately billable procedures and treating other patients. Critical care was necessary to treat or prevent imminent or life-threatening deterioration. Critical care was time spent personally by me on the following activities: development of treatment plan with patient and/or surrogate as well as nursing, discussions with consultants, evaluation of patient's response to treatment, examination of patient, obtaining history from patient or surrogate, ordering and performing treatments and interventions, ordering and review of laboratory studies, ordering and review of radiographic studies, pulse oximetry and re-evaluation of patient's condition.   ED DIAGNOSES     ICD-10-CM   1. Sepsis associated hypotension (H[facility]) A41.9    I95.9   2. Postoperative infection T81.40XA   3. Septic shock ([place]) A41.9 DG CHEST PORT 1 VIEW   R65.21 DG CHEST PORT 1 VIEW    US Abdomen Limited RUQ    US Abdomen Limited RUQ       Carter Kassel, MD 11/02/17 [phone]

## 2017-11-02 NOTE — ED Notes (Signed)
CHANGED TO HOSPITAL BED. FAMILY AND PT MADE AWARE OF BED ASSIGNMENT AND DELAY WITH ICU BED. VERBALIZED UNDERSTANDING. PT TOLERATED CHANGE. PT ALERT AND ACTIVE WITH CARE.

## 2017-11-02 NOTE — ED Notes (Signed)
Date and time results received: 11/02/17 0116 (use smartphrase ".now" to insert current time)  Test: Calcium Critical Value: 5.0  Name of Provider Notified: Mopus

## 2017-11-02 NOTE — ED Notes (Signed)
ICU MD AT BEDSIDE. FAMILY PRESENT

## 2017-11-02 NOTE — ED Notes (Signed)
CBG 178 

## 2017-11-02 NOTE — ED Notes (Signed)
BOTH BLOOD CULTURES HAVE BEEN COLLECTED FROM PREVIOUS SHIFT

## 2017-11-02 NOTE — ED Notes (Signed)
Warm blankets placed back on pt

## 2017-11-02 NOTE — ED Notes (Signed)
Date and time results received: 11/02/17 0115 (use smartphrase ".now" to insert current time)  Test: Potassium Critical Value: 2.1  Name of Provider Notified: Mopus, MD

## 2017-11-03 ENCOUNTER — Encounter (HOSPITAL_COMMUNITY): Payer: Self-pay

## 2017-11-03 ENCOUNTER — Other Ambulatory Visit: Payer: Self-pay

## 2017-11-03 DIAGNOSIS — E1165 Type 2 diabetes mellitus with hyperglycemia: Secondary | ICD-10-CM

## 2017-11-03 DIAGNOSIS — C23 Malignant neoplasm of gallbladder: Secondary | ICD-10-CM

## 2017-11-03 DIAGNOSIS — I10 Essential (primary) hypertension: Secondary | ICD-10-CM

## 2017-11-03 LAB — CBC WITH DIFFERENTIAL/PLATELET
Basophils Absolute: 0 10*3/uL (ref 0.0–0.1)
Basophils Relative: 0 %
EOS ABS: 0.2 10*3/uL (ref 0.0–0.7)
EOS PCT: 1 %
HCT: 23.1 % — ABNORMAL LOW (ref 36.0–46.0)
Hemoglobin: 7.4 g/dL — ABNORMAL LOW (ref 12.0–15.0)
LYMPHS ABS: 1.1 10*3/uL (ref 0.7–4.0)
Lymphocytes Relative: 6 %
MCH: 29.6 pg (ref 26.0–34.0)
MCHC: 32 g/dL (ref 30.0–36.0)
MCV: 92.4 fL (ref 78.0–100.0)
Monocytes Absolute: 2.1 10*3/uL — ABNORMAL HIGH (ref 0.1–1.0)
Monocytes Relative: 11 %
Neutro Abs: 15 10*3/uL — ABNORMAL HIGH (ref 1.7–7.7)
Neutrophils Relative %: 82 %
Platelets: 323 10*3/uL (ref 150–400)
RBC: 2.5 MIL/uL — AB (ref 3.87–5.11)
RDW: 18.3 % — ABNORMAL HIGH (ref 11.5–15.5)
WBC: 18.4 10*3/uL — AB (ref 4.0–10.5)

## 2017-11-03 LAB — GLUCOSE, CAPILLARY
GLUCOSE-CAPILLARY: 108 mg/dL — AB (ref 65–99)
GLUCOSE-CAPILLARY: 237 mg/dL — AB (ref 65–99)
GLUCOSE-CAPILLARY: 277 mg/dL — AB (ref 65–99)
Glucose-Capillary: 125 mg/dL — ABNORMAL HIGH (ref 65–99)
Glucose-Capillary: 261 mg/dL — ABNORMAL HIGH (ref 65–99)
Glucose-Capillary: 315 mg/dL — ABNORMAL HIGH (ref 65–99)
Glucose-Capillary: 326 mg/dL — ABNORMAL HIGH (ref 65–99)

## 2017-11-03 LAB — MAGNESIUM: Magnesium: 1.7 mg/dL (ref 1.7–2.4)

## 2017-11-03 LAB — COMPREHENSIVE METABOLIC PANEL
ALK PHOS: 384 U/L — AB (ref 38–126)
ALT: 95 U/L — AB (ref 14–54)
ANION GAP: 10 (ref 5–15)
AST: 229 U/L — ABNORMAL HIGH (ref 15–41)
Albumin: 1.5 g/dL — ABNORMAL LOW (ref 3.5–5.0)
BILIRUBIN TOTAL: 4.6 mg/dL — AB (ref 0.3–1.2)
BUN: 27 mg/dL — ABNORMAL HIGH (ref 6–20)
CALCIUM: 8.1 mg/dL — AB (ref 8.9–10.3)
CO2: 19 mmol/L — ABNORMAL LOW (ref 22–32)
CREATININE: 1.32 mg/dL — AB (ref 0.44–1.00)
Chloride: 106 mmol/L (ref 101–111)
GFR calc Af Amer: 49 mL/min — ABNORMAL LOW (ref >60)
GFR calc non Af Amer: 42 mL/min — ABNORMAL LOW (ref >60)
Glucose, Bld: 121 mg/dL — ABNORMAL HIGH (ref 65–99)
Potassium: 3.3 mmol/L — ABNORMAL LOW (ref 3.5–5.1)
SODIUM: 135 mmol/L (ref 135–145)
TOTAL PROTEIN: 5.9 g/dL — AB (ref 6.5–8.1)

## 2017-11-03 LAB — URINE CULTURE: CULTURE: NO GROWTH

## 2017-11-03 LAB — PHOSPHORUS: PHOSPHORUS: 2.4 mg/dL — AB (ref 2.5–4.6)

## 2017-11-03 LAB — PROCALCITONIN: PROCALCITONIN: 37.23 ng/mL

## 2017-11-03 MED ORDER — SODIUM CHLORIDE 0.9% FLUSH
10.0000 mL | Freq: Two times a day (BID) | INTRAVENOUS | Status: DC
  Administered 2017-11-03 – 2017-11-04 (×3): 10 mL

## 2017-11-03 MED ORDER — INSULIN GLARGINE 100 UNIT/ML ~~LOC~~ SOLN
10.0000 [IU] | Freq: Every day | SUBCUTANEOUS | Status: DC
  Administered 2017-11-03 – 2017-11-05 (×3): 10 [IU] via SUBCUTANEOUS
  Filled 2017-11-03 (×3): qty 0.1

## 2017-11-03 MED ORDER — TRAMADOL HCL 50 MG PO TABS
50.0000 mg | ORAL_TABLET | Freq: Two times a day (BID) | ORAL | Status: DC | PRN
  Administered 2017-11-03 – 2017-11-05 (×4): 50 mg via ORAL
  Filled 2017-11-03 (×4): qty 1

## 2017-11-03 MED ORDER — PANTOPRAZOLE SODIUM 40 MG PO TBEC
40.0000 mg | DELAYED_RELEASE_TABLET | Freq: Two times a day (BID) | ORAL | Status: DC
  Administered 2017-11-03 – 2017-11-05 (×5): 40 mg via ORAL
  Filled 2017-11-03 (×5): qty 1

## 2017-11-03 MED ORDER — INSULIN ASPART 100 UNIT/ML ~~LOC~~ SOLN
0.0000 [IU] | Freq: Three times a day (TID) | SUBCUTANEOUS | Status: DC
  Administered 2017-11-03: 5 [IU] via SUBCUTANEOUS
  Administered 2017-11-04: 09:00:00 2 [IU] via SUBCUTANEOUS
  Administered 2017-11-04 (×2): 3 [IU] via SUBCUTANEOUS
  Administered 2017-11-05 (×2): 2 [IU] via SUBCUTANEOUS

## 2017-11-03 MED ORDER — CHLORHEXIDINE GLUCONATE CLOTH 2 % EX PADS
6.0000 | MEDICATED_PAD | Freq: Every day | CUTANEOUS | Status: DC
  Administered 2017-11-03: 6 via TOPICAL

## 2017-11-03 MED ORDER — INSULIN ASPART 100 UNIT/ML ~~LOC~~ SOLN
0.0000 [IU] | Freq: Every day | SUBCUTANEOUS | Status: DC
  Administered 2017-11-03: 3 [IU] via SUBCUTANEOUS
  Administered 2017-11-04: 23:00:00 2 [IU] via SUBCUTANEOUS

## 2017-11-03 MED ORDER — SODIUM CHLORIDE 0.9% FLUSH: 10.0000 mL | INTRAVENOUS | Status: DC | PRN

## 2017-11-03 NOTE — Progress Notes (Signed)
PROGRESS NOTE    Julie Barker  IOE:[id number] DOB: 28-Jun-1956 DOA: 11/01/2017 PCP: Wenda Low, MD    Brief Narrative:  62 year old female who presented with generalized weakness and chills for 24 hours prior to hospitalization.  She does have a significant past medical history of gallbladder adenocarcinoma with liver metastases, type 2 diabetes mellitus, hypertension, coronary artery disease, duodenal ulcers, recent ERCP with stent placement in October 30, 2017.  On her initial physical examination blood pressure 99/55, heart rate 71, temperature 103.9, respiratory rate 24, oxygen saturation 98%.  She was awake and alert, moist mucous membranes, heart S1-S2 present rhythmic, lungs were clear to auscultation bilaterally, no wheezing, rales or rhonchi, the abdomen was soft, tender to palpation epigastric and right upper quadrant area, no rebound or guarding, no lower extremity edema.  Sodium 139, potassium 2.1, chloride 122,, bicarb 11, glucose 175, BUN 18, creatinine 0.85, calcium 5.0, albumin less than 1, white count 5.0, hemoglobin 5.5, hematocrit 17.8, platelets 196, AST 35, ALT 27, total bilirubin 3.7, urine analysis specific gravity 1.017, protein 100, white cells 6-30.  Chest x-ray right rotated, hypoinflated, no infiltrates, EKG sinus rhythm, 84 bpm, normal axis, normal intervals, positive PVCs.  Abdominal ultrasonography showed large mass arising in the gallbladder fossa, biliary duct stent in place with extensive intrahepatic biliary duct dilatation, multiple liver metastasis, pancreatic head mass (3.0 x 2.2 x 3.2 cm).   Patient was admitted to the intensive care unit with working diagnosis of sepsis due to suspected ascending cholangitis   Assessment & Plan:   Principal Problem:   Sepsis associated hypotension ([place]) Active Problems:   Septic shock ([place])  1.  Sepsis due to ascending cholangitis. Patient did not required vasopressors to maintain MAP greater than 65, has  responded well to antibiotic therapy with IV Zosyn and Vancomycin, wbc at 18,4, patient has remained afebrile. Cultures have been no growth. Will hold on Vancomycin for now, continue to follow cell count, cultures and temperature curve. Decrease IV fluids to 50 ml per hour, follow a restrictive IV fluids strategy.   2.  Acute kidney injury. Renal function with serum cr down to 1,32 from 1,59, will continue isotonic saline at 50 ml per hour, no signs of volume overload, urine output 1,500 ml over last 24 hours. Will avoid hypotension or nephrotoxic medications.   3.  Metastatic gallbladder adenocarcinoma. No signs of acute frank obstruction per ultrasonography, positive mass at the gall bladder fossa. Bilirubins down to 4,6 from 6,3 Alkaline phosphatase down to 384. Continue close monitoring.     4. T2Dm. Will continue glucose cover and monitoring with insulin sliding scale, capillary glucose 219, 209, 124, 100, 125. Patient tolerating po well. Holding long acting insulin for now to prevent hypoglycemia.   5. HTN. Stable blood pressure, will continue to hold on amlodipine, isosorbide and carvedilol for now.    DVT prophylaxis: heparin sq  Code Status: full Family Communication: I spoke with patient's daughter and all questions were addressed Disposition Plan: home   Consultants:     Procedures:     Antimicrobials:   Zosyn    Subjective: Patient is feeling better, no dyspnea, no chest pain, no nausea or vomiting. Positive bowel movement, no diarrhea.   Objective: Vitals:   11/03/17 0300 11/03/17 0400 11/03/17 0500 11/03/17 0600  BP: 140/61 140/62 (!) 145/63 140/65  Pulse:      Resp: 18 17 19 17   Temp: 98.2 F (36.8 C) 98.4 F (36.9 C) 98.6 F (37 C) 98.6 F (  37 C)  TempSrc:  Core    SpO2: 100% 100% 100% 100%  Weight:  87.2 kg (192 lb 3.9 oz)    Height:        Intake/Output Summary (Last 24 hours) at 11/03/2017 0800 Last data filed at 11/03/2017 0500 Gross per 24 hour    Intake 3786.25 ml  Output 1500 ml  Net 2286.25 ml   Filed Weights   11/02/17 0004 11/03/17 0400  Weight: 84.4 kg (186 lb) 87.2 kg (192 lb 3.9 oz)    Examination:   General: Not in pain or dyspnea, deconditioned Neurology: Awake and alert, non focal  E ENT: no pallor, positive icterus, oral mucosa moist Cardiovascular: No JVD. S1-S2 present, rhythmic, no gallops, rubs, or murmurs. No lower extremity edema. Pulmonary: vesicular breath sounds bilaterally, adequate air movement, no wheezing, rhonchi or rales. Gastrointestinal. Abdomen protuberant, no organomegaly, non tender, no rebound or guarding Skin. No rashes Musculoskeletal: no joint deformities     Data Reviewed: I have personally reviewed following labs and imaging studies  CBC: Recent Labs  Lab 10/29/17 1004 11/02/17 0031 11/02/17 0114 11/03/17 0347  WBC 13.0* 5.0 10.1 18.4*  NEUTROABS  --  4.8 9.5* 15.0*  HGB 8.8* 5.5* 8.0* 7.4*  HCT 26.3* 17.8* 25.4* 23.1*  MCV 89.2 91.8 92.4 92.4  PLT 419* 196 306 725   Basic Metabolic Panel: Recent Labs  Lab 11/02/17 0031 11/02/17 0114 11/02/17 0840 11/03/17 0441  NA 139 131* 133* 135  K 2.1* 3.8 4.3 3.3*  CL 122* 102 105 106  CO2 11* 17* 15* 19*  GLUCOSE 175* 240* 256* 121*  BUN 18 32* 30* 27*  CREATININE 0.87 1.67* 1.59* 1.32*  CALCIUM 5.0* 8.6* 8.0* 8.1*  MG  --  1.4*  --  1.7  PHOS  --   --   --  2.4*   GFR: Estimated Creatinine Clearance: 48.6 mL/min (A) (by C-G formula based on SCr of 1.32 mg/dL (H)). Liver Function Tests: Recent Labs  Lab 11/02/17 0031 11/02/17 0114 11/03/17 0441  AST 35 63* 229*  ALT 27 48 95*  ALKPHOS 292* 497* 384*  BILITOT 3.7* 6.3* 4.6*  PROT 3.7* 6.4* 5.9*  ALBUMIN <1.0* 1.7* 1.5*   No results for input(s): LIPASE, AMYLASE in the last 168 hours. No results for input(s): AMMONIA in the last 168 hours. Coagulation Profile: Recent Labs  Lab 10/29/17 1004  INR 1.37   Cardiac Enzymes: No results for input(s): CKTOTAL,  CKMB, CKMBINDEX, TROPONINI in the last 168 hours. BNP (last 3 results) No results for input(s): PROBNP in the last 8760 hours. HbA1C: No results for input(s): HGBA1C in the last 72 hours. CBG: Recent Labs  Lab 11/02/17 1645 11/02/17 2009 11/02/17 2349 11/03/17 0330 11/03/17 0745  GLUCAP 219* 209* 124* 108* 125*   Lipid Profile: No results for input(s): CHOL, HDL, LDLCALC, TRIG, CHOLHDL, LDLDIRECT in the last 72 hours. Thyroid Function Tests: No results for input(s): TSH, T4TOTAL, FREET4, T3FREE, THYROIDAB in the last 72 hours. Anemia Panel: No results for input(s): VITAMINB12, FOLATE, FERRITIN, TIBC, IRON, RETICCTPCT in the last 72 hours.    Radiology Studies: I have reviewed all of the imaging during this hospital visit personally     Scheduled Meds: . Chlorhexidine Gluconate Cloth  6 each Topical Daily  . heparin  5,000 Units Subcutaneous Q8H  . insulin aspart  2-6 Units Subcutaneous Q4H  . pantoprazole (PROTONIX) IV  40 mg Intravenous Q24H  . sodium chloride flush  10-40 mL Intracatheter Q12H  Continuous Infusions: . sodium chloride    . sodium chloride 75 mL/hr at 11/03/17 0500  . norepinephrine Stopped (11/02/17 0615)  . piperacillin-tazobactam (ZOSYN)  IV Stopped (11/03/17 0357)  . vancomycin       LOS: 1 day        Tawni Millers, MD [facility] Pager [phone]

## 2017-11-03 NOTE — Progress Notes (Signed)
Pt transferred to 1620.  Pt requested to keep foley catheter in until the morning when she gets up and moves around.  Verified with charge RN and relayed information on to 6 floor RN Denice Paradise.

## 2017-11-04 DIAGNOSIS — T859XXA Unspecified complication of internal prosthetic device, implant and graft, initial encounter: Secondary | ICD-10-CM

## 2017-11-04 LAB — BLOOD CULTURE ID PANEL (REFLEXED)

## 2017-11-04 LAB — CBC WITH DIFFERENTIAL/PLATELET
BASOS ABS: 0 10*3/uL (ref 0.0–0.1)
Basophils Relative: 0 %
Eosinophils Absolute: 0.3 10*3/uL (ref 0.0–0.7)
Eosinophils Relative: 2 %
HEMATOCRIT: 23.8 % — AB (ref 36.0–46.0)
HEMOGLOBIN: 7.5 g/dL — AB (ref 12.0–15.0)
LYMPHS PCT: 8 %
Lymphs Abs: 1.3 10*3/uL (ref 0.7–4.0)
MCH: 29.1 pg (ref 26.0–34.0)
MCHC: 31.5 g/dL (ref 30.0–36.0)
MCV: 92.2 fL (ref 78.0–100.0)
MONO ABS: 1.5 10*3/uL — AB (ref 0.1–1.0)
Monocytes Relative: 9 %
Neutro Abs: 13.1 10*3/uL — ABNORMAL HIGH (ref 1.7–7.7)
Neutrophils Relative %: 81 %
Platelets: 310 10*3/uL (ref 150–400)
RBC: 2.58 MIL/uL — AB (ref 3.87–5.11)
RDW: 18 % — ABNORMAL HIGH (ref 11.5–15.5)
WBC: 16.2 10*3/uL — AB (ref 4.0–10.5)

## 2017-11-04 LAB — BASIC METABOLIC PANEL
ANION GAP: 11 (ref 5–15)
BUN: 22 mg/dL — ABNORMAL HIGH (ref 6–20)
CO2: 20 mmol/L — AB (ref 22–32)
Calcium: 8.4 mg/dL — ABNORMAL LOW (ref 8.9–10.3)
Chloride: 104 mmol/L (ref 101–111)
Creatinine, Ser: 1.27 mg/dL — ABNORMAL HIGH (ref 0.44–1.00)
GFR calc Af Amer: 51 mL/min — ABNORMAL LOW (ref >60)
GFR calc non Af Amer: 44 mL/min — ABNORMAL LOW (ref >60)
GLUCOSE: 193 mg/dL — AB (ref 65–99)
POTASSIUM: 3.6 mmol/L (ref 3.5–5.1)
Sodium: 135 mmol/L (ref 135–145)

## 2017-11-04 LAB — HEPATIC FUNCTION PANEL
ALT: 88 U/L — AB (ref 14–54)
AST: 108 U/L — ABNORMAL HIGH (ref 15–41)
Albumin: 1.5 g/dL — ABNORMAL LOW (ref 3.5–5.0)
Alkaline Phosphatase: 399 U/L — ABNORMAL HIGH (ref 38–126)
BILIRUBIN INDIRECT: 1.8 mg/dL — AB (ref 0.3–0.9)
Bilirubin, Direct: 2.1 mg/dL — ABNORMAL HIGH (ref 0.1–0.5)
Total Bilirubin: 3.9 mg/dL — ABNORMAL HIGH (ref 0.3–1.2)
Total Protein: 6.3 g/dL — ABNORMAL LOW (ref 6.5–8.1)

## 2017-11-04 LAB — GLUCOSE, CAPILLARY
Glucose-Capillary: 180 mg/dL — ABNORMAL HIGH (ref 65–99)
Glucose-Capillary: 222 mg/dL — ABNORMAL HIGH (ref 65–99)
Glucose-Capillary: 228 mg/dL — ABNORMAL HIGH (ref 65–99)
Glucose-Capillary: 236 mg/dL — ABNORMAL HIGH (ref 65–99)

## 2017-11-04 LAB — PROCALCITONIN: Procalcitonin: 21.45 ng/mL

## 2017-11-04 NOTE — Progress Notes (Signed)
PROGRESS NOTE    Julie Barker  KKX:[id number] DOB: 17-Apr-1956 DOA: 11/01/2017 PCP: Wenda Low, MD    Brief Narrative:  62 year old female who presented with generalized weakness and chills for 24 hours prior to hospitalization.  She does have a significant past medical history of gallbladder adenocarcinoma with liver metastases, type 2 diabetes mellitus, hypertension, coronary artery disease, duodenal ulcers, recent ERCP with stent placement in October 30, 2017.  On her initial physical examination blood pressure 99/55, heart rate 71, temperature 103.9, respiratory rate 24, oxygen saturation 98%.  She was awake and alert, moist mucous membranes, heart S1-S2 present rhythmic, lungs were clear to auscultation bilaterally, no wheezing, rales or rhonchi, the abdomen was soft, tender to palpation epigastric and right upper quadrant area, no rebound or guarding, no lower extremity edema.  Sodium 139, potassium 2.1, chloride 122,, bicarb 11, glucose 175, BUN 18, creatinine 0.85, calcium 5.0, albumin less than 1, white count 5.0, hemoglobin 5.5, hematocrit 17.8, platelets 196, AST 35, ALT 27, total bilirubin 3.7, urine analysis specific gravity 1.017, protein 100, white cells 6-30.  Chest x-ray right rotated, hypoinflated, no infiltrates, EKG sinus rhythm, 84 bpm, normal axis, normal intervals, positive PVCs.  Abdominal ultrasonography showed large mass arising in the gallbladder fossa, biliary duct stent in place with extensive intrahepatic biliary duct dilatation, multiple liver metastasis, pancreatic head mass (3.0 x 2.2 x 3.2 cm).   Patient was admitted to the intensive care unit with working diagnosis of sepsis due to suspected ascending cholangitis    Assessment & Plan:   Principal Problem:   Sepsis associated hypotension ([place]) Active Problems:   Septic shock ([place])   1.  Sepsis due to ascending cholangitis. Antibiotic therapy with IV Zosyn, will discontinue IV vancomycin, wbc  trending down to 16, patient has been afebrile, will continue to follow on cultures. Blood cultures have been no growth, only one bottle with gram variable rod, suspected contamination. Patient tolerating po well, continue to improve bilirubins. Case discussed with Dr. Trilby Leaver from GI, will get abdominal films to evaluate pancreatic stent. Continue medical therapy.   2.  Acute kidney injury. Renal function with serum cr down to 1,27 from 1,32, will discontinue isotonic. Follow renal panel in am, avoid hypotension or nephrotoxic medications.   3.  Metastatic gallbladder adenocarcinoma. Continue to improve liver enzymes and bilirubins, case discussed with Dr Trilby Leaver, with recommendations, to continue supportive medical therapy. Follow I with Oncology recommendations.   4. T2Dm. Glucose cover and monitoring with insulin sliding scale, capillary glucose 315, 326, 277, 180, 222. Continue insulin lantus 10 units daily. Patient tolerating po well, no nausea or vomiting.   5. HTN. Holding on amlodipine, isosorbide and carvedilol for now, to prevent hypotension.    DVT prophylaxis: heparin sq  Code Status: full Family Communication: I spoke with patient's daughter and all questions were addressed Disposition Plan: home   Consultants:     Procedures:     Antimicrobials:   Zosyn IV   Subjective: Patient feeling well, no nausea or vomiting, this am no abdominal pain, is tolerating well po diet. No fever or chills.   Objective: Vitals:   11/03/17 1800 11/03/17 2000 11/03/17 2316 11/04/17 0717  BP: 124/61 (!) 144/62 (!) 165/72 115/61  Pulse:  77 67 63  Resp:   18 19  Temp: 99.5 F (37.5 C) 98.9 F (37.2 C) 99.3 F (37.4 C) 99.4 F (37.4 C)  TempSrc:  Oral Oral Oral  SpO2:  100% 100% 98%  Weight:  90.7 kg (200 lb)  Height:        Intake/Output Summary (Last 24 hours) at 11/04/2017 1202 Last data filed at 11/04/2017 0900 Gross per 24 hour  Intake 1260 ml  Output 2451 ml    Net -1191 ml   Filed Weights   11/02/17 0004 11/03/17 0400 11/04/17 0717  Weight: 84.4 kg (186 lb) 87.2 kg (192 lb 3.9 oz) 90.7 kg (200 lb)    Examination:   General: Not in pain or dyspnea  Neurology: Awake and alert, non focal  E ENT: no pallor, improved icterus, oral mucosa moist Cardiovascular: No JVD. S1-S2 present, rhythmic, no gallops, rubs, or murmurs. No lower extremity edema. Pulmonary: vesicular breath sounds bilaterally, adequate air movement, no wheezing, rhonchi or rales. Gastrointestinal. Abdomen with no organomegaly, non tender, no rebound or guarding Skin. No rashes Musculoskeletal: no joint deformities   Data Reviewed: I have personally reviewed following labs and imaging studies  CBC: Recent Labs  Lab 10/29/17 1004 11/02/17 0031 11/02/17 0114 11/03/17 0347 11/04/17 0656  WBC 13.0* 5.0 10.1 18.4* 16.2*  NEUTROABS  --  4.8 9.5* 15.0* 13.1*  HGB 8.8* 5.5* 8.0* 7.4* 7.5*  HCT 26.3* 17.8* 25.4* 23.1* 23.8*  MCV 89.2 91.8 92.4 92.4 92.2  PLT 419* 196 306 323 102   Basic Metabolic Panel: Recent Labs  Lab 11/02/17 0031 11/02/17 0114 11/02/17 0840 11/03/17 0441 11/04/17 0656  NA 139 131* 133* 135 135  K 2.1* 3.8 4.3 3.3* 3.6  CL 122* 102 105 106 104  CO2 11* 17* 15* 19* 20*  GLUCOSE 175* 240* 256* 121* 193*  BUN 18 32* 30* 27* 22*  CREATININE 0.87 1.67* 1.59* 1.32* 1.27*  CALCIUM 5.0* 8.6* 8.0* 8.1* 8.4*  MG  --  1.4*  --  1.7  --   PHOS  --   --   --  2.4*  --    GFR: Estimated Creatinine Clearance: 51.5 mL/min (A) (by C-G formula based on SCr of 1.27 mg/dL (H)). Liver Function Tests: Recent Labs  Lab 11/02/17 0031 11/02/17 0114 11/03/17 0441 11/04/17 0656  AST 35 63* 229* 108*  ALT 27 48 95* 88*  ALKPHOS 292* 497* 384* 399*  BILITOT 3.7* 6.3* 4.6* 3.9*  PROT 3.7* 6.4* 5.9* 6.3*  ALBUMIN <1.0* 1.7* 1.5* 1.5*   No results for input(s): LIPASE, AMYLASE in the last 168 hours. No results for input(s): AMMONIA in the last 168  hours. Coagulation Profile: Recent Labs  Lab 10/29/17 1004  INR 1.37   Cardiac Enzymes: No results for input(s): CKTOTAL, CKMB, CKMBINDEX, TROPONINI in the last 168 hours. BNP (last 3 results) No results for input(s): PROBNP in the last 8760 hours. HbA1C: No results for input(s): HGBA1C in the last 72 hours. CBG: Recent Labs  Lab 11/03/17 1824 11/03/17 1949 11/03/17 2154 11/04/17 0729 11/04/17 1156  GLUCAP 315* 326* 277* 180* 222*   Lipid Profile: No results for input(s): CHOL, HDL, LDLCALC, TRIG, CHOLHDL, LDLDIRECT in the last 72 hours. Thyroid Function Tests: No results for input(s): TSH, T4TOTAL, FREET4, T3FREE, THYROIDAB in the last 72 hours. Anemia Panel: No results for input(s): VITAMINB12, FOLATE, FERRITIN, TIBC, IRON, RETICCTPCT in the last 72 hours.    Radiology Studies: I have reviewed all of the imaging during this hospital visit personally     Scheduled Meds: . Chlorhexidine Gluconate Cloth  6 each Topical Daily  . heparin  5,000 Units Subcutaneous Q8H  . insulin aspart  0-5 Units Subcutaneous QHS  . insulin aspart  0-9 Units Subcutaneous TID WC  . insulin glargine  10 Units Subcutaneous Daily  . pantoprazole  40 mg Oral BID  . sodium chloride flush  10-40 mL Intracatheter Q12H   Continuous Infusions: . sodium chloride 50 mL/hr at 11/04/17 0612  . piperacillin-tazobactam (ZOSYN)  IV 3.375 g (11/04/17 0858)     LOS: 2 days        Irianna Gilday Gerome Apley, MD [facility] Pager [phone]

## 2017-11-04 NOTE — Progress Notes (Signed)
PHARMACY - PHYSICIAN COMMUNICATION CRITICAL VALUE ALERT - BLOOD CULTURE IDENTIFICATION (BCID)  Julie Barker is an 62 y.o. female who presented to [facility] on 11/01/2017 with a chief complaint of sepsis  Assessment:  Septic, intra-abdominal source, 1 anaerobic bottle growing gram variable rods  Name of physician (or Provider) Contacted: Dr. Cathlean Sauer via text page  Current antibiotics: Vancomycin and Zosyn  Changes to prescribed antibiotics recommended:  None, continue empiric broad coverage for now  Results for orders placed or performed during the hospital encounter of 11/01/17  Blood Culture ID Panel (Reflexed) (Collected: 11/02/2017 12:31 AM)  Result Value Ref Range   Enterococcus species NOT DETECTED NOT DETECTED   Listeria monocytogenes NOT DETECTED NOT DETECTED   Staphylococcus species NOT DETECTED NOT DETECTED   Staphylococcus aureus NOT DETECTED NOT DETECTED   Streptococcus species NOT DETECTED NOT DETECTED   Streptococcus agalactiae NOT DETECTED NOT DETECTED   Streptococcus pneumoniae NOT DETECTED NOT DETECTED   Streptococcus pyogenes NOT DETECTED NOT DETECTED   Acinetobacter baumannii NOT DETECTED NOT DETECTED   Enterobacteriaceae species NOT DETECTED NOT DETECTED   Enterobacter cloacae complex NOT DETECTED NOT DETECTED   Escherichia coli NOT DETECTED NOT DETECTED   Klebsiella oxytoca NOT DETECTED NOT DETECTED   Klebsiella pneumoniae NOT DETECTED NOT DETECTED   Proteus species NOT DETECTED NOT DETECTED   Serratia marcescens NOT DETECTED NOT DETECTED   Haemophilus influenzae NOT DETECTED NOT DETECTED   Neisseria meningitidis NOT DETECTED NOT DETECTED   Pseudomonas aeruginosa NOT DETECTED NOT DETECTED   Candida albicans NOT DETECTED NOT DETECTED   Candida glabrata NOT DETECTED NOT DETECTED   Candida krusei NOT DETECTED NOT DETECTED   Candida parapsilosis NOT DETECTED NOT DETECTED   Candida tropicalis NOT DETECTED NOT DETECTED    Hershal Coria 11/04/2017  3:37  PM

## 2017-11-04 NOTE — Evaluation (Signed)
Physical Therapy Evaluation Patient Details Name: Julie Barker MRN: [id number] DOB: [birth year] Today's Date: 11/04/2017   History of Present Illness  40yoF with hx Gallbladder adenocarcinoma with liver mets, DM, HTN, CAD, Duodenal ulcer (diagnosed 10/18/17), recent port placement (10/29/17), and recent ERCP with biliary stent placement (10/30/17), now presents to the ER with generalized weakness and chills. Dx septic shock.  Clinical Impression  Pt is independent with mobility, no further PT indicated. Encouraged pt to ambulate in halls at least TID to minimize deconditioning. She is ready to DC home from PT standpoint. PT signing off.      Follow Up Recommendations No PT follow up    Equipment Recommendations  None recommended by PT    Recommendations for Other Services       Precautions / Restrictions Precautions Precautions: None Restrictions Weight Bearing Restrictions: No      Mobility  Bed Mobility               General bed mobility comments: up in chair  Transfers Overall transfer level: Independent                  Ambulation/Gait Ambulation/Gait assistance: Independent Ambulation Distance (Feet): 140 Feet Assistive device: None Gait Pattern/deviations: WFL(Within Functional Limits)     General Gait Details: steady without assistive device  Stairs            Wheelchair Mobility    Modified Rankin (Stroke Patients Only)       Balance Overall balance assessment: Independent                                           Pertinent Vitals/Pain Pain Assessment: No/denies pain    Home Living Family/patient expects to be discharged to:: Private residence Living Arrangements: Spouse/significant other Available Help at Discharge: Family;Available 24 hours/day Type of Home: House         Home Equipment: Walker - 2 wheels;Bedside commode      Prior Function Level of Independence: Independent                Hand Dominance        Extremity/Trunk Assessment   Upper Extremity Assessment Upper Extremity Assessment: Overall WFL for tasks assessed    Lower Extremity Assessment Lower Extremity Assessment: Overall WFL for tasks assessed    Cervical / Trunk Assessment Cervical / Trunk Assessment: Normal  Communication   Communication: No difficulties  Cognition Arousal/Alertness: Awake/alert Behavior During Therapy: WFL for tasks assessed/performed Overall Cognitive Status: Within Functional Limits for tasks assessed                                        General Comments      Exercises     Assessment/Plan    PT Assessment Patent does not need any further PT services  PT Problem List         PT Treatment Interventions      PT Goals (Current goals can be found in the Care Plan section)  Acute Rehab PT Goals Patient Stated Goal: likes to shop PT Goal Formulation: All assessment and education complete, DC therapy    Frequency     Barriers to discharge        Co-evaluation  AM-PAC PT "6 Clicks" Daily Activity  Outcome Measure Difficulty turning over in bed (including adjusting bedclothes, sheets and blankets)?: None Difficulty moving from lying on back to sitting on the side of the bed? : None Difficulty sitting down on and standing up from a chair with arms (e.g., wheelchair, bedside commode, etc,.)?: None Help needed moving to and from a bed to chair (including a wheelchair)?: None Help needed walking in hospital room?: None Help needed climbing 3-5 steps with a railing? : None 6 Click Score: 24    End of Session   Activity Tolerance: Patient tolerated treatment well Patient left: in chair;with call bell/phone within reach;with family/visitor present Nurse Communication: Mobility status      Time: 6060-0459 PT Time Calculation (min) (ACUTE ONLY): 9 min   Charges:   PT Evaluation $PT Eval Low Complexity: 1 Low      PT G Codes:          Julie Barker 11/04/2017, 2:42 PM [phone]

## 2017-11-04 NOTE — Progress Notes (Signed)
Pt experiencing minor nose bleed & able to stop it by herself applying pressure to side of nose. Dr Cathlean Sauer notified by phone, acknowledged info & said to continue to monitor for any more nose bleeds. Pheobe Sandiford, [employer]

## 2017-11-05 ENCOUNTER — Telehealth: Payer: Self-pay

## 2017-11-05 ENCOUNTER — Inpatient Hospital Stay (HOSPITAL_COMMUNITY): Payer: BLUE CROSS/BLUE SHIELD

## 2017-11-05 DIAGNOSIS — D500 Iron deficiency anemia secondary to blood loss (chronic): Secondary | ICD-10-CM

## 2017-11-05 DIAGNOSIS — C787 Secondary malignant neoplasm of liver and intrahepatic bile duct: Secondary | ICD-10-CM

## 2017-11-05 DIAGNOSIS — K922 Gastrointestinal hemorrhage, unspecified: Secondary | ICD-10-CM

## 2017-11-05 DIAGNOSIS — T8140XA Infection following a procedure, unspecified, initial encounter: Secondary | ICD-10-CM

## 2017-11-05 DIAGNOSIS — I959 Hypotension, unspecified: Secondary | ICD-10-CM

## 2017-11-05 LAB — CBC
HCT: 24.7 % — ABNORMAL LOW (ref 36.0–46.0)
HEMOGLOBIN: 7.8 g/dL — AB (ref 12.0–15.0)
MCH: 29 pg (ref 26.0–34.0)
MCHC: 31.6 g/dL (ref 30.0–36.0)
MCV: 91.8 fL (ref 78.0–100.0)
Platelets: 311 10*3/uL (ref 150–400)
RBC: 2.69 MIL/uL — ABNORMAL LOW (ref 3.87–5.11)
RDW: 17.8 % — AB (ref 11.5–15.5)
WBC: 14.8 10*3/uL — ABNORMAL HIGH (ref 4.0–10.5)

## 2017-11-05 LAB — COMPREHENSIVE METABOLIC PANEL
ALBUMIN: 1.7 g/dL — AB (ref 3.5–5.0)
ALK PHOS: 452 U/L — AB (ref 38–126)
ALT: 65 U/L — AB (ref 14–54)
ANION GAP: 10 (ref 5–15)
AST: 54 U/L — AB (ref 15–41)
BUN: 18 mg/dL (ref 6–20)
CO2: 22 mmol/L (ref 22–32)
CREATININE: 1.27 mg/dL — AB (ref 0.44–1.00)
Calcium: 8.6 mg/dL — ABNORMAL LOW (ref 8.9–10.3)
Chloride: 103 mmol/L (ref 101–111)
GFR calc Af Amer: 51 mL/min — ABNORMAL LOW (ref >60)
GFR calc non Af Amer: 44 mL/min — ABNORMAL LOW (ref >60)
GLUCOSE: 214 mg/dL — AB (ref 65–99)
Potassium: 3.7 mmol/L (ref 3.5–5.1)
SODIUM: 135 mmol/L (ref 135–145)
Total Bilirubin: 4 mg/dL — ABNORMAL HIGH (ref 0.3–1.2)
Total Protein: 6.4 g/dL — ABNORMAL LOW (ref 6.5–8.1)

## 2017-11-05 LAB — GLUCOSE, CAPILLARY
Glucose-Capillary: 176 mg/dL — ABNORMAL HIGH (ref 65–99)
Glucose-Capillary: 194 mg/dL — ABNORMAL HIGH (ref 65–99)

## 2017-11-05 MED ORDER — CIPROFLOXACIN HCL 250 MG PO TABS: 250.0000 mg | ORAL_TABLET | Freq: Two times a day (BID) | ORAL | 0 refills | Status: DC

## 2017-11-05 MED ORDER — HEPARIN SOD (PORK) LOCK FLUSH 100 UNIT/ML IV SOLN
500.0000 [IU] | INTRAVENOUS | Status: AC | PRN
  Administered 2017-11-05: 500 [IU]

## 2017-11-05 NOTE — Telephone Encounter (Signed)
Spoke with pt and daughter regarding plan for discharge from hospital. Daughter stated "the hospitalist reviewed the positive culture and said it was a contaminant, not infection, that doesn't require treatment. They are going to discharge her today if labs look good and give her something orally to take. Is Dr. Benay Spice agreeable with this plan"?. Per Dr. Benay Spice, he is agreeable. Also informed pt and daughter of change in appts from tomorrow to next week due to this recent hospitalization. Pt and daughter voiced understanding. Will call back with any further questions/concerns.

## 2017-11-05 NOTE — Progress Notes (Addendum)
Discussed case with hospital team yesterday and no new chemistries today and liver tests have come down nicely and white count trending down and x-ray today does not show any evidence of pancreatic stent which is our reason for doing it to make sure it passed and I would repeat lab work before discharge and as long as oncology is okay with minimal elevated liver tests for chemotherapy I do not think she needs any further duct drainage and please call if I can be of any further assistance    and as an aside patient was seen and examined and doing well and in good spirits and her abdomen is soft nontender and we discussed the above and Dr. Benay Spice to start chemotherapy soon once infection is treated

## 2017-11-05 NOTE — Progress Notes (Signed)
IP PROGRESS NOTE  Subjective:   Julie Barker was admitted 11/02/2017 with sepsis.  She was placed on IV antibiotics.  She reports feeling much better.  She continues to have intermittent abdominal pain.  One blood culture from admission is growing a gram variable rod. Jaundice has improved.  Objective: Vital signs in last 24 hours: Blood pressure (!) 159/70, pulse 68, temperature 99 F (37.2 C), temperature source Oral, resp. rate 15, height 5\' 6"  (1.676 m), weight 207 lb 10.8 oz (94.2 kg), SpO2 99 %.  Intake/Output from previous day: 02/24 0701 - 02/25 0700 In: 1480 [P.O.:1330; IV Piggyback:150] Out: 601 [Urine:600; Stool:1]  Physical Exam:  HEENT: Mild scleral icterus Lungs: Clear bilaterally, no respiratory distress Cardiac: Regular rate and rhythm Abdomen:, Fullness in the right upper abdomen, the abdomen is soft Extremities: No leg edema   Portacath/PICC-without erythema  Lab Results: Recent Labs    11/04/17 0656 11/05/17 1329  WBC 16.2* 14.8*  HGB 7.5* 7.8*  HCT 23.8* 24.7*  PLT 310 311    BMET Recent Labs    11/03/17 0441 11/04/17 0656  NA 135 135  K 3.3* 3.6  CL 106 104  CO2 19* 20*  GLUCOSE 121* 193*  BUN 27* 22*  CREATININE 1.32* 1.27*  CALCIUM 8.1* 8.4*    Lab Results  Component Value Date   CEA1 8.4 (H) 10/18/2017    Studies/Results: Dg Abd 2 Views  Result Date: 11/05/2017 CLINICAL DATA:  Continued abdominal pain. Recent ERCP and stent placement a week ago. EXAM: ABDOMEN - 2 VIEW COMPARISON:  CT abdomen pelvis dated October 26, 2017. FINDINGS: Common bile duct stent is grossly unchanged in position. The bowel gas pattern is normal. There is no evidence of free air. Calcified fibroids in the pelvis. Multiple large left renal calculi are again noted. Atherosclerotic vascular calcification with infrarenal abdominal aortic aneurysm again noted. No acute osseous abnormality. IMPRESSION: 1. No definite acute abnormality. Common bile duct stent is  grossly unchanged in position. 2. Unchanged left nephrolithiasis. Electronically Signed   By: Titus Dubin M.D.   On: 11/05/2017 09:38    Medications: I have reviewed the patient's current medications.  Assessment/Plan: 1.Metastatic adenocarcinoma, biopsy of a segment 5 liver mass 09/21/2017-adenocarcinoma  CT abdomen/pelvis 08/17/2017-gallbladder fossa mass, upper abdominal lymphadenopathy, liver metastases  MRI of the liver 10/14/2017-masslike appearance of the gallbladder with direct invasion of the adjacent liver, multiple hepatic metastases, upper abdominal nodal metastases  CT 10/26/2017- multiple liver metastases, intrahepatic biliary dilatation secondary to gallbladder mass and porta hepatis adenopathy  2.Severe anemia secondary to GI bleeding  Duodenal bulb ulcer noted on endoscopy 10/18/2017, biopsied-inflammation/necrosis consistent with ulceration, focal atypical epithelioid cells of unknown significance   3.Anorexia/weight loss  4.Diabetes  5.Uterine fibroids  6.  Biliary obstruction secondary to the gallbladder tumor/adenopathy-status post ERCP and placement of a right hepatic duct stent 10/30/2017  7.   Admission with sepsis syndrome 11/02/2017   Julie Barker has metastatic adenocarcinoma, likely of gallbladder origin.  She was admitted with sepsis syndrome 11/02/2017.  Her clinical status has improved over the past several days.  She is scheduled to begin FOLFOX chemotherapy at the Cancer center tomorrow.  We will delay the initiation of chemotherapy until she has recovered from the episode of sepsis and the bilirubin has further improved.  Recommendations: 1.  Follow-up ID/sensitivity on the positive blood culture, antibiotics per the medical service 2.  Check chemistry panel 11/06/2017 3.  Outpatient follow-up for cycle 1 FOLFOX will be scheduled at the  Cancer center for the week of 11/12/2017      LOS: 3 days   Betsy Coder, MD   11/05/2017, 1:56  PM

## 2017-11-05 NOTE — Discharge Summary (Signed)
Physician Discharge Summary  [name]  ZHG:[id number]  DOB: [birth year]  DOA: 11/01/2017 PCP: Wenda Low, MD  Admit date: 11/01/2017 Discharge date: 11/05/2017  Admitted From: Home  Disposition:  Home   Recommendations for Outpatient Follow-up:  1. Follow up with PCP in 1 weeks 2. Follow up LFTs in 1 week  3. Follow up with Oncology on 11/12/17 4. Complete antibiotic therapy for 7 day - Cipro 5. Final blood cultures results pending  Discharge Condition: Stable  CODE STATUS: Full Code  Diet recommendation: Heart Healthy   Brief/Interim Summary: For full details see H&P/Progress note, but in brief, Julie Barker is a 62 year old female with history of gallbladder adenocarcinoma with liver metastases, type 2 diabetes mellitus, hypertension, CAD, recent ERCP with stent placement on October 30, 2017.  Patient presented to the emergency department complaining of generalized weakness, and chills.  Upon ED evaluation she was found to have elevated temperature, low blood pressure and abdominal ultrasound showed large mass arising in the gallbladder fossa, biliary stent in place with extensive intrahepatic biliary duct dilation and multiple liver metastases, also pancreatic head mass noted.  Patient was admitted to the ICU with working diagnosis of sepsis due to suspected ascending cholangitis.  Patient was started on empiric antibiotics and GI was consulted, recommending to continue to monitor as stent is in place and with no signs of issues.  Liver enzymes were found to be elevated, which has been trending down.  Oncology was consulted as patient was planned to start chemotherapy, but this was  postponed for when infection has cleared. Patient has clinically improved and patient has been cleared for discharge.   Subjective: Patient seen and examined, she continues to improved, walking around the hall, tolerating diet well, minimal abdominal pain that is relieve by pain medication    Discharge Diagnoses/Hospital Course:  Sepsis felt to be related to ascending cholangitis  Patient was treated with Vanc and Zosyn. Her WBC has trended down.  Blood cultures showed 1/3 bottles with gram variable rods, suspect contaminant, BCID negative.  Patient tolerated p.o. well, bilirubin continues to improve.  Abdominal films with no acute abnormalities and GI recommending to follow-up as an outpatient.  We will continue ciprofloxacin for abdominal coverage to complete 7 days of antibiotics.  Follow-up final blood cultures.  Follow-up with PCP in 1 week  Acute renal failure Creatinine has been stable  1.27 Follow-up BMP in 1 week  Metastatic gallbladder adenocarcinoma Liver enzymes continue to improve, case discussed with Dr. Ammie Dalton who recommended to continue antibiotic therapy for now, chemotherapy will be postponed to start after completion of antibiotics.  Follow-up with oncology in 1 week  Type 2 diabetes mellitus Resume home medications without any further changes  Hypertension Resume BP medications with no changes  All other chronic medical condition were stable during the hospitalization.  Patient was seen by physical therapy, recommending no further follow-up On the day of the discharge the patient's vitals were stable, and no other acute medical condition were reported by patient. the patient was felt safe to be discharge to home.   Discharge Instructions  You were cared for by a hospitalist during your hospital stay. If you have any questions about your discharge medications or the care you received while you were in the hospital after you are discharged, you can call the unit and asked to speak with the hospitalist on call if the hospitalist that took care of you is not available. Once you are discharged, your primary care  physician will handle any further medical issues. Please note that NO REFILLS for any discharge medications will be authorized once you are discharged,  as it is imperative that you return to your primary care physician (or establish a relationship with a primary care physician if you do not have one) for your aftercare needs so that they can reassess your need for medications and monitor your lab values.  Discharge Instructions    Call MD for:  difficulty breathing, headache or visual disturbances   Complete by:  As directed    Call MD for:  extreme fatigue   Complete by:  As directed    Call MD for:  hives   Complete by:  As directed    Call MD for:  persistant dizziness or light-headedness   Complete by:  As directed    Call MD for:  persistant nausea and vomiting   Complete by:  As directed    Call MD for:  redness, tenderness, or signs of infection (pain, swelling, redness, odor or green/yellow discharge around incision site)   Complete by:  As directed    Call MD for:  severe uncontrolled pain   Complete by:  As directed    Call MD for:  temperature >100.4   Complete by:  As directed    Diet - low sodium heart healthy   Complete by:  As directed    Discharge instructions   Complete by:  As directed    Follow up with PCP in 1 weeks Follow up LFTs in 1 week  Follow up with Oncology on 11/12/17 Complete antibiotic therapy for 7 day - Cipro   Increase activity slowly   Complete by:  As directed      Allergies as of 11/05/2017   No Known Allergies     Medication List    STOP taking these medications   acetaminophen 500 MG tablet Commonly known as:  TYLENOL   nitrofurantoin (macrocrystal-monohydrate) 100 MG capsule Commonly known as:  MACROBID   protein supplement Powd Commonly known as:  UNJURY CHICKEN SOUP     TAKE these medications   amLODipine 10 MG tablet Commonly known as:  NORVASC Take 10 mg by mouth every morning.   carvedilol 25 MG tablet Commonly known as:  COREG TAKE ONE TABLET BY MOUTH TWICE DAILY WITH MEALS What changed:    how much to take  how to take this  when to take this   ciprofloxacin  250 MG tablet Commonly known as:  CIPRO Take 1 tablet (250 mg total) by mouth 2 (two) times daily.   insulin glargine 100 UNIT/ML injection Commonly known as:  LANTUS Inject 25 Units into the skin 2 (two) times daily.   isosorbide mononitrate 60 MG 24 hr tablet Commonly known as:  IMDUR Take 60 mg by mouth every morning.   latanoprost 0.005 % ophthalmic solution Commonly known as:  XALATAN Place 1 drop into both eyes at bedtime.   lidocaine-prilocaine cream Commonly known as:  EMLA Apply 1hr prior to port use. Do not rub in. Cover with plastic wrap.   NOVOLOG FLEXPEN 100 UNIT/ML FlexPen Generic drug:  insulin aspart Inject 2-10 Units into the skin 2 (two) times daily. Per sliding scale   ondansetron 4 MG disintegrating tablet Commonly known as:  ZOFRAN-ODT Take 4 mg by mouth every 6 (six) hours as needed for nausea or vomiting.   ondansetron 8 MG tablet Commonly known as:  ZOFRAN Take 1 tablet (8 mg total) by mouth  every 8 (eight) hours as needed for nausea or vomiting.   pantoprazole 40 MG tablet Commonly known as:  PROTONIX Take 1 tablet (40 mg total) by mouth 2 (two) times daily before a meal.   polyethylene glycol packet Commonly known as:  MIRALAX / GLYCOLAX Take 17 g by mouth daily.   prochlorperazine 10 MG tablet Commonly known as:  COMPAZINE Take 1 tablet (10 mg total) by mouth every 6 (six) hours as needed for nausea or vomiting.   traMADol 50 MG tablet Commonly known as:  ULTRAM Take 1 tablet (50 mg total) by mouth every 12 (twelve) hours as needed for moderate pain or severe pain.   ursodiol 300 MG capsule Commonly known as:  ACTIGALL Take 1 capsule (300 mg total) by mouth 3 (three) times daily.      Follow-up Information    Wenda Low, MD. Schedule an appointment as soon as possible for a visit in 1 week(s).   Specialty:  Internal Medicine Why:  Hospital follow-up Contact information: [address] [place] [address] [place]  [address] [phone]          No Known Allergies  Consultations:  GI  Oncology  Procedures/Studies: Dg Chest 2 View  Result Date: 10/17/2017 CLINICAL DATA:  Mid chest pain and shortness of breath, history of liver metastatic disease EXAM: CHEST  2 VIEW COMPARISON:  None. FINDINGS: The heart size and mediastinal contours are within normal limits. Both lungs are clear. The visualized skeletal structures are unremarkable. IMPRESSION: No active cardiopulmonary disease. Electronically Signed   By: Inez Catalina M.D.   On: 10/17/2017 11:48   Mr Abdomen Wwo Contrast  Addendum Date: 10/22/2017   ADDENDUM REPORT: 10/22/2017 15:05 ADDENDUM: The large gallbladder mass invading the liver results in mild central intrahepatic ductal dilatation, left greater than right. This narrows at the biliary confluence (series 3/image 19). The extrahepatic common duct is not dilated, likely secondary to mass effect from the gallbladder/liver mass and large upper abdominal lymph nodes. Electronically Signed   By: Julian Hy M.D.   On: 10/22/2017 15:05   Result Date: 10/22/2017 CLINICAL DATA:  Liver mass, with biopsy-proven adenocarcinoma EXAM: MRI ABDOMEN WITHOUT AND WITH CONTRAST TECHNIQUE: Multiplanar multisequence MR imaging of the abdomen was performed both before and after the administration of intravenous contrast. CONTRAST:  67mL MULTIHANCE GADOBENATE DIMEGLUMINE 529 MG/ML IV SOLN COMPARISON:  CT abdomen/pelvis dated 08/17/2017 FINDINGS: Lower chest: Lung bases are clear. Hepatobiliary: Abnormal, mass-like appearance of the gallbladder (series 8/image 26), which directly invades into the adjacent hepatic parenchyma along the gallbladder fossa, suggesting gallbladder adenocarcinoma. Tumor is inseparable between the gallbladder and liver. Adjacent multifocal hepatic metastases which have progressed from prior CT, including: --2.1 cm lesion in segment 4B (series 15/image 42), previously 1.3 cm --1.6 cm lesion in  segment 4B adjacent to the falciform ligament (series 15/image 53), possibly new --1.8 cm subcapsular lesion in segment 5 (series 15/image 60), previously 1.6 cm --2.1 cm lesion posteriorly in segment 5 (series 15/image 62), difficult to discretely visualized on CT Pancreas:  Within normal limits. Spleen:  Within normal limits. Adrenals/Urinary Tract:  Adrenal glands are within normal limits. Severe right renal scarring/atrophy. Multiple bilateral renal cysts. No hydronephrosis. Stomach/Bowel: Stomach is within normal limits. Visualized bowel is grossly unremarkable. Vascular/Lymphatic:  No evidence of abdominal aortic aneurysm. Large nodal metastases in the upper abdomen, progressed from recent CT, including: --3.1 cm short axis node in the porta pedis (series 8/image 16) --2.5 cm short axis peripancreatic node (series  8/image 18), previously 1.4 cm --2.7 cm short axis portacaval node (series 8/image 20), previously 2.1 cm Other:  No abdominal ascites. Musculoskeletal: No focal osseous lesions. IMPRESSION: Suspected gallbladder adenocarcinoma with direct invasion of the adjacent liver. Multifocal hepatic metastases in both lobes, progressed from recent CT. Progression of upper abdominal nodal metastases. Electronically Signed: By: Julian Hy M.D. On: 10/14/2017 11:06   US Abdomen Complete  Result Date: 11/02/2017 CLINICAL DATA:  Gallbladder carcinoma with liver metastases EXAM: ABDOMEN ULTRASOUND COMPLETE COMPARISON:  CT abdomen and pelvis October 26, 2017 FINDINGS: Gallbladder: There is a mass in the gallbladder fossa region measuring approximately 10 x 9 x 8 cm. There are areas of shadowing from this area, likely gallstones in trapped within the gallbladder mass. No pericholecystic fluid. Common bile duct: Diameter: 10 mm. Note that there is a stent within the biliary ductal system. Liver: There is diffuse intrahepatic biliary duct dilatation. Multiple liver masses are noted throughout the liver  consistent with widespread metastatic disease. Largest individual mass measures approximately 5 x 3 cm. Portal vein is patent on color Doppler imaging with normal direction of blood flow towards the liver. IVC: No abnormality visualized. Pancreas: There is a mass at the head of the pancreas region measuring 3.0 x 2.2 x 3.2 cm. Spleen: Size and appearance within normal limits. Right Kidney: Length: 11.2 cm. Echogenicity is increased. There is relative renal cortical thinning. No mass or hydronephrosis visualized. Left Kidney: Length: 14.7 cm. Echogenicity within normal limits. No mass or hydronephrosis visualized. There is a calculus in the lower pole left kidney measuring 1.9 cm in length. There is trace perinephric fluid on the left. Abdominal aorta: No aneurysm visualized. Other findings: None. IMPRESSION: 1. Large mass arising in the gallbladder fossa felt to represent gallbladder carcinoma. Calculi are noted within this mass. 2. Biliary duct stent in place. Extensive intrahepatic biliary duct dilatation present. 3. Multiple liver masses consistent with metastases scattered throughout the liver. 4. Mass at level of head of pancreas measuring 3.0 x 2.2 x 3.2 cm. Question adenopathy versus mass arising from the pancreatic head. 5. Right kidney shows increased echogenicity and decrease in cortical thickness, likely due to a degree of atrophy. No obstructing focus right kidney. 6. Nonobstructing 1.9 cm calculus lower pole left kidney. Minimal perinephric fluid on the left. No obstructing focus left kidney. Electronically Signed   By: Lowella Grip III M.D.   On: 11/02/2017 08:11   Ct Abdomen Pelvis W Contrast  Result Date: 10/26/2017 CLINICAL DATA:  Metastatic adenocarcinoma. Gallbladder cancer. Evaluate for bile duct obstruction. EXAM: CT ABDOMEN AND PELVIS WITH CONTRAST TECHNIQUE: Multidetector CT imaging of the abdomen and pelvis was performed using the standard protocol following bolus administration of  intravenous contrast. CONTRAST:  60mL ISOVUE-300 IOPAMIDOL (ISOVUE-300) INJECTION 61% COMPARISON:  10/14/2017 and 08/17/2017 FINDINGS: Lower chest: No acute abnormality. Aortic atherosclerosis identified. Atherosclerotic calcifications within the RCA, LAD and left circumflex coronary arteries noted. Hepatobiliary: Multiple liver masses identified. The dominant mass involving segment 5, presumably arising from the gallbladder, measures 8.4 cm, image 44 of series 2. Unchanged from previous exam. The other lesions also similar. Moderate to marked intrahepatic bile duct dilatation involves both lobes of liver. Obstruction may be due to bulky porta hepatic adenopathy and/or gallbladder mass. The common bile duct is not visualized. Pancreas: Unremarkable. No pancreatic ductal dilatation or surrounding inflammatory changes. Spleen: Normal in size without focal abnormality. Adrenals/Urinary Tract: Normal appearance of the adrenal glands. Asymmetric atrophy of the right kidney. There is extensive  scarring involving the right kidney. Mild scarring but no significant volume loss from the left kidney. Large stones identified within the right renal pelvis and lower pole collecting system of the left kidney. The largest measures 1.7 cm. Urinary bladder negative. Stomach/Bowel: The stomach is normal. Bulky porta hepatic adenopathy has mass effect and may involve the descending portion of the duodenum. No gastric outlet obstruction identified at this time. Small bowel loops have a normal course and caliber. The appendix is visualized and appears normal. No pathologic dilatation of the colon. Vascular/Lymphatic: Aortic atherosclerosis. Infrarenal abdominal aortic aneurysm measures 3.8 cm. Tumor and adenopathy exhibits mass effect upon the portal vein. The branch to the left lobe is markedly diminished in caliber, image 27 of series 2. The main portal vein is also significantly diminished due to mass effect, image 29 of series 2.  Bulky periaortic adenopathy identified. Index node has a short axis of 3.1 cm, image 27 of series 7. Peripancreatic node measures 2.8 cm, image 34 of series 2. No enlarged pelvic or inguinal lymph nodes. Reproductive: Large partially calcified fibroid uterus is identified. This measures 14.6 by 15.1 by 15.6 cm (volume = 1800 cm^3). No adnexal mass visualized. Other: No ascites or focal fluid collections identified. There is a fat containing umbilical hernia measuring 3.4 cm. Musculoskeletal: There is no aggressive lytic or sclerotic bone lesions identified. IMPRESSION: 1. Again noted are multifocal liver metastasis. Large mass centered around the gallbladder and involving segment 5 of the liver may represent primary gallbladder cancer. 2. Moderate to marked intrahepatic biliary dilatation likely reflecting mass-effect upon the central bile ducts and common bile duct by gallbladder mass and bulky porta hepatic adenopathy. New from 08/17/2017. 3. Tumor exhibit significant mass effect and narrows portions of the portal vein without evidence for thrombosis. 4. Upper abdominal adenopathy as before. 5. Large partially calcified fibroid uterus with a volume of approximately 1800 cc. 6. Aortic atherosclerosis, infrarenal abdominal aortic aneurysm, and 3 vessel coronary artery atherosclerotic calcifications. 7. Aortic aneurysm NOS (ICD10-I71.9). Recommend followup by ultrasound in 2 years. This recommendation follows ACR consensus guidelines: White Paper of the ACR Incidental Findings Committee II on Vascular Findings. J Am Coll Radiol 2013; 10:789-794. 8. Aortic Atherosclerosis (ICD10-I70.0) and Emphysema (ICD10-J43.9). Electronically Signed   By: Kerby Moors M.D.   On: 10/26/2017 09:29   Ir US Guide Vasc Access Right  Result Date: 10/29/2017 INDICATION: 62 year old female with gallbladder adenocarcinoma. She requires durable venous access for chemotherapy. EXAM: IMPLANTED PORT A CATH PLACEMENT WITH ULTRASOUND AND  FLUOROSCOPIC GUIDANCE MEDICATIONS: 2 g Ancef; The antibiotic was administered within an appropriate time interval prior to skin puncture. ANESTHESIA/SEDATION: Versed 1.5 mg IV; Fentanyl 75 mcg IV; Moderate Sedation Time:  28 minutes The patient was continuously monitored during the procedure by the interventional radiology nurse under my direct supervision. FLUOROSCOPY TIME:  4 minutes, 0 seconds (59 mGy) COMPLICATIONS: None immediate. PROCEDURE: The right neck and chest was prepped with chlorhexidine, and draped in the usual sterile fashion using maximum barrier technique (cap and mask, sterile gown, sterile gloves, large sterile sheet, hand hygiene and cutaneous antiseptic). Antibiotic prophylaxis was provided with 2g Ancef administered IV one hour prior to skin incision. Local anesthesia was attained by infiltration with 1% lidocaine with epinephrine. Ultrasound demonstrated patency of the right internal jugular vein, and this was documented with an image. Under real-time ultrasound guidance, this vein was accessed with a 21 gauge micropuncture needle and image documentation was performed. A small dermatotomy was made at the access  site with an 11 scalpel. A 0.018" wire was advanced into the SVC and the access needle exchanged for a 7F micropuncture vascular sheath. The 0.018" wire was then removed and a 0.035" wire advanced into the IVC. An appropriate location for the subcutaneous reservoir was selected below the clavicle and an incision was made through the skin and underlying soft tissues. The subcutaneous tissues were then dissected using a combination of blunt and sharp surgical technique and a pocket was formed. A single lumen power injectable portacatheter was then tunneled through the subcutaneous tissues from the pocket to the dermatotomy and the port reservoir placed within the subcutaneous pocket. The venous access site was then serially dilated and a peel away vascular sheath placed over the wire.  The wire was removed and the port catheter advanced into position under fluoroscopic guidance. The catheter tip is positioned in the upper right atrium. This was documented with a spot image. The portacatheter was then tested and found to flush and aspirate well. The port was flushed with saline followed by 100 units/mL heparinized saline. The pocket was then closed in two layers using first subdermal inverted interrupted absorbable sutures followed by a running subcuticular suture. The epidermis was then sealed with Dermabond. The dermatotomy at the venous access site was also sealed with Dermabond. IMPRESSION: Successful placement of a right IJ approach Power Port with ultrasound and fluoroscopic guidance. The catheter is ready for use. Signed, Criselda Peaches, MD [facility] Radiology Electronically Signed   By: Jacqulynn Cadet M.D.   On: 10/29/2017 12:37   Dg Chest Port 1 View  Result Date: 11/02/2017 CLINICAL DATA:  Sepsis EXAM: PORTABLE CHEST 1 VIEW COMPARISON:  10/17/2017 FINDINGS: Right Port-A-Cath in place with the tip in the upper right atrium. Cardiomegaly. No confluent airspace opacities or effusions. No acute bony abnormality. IMPRESSION: Cardiomegaly.  No active disease. Electronically Signed   By: Rolm Baptise M.D.   On: 11/02/2017 07:10   Dg Ercp Biliary & Pancreatic Ducts  Result Date: 10/30/2017 CLINICAL DATA:  Sphincterotomy; cholangiogram and biliary stent placement. History of gallbladder adenocarcinoma. EXAM: ERCP TECHNIQUE: Multiple spot images obtained with the fluoroscopic device and submitted for interpretation post-procedure. COMPARISON:  CT abdomen and pelvis - 10/26/2017; MRCP - 10/14/2017 FINDINGS: 7 spot fluoroscopic images of the right upper abdominal quadrant during ERCP are provided for review. Initial image demonstrates a ERCP probe overlying the right upper abdominal quadrant. There is selective cannulation and  opacification of the common bile duct with suspected malignant narrowing/occlusion of the biliary hilum. There is moderate to marked intrahepatic biliary dilatation within the opacified portions of the central aspect of the right intrahepatic biliary tree. There is no definitive opacification of the left intrahepatic biliary tree. There is an additional apparent internal plastic stent overlying the expected location of the pancreatic duct. Subsequent images demonstrate deployment of an internal biliary stent across the malignant narrowing of the biliary hilum from the intrahepatic right biliary tree to the level of the ampulla/duodenum. IMPRESSION: ERCP with apparent successful deployment of an internal biliary stent as detailed above. These images were submitted for radiologic interpretation only. Please see the procedural report for the amount of contrast and the fluoroscopy time utilized. Electronically Signed   By: Sandi Mariscal M.D.   On: 10/30/2017 14:31   Dg Abd 2 Views  Result Date: 11/05/2017 CLINICAL DATA:  Continued abdominal pain. Recent ERCP and stent placement a week ago. EXAM: ABDOMEN - 2 VIEW COMPARISON:  CT  abdomen pelvis dated October 26, 2017. FINDINGS: Common bile duct stent is grossly unchanged in position. The bowel gas pattern is normal. There is no evidence of free air. Calcified fibroids in the pelvis. Multiple large left renal calculi are again noted. Atherosclerotic vascular calcification with infrarenal abdominal aortic aneurysm again noted. No acute osseous abnormality. IMPRESSION: 1. No definite acute abnormality. Common bile duct stent is grossly unchanged in position. 2. Unchanged left nephrolithiasis. Electronically Signed   By: Titus Dubin M.D.   On: 11/05/2017 09:38   Ir Fluoro Guide Port Insertion Right  Result Date: 10/29/2017 INDICATION: 62 year old female with gallbladder adenocarcinoma. She requires durable venous access for chemotherapy. EXAM: IMPLANTED PORT A CATH  PLACEMENT WITH ULTRASOUND AND FLUOROSCOPIC GUIDANCE MEDICATIONS: 2 g Ancef; The antibiotic was administered within an appropriate time interval prior to skin puncture. ANESTHESIA/SEDATION: Versed 1.5 mg IV; Fentanyl 75 mcg IV; Moderate Sedation Time:  28 minutes The patient was continuously monitored during the procedure by the interventional radiology nurse under my direct supervision. FLUOROSCOPY TIME:  4 minutes, 0 seconds (59 mGy) COMPLICATIONS: None immediate. PROCEDURE: The right neck and chest was prepped with chlorhexidine, and draped in the usual sterile fashion using maximum barrier technique (cap and mask, sterile gown, sterile gloves, large sterile sheet, hand hygiene and cutaneous antiseptic). Antibiotic prophylaxis was provided with 2g Ancef administered IV one hour prior to skin incision. Local anesthesia was attained by infiltration with 1% lidocaine with epinephrine. Ultrasound demonstrated patency of the right internal jugular vein, and this was documented with an image. Under real-time ultrasound guidance, this vein was accessed with a 21 gauge micropuncture needle and image documentation was performed. A small dermatotomy was made at the access site with an 11 scalpel. A 0.018" wire was advanced into the SVC and the access needle exchanged for a 85F micropuncture vascular sheath. The 0.018" wire was then removed and a 0.035" wire advanced into the IVC. An appropriate location for the subcutaneous reservoir was selected below the clavicle and an incision was made through the skin and underlying soft tissues. The subcutaneous tissues were then dissected using a combination of blunt and sharp surgical technique and a pocket was formed. A single lumen power injectable portacatheter was then tunneled through the subcutaneous tissues from the pocket to the dermatotomy and the port reservoir placed within the subcutaneous pocket. The venous access site was then serially dilated and a peel away vascular  sheath placed over the wire. The wire was removed and the port catheter advanced into position under fluoroscopic guidance. The catheter tip is positioned in the upper right atrium. This was documented with a spot image. The portacatheter was then tested and found to flush and aspirate well. The port was flushed with saline followed by 100 units/mL heparinized saline. The pocket was then closed in two layers using first subdermal inverted interrupted absorbable sutures followed by a running subcuticular suture. The epidermis was then sealed with Dermabond. The dermatotomy at the venous access site was also sealed with Dermabond. IMPRESSION: Successful placement of a right IJ approach Power Port with ultrasound and fluoroscopic guidance. The catheter is ready for use. Signed, Criselda Peaches, MD [facility] Radiology Electronically Signed   By: Jacqulynn Cadet M.D.   On: 10/29/2017 12:37    Discharge Exam: Vitals:   11/05/17 0359 11/05/17 1457  BP: (!) 159/70 (!) 143/71  Pulse: 68 65  Resp: 15 16  Temp: 99 F (37.2 C) 98.8 F (37.1 C)  SpO2: 99% 100%   Vitals:   11/04/17 2046 11/05/17 0359 11/05/17 0627 11/05/17 1457  BP: (!) 160/76 (!) 159/70  (!) 143/71  Pulse: 65 68  65  Resp: 17 15  16   Temp: 99 F (37.2 C) 99 F (37.2 C)  98.8 F (37.1 C)  TempSrc: Oral Oral  Oral  SpO2: 100% 99%  100%  Weight:   94.2 kg (207 lb 10.8 oz)   Height:        General: Pt is alert, awake, not in acute distress HEENT: Mild scleral icterus Abdominal: Soft, NT, ND, Extremities: no edema  The results of significant diagnostics from this hospitalization (including imaging, microbiology, ancillary and laboratory) are listed below for reference.     Microbiology: Recent Results (from the past 240 hour(s))  Blood culture (routine x 2)     Status: Abnormal (Preliminary result)   Collection Time: 11/02/17 12:31 AM  Result Value Ref Range Status    Specimen Description   Final    BLOOD RIGHT PORTA CATH Performed at [facility], [address], [place], [place] [address]    Special Requests   Final    BOTTLES DRAWN AEROBIC AND ANAEROBIC Blood Culture adequate volume Performed at [facility] [address], [place], [place] [address]    Culture  Setup Time (A)  Final    GRAM VARIABLE ROD ANAEROBIC BOTTLE ONLY CRITICAL RESULT CALLED TO, READ BACK BY AND VERIFIED WITH: ARUNYON,PHARMD @1530  11/04/17 BY LHOWARD    Culture (A)  Final    GRAM VARIABLE ROD CULTURE REINCUBATED FOR BETTER GROWTH Performed at [facility], [place] [address], [place], [place] [address]    Report Status PENDING  Incomplete  Blood Culture ID Panel (Reflexed)     Status: None   Collection Time: 11/02/17 12:31 AM  Result Value Ref Range Status   Enterococcus species NOT DETECTED NOT DETECTED Final   Listeria monocytogenes NOT DETECTED NOT DETECTED Final   Staphylococcus species NOT DETECTED NOT DETECTED Final   Staphylococcus aureus NOT DETECTED NOT DETECTED Final   Streptococcus species NOT DETECTED NOT DETECTED Final   Streptococcus agalactiae NOT DETECTED NOT DETECTED Final   Streptococcus pneumoniae NOT DETECTED NOT DETECTED Final   Streptococcus pyogenes NOT DETECTED NOT DETECTED Final   Acinetobacter baumannii NOT DETECTED NOT DETECTED Final   Enterobacteriaceae species NOT DETECTED NOT DETECTED Final   Enterobacter cloacae complex NOT DETECTED NOT DETECTED Final   Escherichia coli NOT DETECTED NOT DETECTED Final   Klebsiella oxytoca NOT DETECTED NOT DETECTED Final   Klebsiella pneumoniae NOT DETECTED NOT DETECTED Final   Proteus species NOT DETECTED NOT DETECTED Final   Serratia marcescens NOT DETECTED NOT DETECTED Final   Haemophilus influenzae NOT DETECTED NOT DETECTED Final   Neisseria meningitidis NOT DETECTED NOT DETECTED Final   Pseudomonas aeruginosa NOT DETECTED NOT DETECTED Final   Candida albicans NOT  DETECTED NOT DETECTED Final   Candida glabrata NOT DETECTED NOT DETECTED Final   Candida krusei NOT DETECTED NOT DETECTED Final   Candida parapsilosis NOT DETECTED NOT DETECTED Final   Candida tropicalis NOT DETECTED NOT DETECTED Final    Comment: Performed at [facility], [place] [address] [place], [place] [address]  Urine culture     Status: None   Collection Time: 11/02/17  6:00 AM  Result Value Ref Range Status   Specimen Description   Final    URINE, CATHETERIZED Performed at [facility]  [address], [place], [place] [address]    Special Requests   Final    NONE Performed at [facility], [place] [address] [place], [place] [address]    Culture   Final    NO GROWTH Performed at [facility], [place] [address], [place], [place] [address]    Report Status 11/03/2017 FINAL  Final  Culture, blood (routine x 2)     Status: None (Preliminary result)   Collection Time: 11/02/17  6:06 AM  Result Value Ref Range Status   Specimen Description   Final    BLOOD LEFT HAND Performed at [facility] [address] [place], [place] [address]    Special Requests   Final    BOTTLES DRAWN AEROBIC AND ANAEROBIC Blood Culture adequate volume Performed at [facility] [address] [place], [place] [address]    Culture   Final    NO GROWTH 2 DAYS Performed at [facility] [address], [place], [place] [address]    Report Status PENDING  Incomplete  Respiratory Panel by PCR     Status: None   Collection Time: 11/02/17  6:06 AM  Result Value Ref Range Status   Adenovirus NOT DETECTED NOT DETECTED Final   Coronavirus 229E NOT DETECTED NOT DETECTED Final   Coronavirus HKU1 NOT DETECTED NOT DETECTED Final   Coronavirus NL63 NOT DETECTED NOT DETECTED Final   Coronavirus OC43 NOT DETECTED NOT DETECTED Final   Metapneumovirus NOT DETECTED NOT DETECTED Final   Rhinovirus / Enterovirus NOT  DETECTED NOT DETECTED Final   Influenza A NOT DETECTED NOT DETECTED Final   Influenza B NOT DETECTED NOT DETECTED Final   Parainfluenza Virus 1 NOT DETECTED NOT DETECTED Final   Parainfluenza Virus 2 NOT DETECTED NOT DETECTED Final   Parainfluenza Virus 3 NOT DETECTED NOT DETECTED Final   Parainfluenza Virus 4 NOT DETECTED NOT DETECTED Final   Respiratory Syncytial Virus NOT DETECTED NOT DETECTED Final   Bordetella pertussis NOT DETECTED NOT DETECTED Final   Chlamydophila pneumoniae NOT DETECTED NOT DETECTED Final   Mycoplasma pneumoniae NOT DETECTED NOT DETECTED Final    Comment: Performed at [facility], [place] [address], [place], [place] [address]  Culture, blood (routine x 2)     Status: None (Preliminary result)   Collection Time: 11/03/17  3:46 AM  Result Value Ref Range Status   Specimen Description   Final    BLOOD PORTA CATH Performed at [facility], [place] [address] [place], [place] [address]    Special Requests   Final    BOTTLES DRAWN AEROBIC AND ANAEROBIC Blood Culture adequate volume Performed at [facility] [address], [place], [place] [address]    Culture   Final    NO GROWTH 1 DAY Performed at [facility], [place] [address], [place],  [address]    Report Status PENDING  Incomplete     Labs: BNP (last 3 results) No results for input(s): BNP in the last 8760 hours. Basic Metabolic Panel: Recent Labs  Lab 11/02/17 0114 11/02/17 0840 11/03/17 0441 11/04/17 0656 11/05/17 1329  NA 131* 133* 135 135 135  K 3.8 4.3 3.3* 3.6 3.7  CL 102 105 106 104 103  CO2 17* 15* 19* 20* 22  GLUCOSE 240* 256* 121* 193* 214*  BUN 32* 30* 27* 22* 18  CREATININE 1.67* 1.59* 1.32* 1.27* 1.27*  CALCIUM 8.6* 8.0* 8.1* 8.4* 8.6*  MG 1.4*  --  1.7  --   --   PHOS  --   --  2.4*  --   --    Liver Function Tests: Recent Labs  Lab 11/02/17 0031 11/02/17 0114 11/03/17 0441 11/04/17 0656 11/05/17 1329  AST 35 63* 229* 108* 54*   ALT 27 48 95* 88* 65*  ALKPHOS 292* 497* 384* 399* 452*  BILITOT 3.7* 6.3* 4.6* 3.9* 4.0*  PROT 3.7* 6.4* 5.9* 6.3* 6.4*  ALBUMIN <1.0* 1.7* 1.5* 1.5* 1.7*   No results for input(s): LIPASE, AMYLASE in the last 168 hours. No results for input(s): AMMONIA in the last 168 hours. CBC: Recent Labs  Lab 11/02/17 0031 11/02/17 0114 11/03/17 0347 11/04/17 0656 11/05/17 1329  WBC 5.0 10.1 18.4* 16.2* 14.8*  NEUTROABS 4.8 9.5* 15.0* 13.1*  --   HGB 5.5* 8.0* 7.4* 7.5* 7.8*  HCT 17.8* 25.4* 23.1* 23.8* 24.7*  MCV 91.8 92.4 92.4 92.2 91.8  PLT 196 306 323 310 311   Cardiac Enzymes: No results for input(s): CKTOTAL, CKMB, CKMBINDEX, TROPONINI in the last 168 hours. BNP: Invalid input(s): POCBNP CBG: Recent Labs  Lab 11/04/17 1156 11/04/17 1755 11/04/17 2049 11/05/17 0731 11/05/17 1407  GLUCAP 222* 228* 236* 176* 194*   D-Dimer No results for input(s): DDIMER in the last 72 hours. Hgb A1c No results for input(s): HGBA1C in the last 72 hours. Lipid Profile No results for input(s): CHOL, HDL, LDLCALC, TRIG, CHOLHDL, LDLDIRECT in the last 72 hours. Thyroid function studies No results for input(s): TSH, T4TOTAL, T3FREE, THYROIDAB in the last 72 hours.  Invalid input(s): FREET3 Anemia work up No results for input(s): VITAMINB12, FOLATE, FERRITIN, TIBC, IRON, RETICCTPCT in the last 72 hours. Urinalysis    Component Value Date/Time   COLORURINE AMBER (A) 11/02/2017 0600   APPEARANCEUR HAZY (A) 11/02/2017 0600   LABSPEC 1.017 11/02/2017 0600   PHURINE 5.0 11/02/2017 0600   GLUCOSEU 50 (A) 11/02/2017 0600   HGBUR SMALL (A) 11/02/2017 0600   BILIRUBINUR SMALL (A) 11/02/2017 0600   KETONESUR NEGATIVE 11/02/2017 0600   PROTEINUR 100 (A) 11/02/2017 0600   NITRITE NEGATIVE 11/02/2017 0600   LEUKOCYTESUR TRACE (A) 11/02/2017 0600   Sepsis Labs Invalid input(s): PROCALCITONIN,  WBC,  LACTICIDVEN Microbiology Recent Results (from the past 240 hour(s))  Blood culture (routine x  2)     Status: Abnormal (Preliminary result)   Collection Time: 11/02/17 12:31 AM  Result Value Ref Range Status   Specimen Description   Final    BLOOD RIGHT PORTA CATH Performed at [facility], [place] [address] [place], [place] [address]    Special Requests   Final    BOTTLES DRAWN AEROBIC AND ANAEROBIC Blood Culture adequate volume Performed at [facility] [address], [place], [place] [address]    Culture  Setup Time (A)  Final    GRAM VARIABLE ROD ANAEROBIC BOTTLE ONLY CRITICAL RESULT CALLED TO, READ BACK BY AND VERIFIED WITH: ARUNYON,PHARMD @1530  11/04/17 BY LHOWARD    Culture (A)  Final    GRAM VARIABLE ROD CULTURE REINCUBATED FOR BETTER GROWTH Performed at [facility], [place] [address] [place], [place] [address]    Report Status PENDING  Incomplete  Blood Culture ID Panel (Reflexed)     Status: None   Collection Time: 11/02/17 12:31 AM  Result Value Ref Range Status   Enterococcus species NOT DETECTED NOT DETECTED Final   Listeria monocytogenes NOT DETECTED NOT DETECTED Final   Staphylococcus  species NOT DETECTED NOT DETECTED Final   Staphylococcus aureus NOT DETECTED NOT DETECTED Final   Streptococcus species NOT DETECTED NOT DETECTED Final   Streptococcus agalactiae NOT DETECTED NOT DETECTED Final   Streptococcus pneumoniae NOT DETECTED NOT DETECTED Final   Streptococcus pyogenes NOT DETECTED NOT DETECTED Final   Acinetobacter baumannii NOT DETECTED NOT DETECTED Final   Enterobacteriaceae species NOT DETECTED NOT DETECTED Final   Enterobacter cloacae complex NOT DETECTED NOT DETECTED Final   Escherichia coli NOT DETECTED NOT DETECTED Final   Klebsiella oxytoca NOT DETECTED NOT DETECTED Final   Klebsiella pneumoniae NOT DETECTED NOT DETECTED Final   Proteus species NOT DETECTED NOT DETECTED Final   Serratia marcescens NOT DETECTED NOT DETECTED Final   Haemophilus influenzae NOT DETECTED NOT DETECTED Final   Neisseria meningitidis  NOT DETECTED NOT DETECTED Final   Pseudomonas aeruginosa NOT DETECTED NOT DETECTED Final   Candida albicans NOT DETECTED NOT DETECTED Final   Candida glabrata NOT DETECTED NOT DETECTED Final   Candida krusei NOT DETECTED NOT DETECTED Final   Candida parapsilosis NOT DETECTED NOT DETECTED Final   Candida tropicalis NOT DETECTED NOT DETECTED Final    Comment: Performed at [facility], [place] [address] [place], [place] [address]  Urine culture     Status: None   Collection Time: 11/02/17  6:00 AM  Result Value Ref Range Status   Specimen Description   Final    URINE, CATHETERIZED Performed at [facility] [address], [place], [place] [address]    Special Requests   Final    NONE Performed at [facility], [place] [address] [place], [place] [address]    Culture   Final    NO GROWTH Performed at [facility], [place] [address], [place], [place] [address]    Report Status 11/03/2017 FINAL  Final  Culture, blood (routine x 2)     Status: None (Preliminary result)   Collection Time: 11/02/17  6:06 AM  Result Value Ref Range Status   Specimen Description   Final    BLOOD LEFT HAND Performed at [facility] [address], [place], [place] [address]    Special Requests   Final    BOTTLES DRAWN AEROBIC AND ANAEROBIC Blood Culture adequate volume Performed at [facility] [address] [place], [place] [address]    Culture   Final    NO GROWTH 2 DAYS Performed at [facility] [address], [place],  [address]    Report Status PENDING  Incomplete  Respiratory Panel by PCR     Status: None   Collection Time: 11/02/17  6:06 AM  Result Value Ref Range Status   Adenovirus NOT DETECTED NOT DETECTED Final   Coronavirus 229E NOT DETECTED NOT DETECTED Final   Coronavirus HKU1 NOT DETECTED NOT DETECTED Final   Coronavirus NL63 NOT DETECTED NOT DETECTED Final   Coronavirus OC43 NOT  DETECTED NOT DETECTED Final   Metapneumovirus NOT DETECTED NOT DETECTED Final   Rhinovirus / Enterovirus NOT DETECTED NOT DETECTED Final   Influenza A NOT DETECTED NOT DETECTED Final   Influenza B NOT DETECTED NOT DETECTED Final   Parainfluenza Virus 1 NOT DETECTED NOT DETECTED Final   Parainfluenza Virus 2 NOT DETECTED NOT DETECTED Final   Parainfluenza Virus 3 NOT DETECTED NOT DETECTED Final   Parainfluenza Virus 4 NOT DETECTED NOT DETECTED Final   Respiratory Syncytial Virus NOT DETECTED NOT DETECTED Final  Bordetella pertussis NOT DETECTED NOT DETECTED Final   Chlamydophila pneumoniae NOT DETECTED NOT DETECTED Final   Mycoplasma pneumoniae NOT DETECTED NOT DETECTED Final    Comment: Performed at [facility], [place] [address], [place], [place] [address]  Culture, blood (routine x 2)     Status: None (Preliminary result)   Collection Time: 11/03/17  3:46 AM  Result Value Ref Range Status   Specimen Description   Final    BLOOD PORTA CATH Performed at [facility], [place] [address] [place], [place] [address]    Special Requests   Final    BOTTLES DRAWN AEROBIC AND ANAEROBIC Blood Culture adequate volume Performed at [facility] [address], [place], [place] [address]    Culture   Final    NO GROWTH 1 DAY Performed at [facility], [place] [address] [place], [place] [address]    Report Status PENDING  Incomplete     Time coordinating discharge: 35 minutes  SIGNED:  Chipper Oman, MD  [facility] 11/05/2017, 3:01 PM  Pager please text page via  [email or web address]  Note - This record has been created using [employer]. Chart creation errors have been sought, but may not always have been located. Such creation errors do not reflect on the standard of medical care.

## 2017-11-06 ENCOUNTER — Telehealth: Payer: Self-pay | Admitting: Oncology

## 2017-11-06 ENCOUNTER — Ambulatory Visit: Payer: BLUE CROSS/BLUE SHIELD | Admitting: Oncology

## 2017-11-06 ENCOUNTER — Other Ambulatory Visit: Payer: BLUE CROSS/BLUE SHIELD

## 2017-11-06 ENCOUNTER — Ambulatory Visit: Payer: BLUE CROSS/BLUE SHIELD

## 2017-11-06 NOTE — Telephone Encounter (Signed)
Scheduled appt per 2/25 sch message - pt is aware of appt date and time   

## 2017-11-07 ENCOUNTER — Ambulatory Visit: Payer: BLUE CROSS/BLUE SHIELD

## 2017-11-07 LAB — CULTURE, BLOOD (ROUTINE X 2)
CULTURE: NO GROWTH
SPECIAL REQUESTS: ADEQUATE
Special Requests: ADEQUATE

## 2017-11-08 LAB — CULTURE, BLOOD (ROUTINE X 2)
CULTURE: NO GROWTH
SPECIAL REQUESTS: ADEQUATE

## 2017-11-09 ENCOUNTER — Telehealth: Payer: Self-pay | Admitting: Oncology

## 2017-11-09 NOTE — Telephone Encounter (Signed)
11/09/17 Spoke with patient's caregiver, Cecily regarding her FMLA.  She requested a e-mail be sent for her personal records to: [email or web address].  It was successfully faxed to [facility] @ [phone] @ 11:53 am.

## 2017-11-10 ENCOUNTER — Other Ambulatory Visit: Payer: Self-pay | Admitting: Oncology

## 2017-11-13 ENCOUNTER — Inpatient Hospital Stay: Payer: BLUE CROSS/BLUE SHIELD | Attending: Oncology | Admitting: Oncology

## 2017-11-13 ENCOUNTER — Inpatient Hospital Stay: Payer: BLUE CROSS/BLUE SHIELD

## 2017-11-13 ENCOUNTER — Telehealth: Payer: Self-pay | Admitting: *Deleted

## 2017-11-13 ENCOUNTER — Telehealth: Payer: Self-pay

## 2017-11-13 DIAGNOSIS — C23 Malignant neoplasm of gallbladder: Secondary | ICD-10-CM | POA: Insufficient documentation

## 2017-11-13 DIAGNOSIS — K269 Duodenal ulcer, unspecified as acute or chronic, without hemorrhage or perforation: Secondary | ICD-10-CM | POA: Insufficient documentation

## 2017-11-13 DIAGNOSIS — C787 Secondary malignant neoplasm of liver and intrahepatic bile duct: Secondary | ICD-10-CM | POA: Insufficient documentation

## 2017-11-13 DIAGNOSIS — D500 Iron deficiency anemia secondary to blood loss (chronic): Secondary | ICD-10-CM | POA: Diagnosis not present

## 2017-11-13 DIAGNOSIS — E119 Type 2 diabetes mellitus without complications: Secondary | ICD-10-CM | POA: Insufficient documentation

## 2017-11-13 DIAGNOSIS — Z5111 Encounter for antineoplastic chemotherapy: Secondary | ICD-10-CM | POA: Diagnosis not present

## 2017-11-13 DIAGNOSIS — Z95828 Presence of other vascular implants and grafts: Secondary | ICD-10-CM

## 2017-11-13 LAB — CMP (CANCER CENTER ONLY)
ALT: 20 U/L (ref 0–55)
ANION GAP: 10 (ref 3–11)
AST: 27 U/L (ref 5–34)
Albumin: 1.6 g/dL — ABNORMAL LOW (ref 3.5–5.0)
Alkaline Phosphatase: 287 U/L — ABNORMAL HIGH (ref 40–150)
BUN: 15 mg/dL (ref 7–26)
CHLORIDE: 100 mmol/L (ref 98–109)
CO2: 23 mmol/L (ref 22–29)
Calcium: 9.1 mg/dL (ref 8.4–10.4)
Creatinine: 1.31 mg/dL — ABNORMAL HIGH (ref 0.60–1.10)
GFR, EST AFRICAN AMERICAN: 49 mL/min — AB (ref >60)
GFR, Estimated: 42 mL/min — ABNORMAL LOW (ref >60)
Glucose, Bld: 293 mg/dL — ABNORMAL HIGH (ref 70–140)
POTASSIUM: 3.6 mmol/L (ref 3.5–5.1)
SODIUM: 133 mmol/L — AB (ref 136–145)
Total Bilirubin: 2.9 mg/dL — ABNORMAL HIGH (ref 0.2–1.2)
Total Protein: 6.8 g/dL (ref 6.4–8.3)

## 2017-11-13 LAB — CBC WITH DIFFERENTIAL (CANCER CENTER ONLY)
Basophils Absolute: 0.2 10*3/uL — ABNORMAL HIGH (ref 0.0–0.1)
Basophils Relative: 1 %
EOS ABS: 0.2 10*3/uL (ref 0.0–0.5)
Eosinophils Relative: 1 %
HEMATOCRIT: 22.2 % — AB (ref 34.8–46.6)
HEMOGLOBIN: 7 g/dL — AB (ref 11.6–15.9)
LYMPHS ABS: 0.8 10*3/uL — AB (ref 0.9–3.3)
LYMPHS PCT: 6 %
MCH: 28.4 pg (ref 25.1–34.0)
MCHC: 31.4 g/dL — AB (ref 31.5–36.0)
MCV: 90.4 fL (ref 79.5–101.0)
Monocytes Absolute: 1.2 10*3/uL — ABNORMAL HIGH (ref 0.1–0.9)
Monocytes Relative: 8 %
NEUTROS ABS: 12.4 10*3/uL — AB (ref 1.5–6.5)
NEUTROS PCT: 84 %
Platelet Count: 318 10*3/uL (ref 145–400)
RBC: 2.46 MIL/uL — AB (ref 3.70–5.45)
RDW: 17.9 % — ABNORMAL HIGH (ref 11.2–14.5)
WBC: 14.8 10*3/uL — AB (ref 3.9–10.3)

## 2017-11-13 LAB — SAMPLE TO BLOOD BANK

## 2017-11-13 LAB — ABO/RH: ABO/RH(D): AB POS

## 2017-11-13 LAB — CEA (IN HOUSE-CHCC): CEA (CHCC-IN HOUSE): 2.55 ng/mL (ref 0.00–5.00)

## 2017-11-13 MED ORDER — DEXAMETHASONE SODIUM PHOSPHATE 10 MG/ML IJ SOLN
INTRAMUSCULAR | Status: AC
  Filled 2017-11-13: qty 1

## 2017-11-13 MED ORDER — OXALIPLATIN CHEMO INJECTION 100 MG/20ML
85.0000 mg/m2 | Freq: Once | INTRAVENOUS | Status: AC
  Administered 2017-11-13: 170 mg via INTRAVENOUS
  Filled 2017-11-13: qty 34

## 2017-11-13 MED ORDER — DEXAMETHASONE SODIUM PHOSPHATE 10 MG/ML IJ SOLN
10.0000 mg | Freq: Once | INTRAMUSCULAR | Status: AC
  Administered 2017-11-13: 10 mg via INTRAVENOUS

## 2017-11-13 MED ORDER — PALONOSETRON HCL INJECTION 0.25 MG/5ML
INTRAVENOUS | Status: AC
  Filled 2017-11-13: qty 5

## 2017-11-13 MED ORDER — SODIUM CHLORIDE 0.9% FLUSH
10.0000 mL | INTRAVENOUS | Status: DC | PRN
  Filled 2017-11-13: qty 10

## 2017-11-13 MED ORDER — HYDROCODONE-ACETAMINOPHEN 5-325 MG PO TABS
1.0000 | ORAL_TABLET | Freq: Once | ORAL | Status: AC
  Administered 2017-11-13: 1 via ORAL

## 2017-11-13 MED ORDER — PALONOSETRON HCL INJECTION 0.25 MG/5ML
0.2500 mg | Freq: Once | INTRAVENOUS | Status: AC
  Administered 2017-11-13: 0.25 mg via INTRAVENOUS

## 2017-11-13 MED ORDER — FLUOROURACIL CHEMO INJECTION 2.5 GM/50ML: 400.0000 mg/m2 | Freq: Once | INTRAVENOUS | Status: DC

## 2017-11-13 MED ORDER — HEPARIN SOD (PORK) LOCK FLUSH 100 UNIT/ML IV SOLN
500.0000 [IU] | Freq: Once | INTRAVENOUS | Status: DC | PRN
  Filled 2017-11-13: qty 5

## 2017-11-13 MED ORDER — SODIUM CHLORIDE 0.9% FLUSH
10.0000 mL | Freq: Once | INTRAVENOUS | Status: AC
  Administered 2017-11-13: 10 mL
  Filled 2017-11-13: qty 10

## 2017-11-13 MED ORDER — LEUCOVORIN CALCIUM INJECTION 350 MG
400.0000 mg/m2 | Freq: Once | INTRAVENOUS | Status: AC
  Administered 2017-11-13: 800 mg via INTRAVENOUS
  Filled 2017-11-13: qty 40

## 2017-11-13 MED ORDER — DEXTROSE 5 % IV SOLN
Freq: Once | INTRAVENOUS | Status: AC
  Administered 2017-11-13: 14:00:00 via INTRAVENOUS

## 2017-11-13 MED ORDER — SODIUM CHLORIDE 0.9 % IV SOLN
1200.0000 mg/m2 | INTRAVENOUS | Status: DC
  Administered 2017-11-13: 2400 mg via INTRAVENOUS
  Filled 2017-11-13: qty 48

## 2017-11-13 MED ORDER — HYDROCODONE-ACETAMINOPHEN 5-325 MG PO TABS
ORAL_TABLET | ORAL | Status: AC
  Filled 2017-11-13: qty 1

## 2017-11-13 MED ORDER — LIDOCAINE-PRILOCAINE 2.5-2.5 % EX CREA: TOPICAL_CREAM | CUTANEOUS | 0 refills | Status: AC

## 2017-11-13 NOTE — Progress Notes (Signed)
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  Noted recent hospital admission with sepsis, weakness and chills.    Met with patient and daughter during infusion today.  Patient reports that she is trying to eat every few hours but she knows she needs to do better.  Really continues to have no appetite.  Only 2 episodes of nausea but can't pinpoint where they came from.  Daughter reports that she is drinking 2 ensure clear per day as she can tolerate those better than milky drinks.  Reports milky things in general turn her off (ie cheese, milk, etc).  Has been tolerating eggs, grits, peanut butter and crackers, bacon, turkey wings, chicken.     MEDICATIONS: reviewed   LABS: reviewed   ANTHROPOMETRICS: Weight decreased to 182 lb 11.2 oz (today from 190 lb on 2/11.   UBW of 206 lb on Sep 21, 2017  12% weight loss in 2 months, signficant     NUTRITION DIAGNOSIS:  Inadequate oral intake related to poor appetite, cancer and cancer related treatment as evidenced by 12% weight loss in the last 2 months and eating  < or equal to 50% energy intake for > or equal to 5 days.     INTERVENTION:   Encouraged patient to continue with small frequent meals and choosing high calorie, high protein foods. Encouraged intake of ensure clear for added nutrition Encouraged patient to try lactose free foods  GOAL:  Patient will consume adequate calories to maintain weight   MONITOR:   weight trends, intake   Next Visit: Wednesday,  March 20 during infusion    B. , RD, LDN Registered Dietitian [phone] (pager)       

## 2017-11-13 NOTE — Progress Notes (Signed)
  Oncology Nurse Navigator Documentation  Navigator Location: [facility] (11/13/17 1318)   )Navigator Encounter Type: Treatment;Follow-up Appt (11/13/17 1318)                   Treatment Initiated Date: 11/13/17 (11/13/17 1318)   Treatment Phase: First Chemo Tx (11/13/17 1318) Barriers/Navigation Needs: No barriers at this time (11/13/17 1318)   Interventions: Psycho-social support (11/13/17 1318) Patient not feeling well today. Patient has daughter, sister-in-law and husband at appointment today. Emotional support offered. No barriers to tx identified.            Acuity: Level 2 (11/13/17 1318)         Time Spent with Patient: 15 (11/13/17 1318)

## 2017-11-13 NOTE — Progress Notes (Signed)
[facility] OFFICE PROGRESS NOTE   Diagnosis: Gallbladder carcinoma  INTERVAL HISTORY:   Julie Barker was discharged from the hospital on 11/05/2017 after admission with sepsis syndrome.  No clear source for infection was identified, though the clinical diagnosis was septicemia from the biliary tract.  She completed an outpatient course of ciprofloxacin.  She denies recurrent high fever and chills.  She has intermittent abdominal pain, worse after eating.  No bleeding.  She reports malaise.  She had one episode of emesis when going to see Dr. Lysle Rubens.  Objective:  Vital signs in last 24 hours:  Blood pressure (!) 108/52, pulse 70, temperature 98.2 F (36.8 C), temperature source Oral, resp. rate 17, height 5\' 6"  (1.676 m), weight 182 lb 11.2 oz (82.9 kg).    HEENT: Scleral icterus.  No thrush. Resp: Lungs clear bilaterally Cardio: Regular rate and rhythm GI: Firm fullness in the right upper abdomen, no splenomegaly Vascular: No leg edema    Portacath/PICC-without erythema  Lab Results:  Hemoglobin 7, platelets 318,000, white count 14.8, ANC 12.4  CMP     Component Value Date/Time   NA 133 (L) 11/13/2017 0901   K 3.6 11/13/2017 0901   CL 100 11/13/2017 0901   CO2 23 11/13/2017 0901   GLUCOSE 293 (H) 11/13/2017 0901   BUN 15 11/13/2017 0901   CREATININE 1.31 (H) 11/13/2017 0901   CALCIUM 9.1 11/13/2017 0901   PROT 6.8 11/13/2017 0901   ALBUMIN 1.6 (L) 11/13/2017 0901   AST 27 11/13/2017 0901   ALT 20 11/13/2017 0901   ALKPHOS 287 (H) 11/13/2017 0901   BILITOT 2.9 (H) 11/13/2017 0901   GFRNONAA 42 (L) 11/13/2017 0901   GFRAA 49 (L) 11/13/2017 0901    Lab Results  Component Value Date   CEA1 8.4 (H) 10/18/2017     Medications: I have reviewed the patient's current medications.   Assessment/Plan: 1.Metastatic adenocarcinoma, biopsy of a segment 5 liver mass 09/21/2017-adenocarcinoma  CT abdomen/pelvis 08/17/2017-gallbladder fossa mass, upper  abdominal lymphadenopathy, liver metastases  MRI of the liver 10/14/2017-masslike appearance of the gallbladder with direct invasion of the adjacent liver, multiple hepatic metastases, upper abdominal nodal metastases  CT 10/26/2017- multiple liver metastases, intrahepatic biliary dilatation secondary to gallbladder mass and porta hepatis adenopathy  Cycle 1 FOLFOX 11/13/2017  2.Severe anemia secondary to GI bleeding  Duodenal bulb ulcer noted on endoscopy 10/18/2017, biopsied-inflammation/necrosis consistent with ulceration, focal atypical epithelioid cells of unknown significance   3.Anorexia/weight loss  4.Diabetes  5.Uterine fibroids  6.Biliary obstruction secondary to the gallbladder tumor/adenopathy-status post ERCP and placement of a right hepatic duct stent 10/30/2017  7.   Admission with sepsis syndrome 11/02/2017, likely biliary sepsis   Disposition: Julie Barker has been diagnosed with metastatic adenocarcinoma, most likely of gallbladder origin.  Planned systemic therapy has been delayed over the past few weeks secondary to biliary obstruction and admission with sepsis.  Her overall clinical status appears stable.  The plan is to begin FOLFOX chemotherapy today.  We discussed the increased risk of toxicity from chemotherapy with the persistent mild elevation of the bilirubin.  She agrees to proceed.  The 5-FU bolus will be eliminated and the 5-FU infusion will be dose reduced with this cycle.  She has severe anemia secondary to chronic disease, malnutrition, and GI bleeding.  She will receive 2 units of packed red blood cells over the next few days.  Julie Barker will return for an office visit on 11/23/2017.  25 minutes were spent with the  patient today.  The majority of the time was used for counseling and coordination of care.  Betsy Coder, MD  11/13/2017  12:54 PM

## 2017-11-13 NOTE — Progress Notes (Signed)
Pt c/o abd pain & back pain & rated # 6.  Pain went away after elevating feet & heat applied to chair.  Pain returned @ 2:30 & order received for pain med. 3:30 pm pt states pain is better (gone).

## 2017-11-13 NOTE — Progress Notes (Signed)
Bilirubin 2.9 today, DC bolus IV push and decrease pump to 1200 mg/m2, no change to oxaliplatin at this time.  T.O. Dr Sherrill/ CJones  Henreitta Leber, PharmD

## 2017-11-13 NOTE — Telephone Encounter (Signed)
Received call report re: HGB 7.0. Dr. Benay Spice aware, order received for type and crossmatch, transfuse 2 units this week. Myrtle, Infusion RN made aware.

## 2017-11-13 NOTE — Patient Instructions (Signed)
Oxaliplatin Injection What is this medicine? OXALIPLATIN (ox AL i PLA tin) is a chemotherapy drug. It targets fast dividing cells, like cancer cells, and causes these cells to die. This medicine is used to treat cancers of the colon and rectum, and many other cancers. This medicine may be used for other purposes; ask your health care provider or pharmacist if you have questions. COMMON BRAND NAME(S): Eloxatin What should I tell my health care provider before I take this medicine? They need to know if you have any of these conditions: -kidney disease -an unusual or allergic reaction to oxaliplatin, other chemotherapy, other medicines, foods, dyes, or preservatives -pregnant or trying to get pregnant -breast-feeding How should I use this medicine? This drug is given as an infusion into a vein. It is administered in a hospital or clinic by a specially trained health care professional. Talk to your pediatrician regarding the use of this medicine in children. Special care may be needed. Overdosage: If you think you have taken too much of this medicine contact a poison control center or emergency room at once. NOTE: This medicine is only for you. Do not share this medicine with others. What if I miss a dose? It is important not to miss a dose. Call your doctor or health care professional if you are unable to keep an appointment. What may interact with this medicine? -medicines to increase blood counts like filgrastim, pegfilgrastim, sargramostim -probenecid -some antibiotics like amikacin, gentamicin, neomycin, polymyxin B, streptomycin, tobramycin -zalcitabine Talk to your doctor or health care professional before taking any of these medicines: -acetaminophen -aspirin -ibuprofen -ketoprofen -naproxen This list may not describe all possible interactions. Give your health care provider a list of all the medicines, herbs, non-prescription drugs, or dietary supplements you use. Also tell them if  you smoke, drink alcohol, or use illegal drugs. Some items may interact with your medicine. What should I watch for while using this medicine? Your condition will be monitored carefully while you are receiving this medicine. You will need important blood work done while you are taking this medicine. This medicine can make you more sensitive to cold. Do not drink cold drinks or use ice. Cover exposed skin before coming in contact with cold temperatures or cold objects. When out in cold weather wear warm clothing and cover your mouth and nose to warm the air that goes into your lungs. Tell your doctor if you get sensitive to the cold. This drug may make you feel generally unwell. This is not uncommon, as chemotherapy can affect healthy cells as well as cancer cells. Report any side effects. Continue your course of treatment even though you feel ill unless your doctor tells you to stop. In some cases, you may be given additional medicines to help with side effects. Follow all directions for their use. Call your doctor or health care professional for advice if you get a fever, chills or sore throat, or other symptoms of a cold or flu. Do not treat yourself. This drug decreases your body's ability to fight infections. Try to avoid being around people who are sick. This medicine may increase your risk to bruise or bleed. Call your doctor or health care professional if you notice any unusual bleeding. Be careful brushing and flossing your teeth or using a toothpick because you may get an infection or bleed more easily. If you have any dental work done, tell your dentist you are receiving this medicine. Avoid taking products that contain aspirin, acetaminophen, ibuprofen, naproxen,   or ketoprofen unless instructed by your doctor. These medicines may hide a fever. Do not become pregnant while taking this medicine. Women should inform their doctor if they wish to become pregnant or think they might be pregnant. There  is a potential for serious side effects to an unborn child. Talk to your health care professional or pharmacist for more information. Do not breast-feed an infant while taking this medicine. Call your doctor or health care professional if you get diarrhea. Do not treat yourself. What side effects may I notice from receiving this medicine? Side effects that you should report to your doctor or health care professional as soon as possible: -allergic reactions like skin rash, itching or hives, swelling of the face, lips, or tongue -low blood counts - This drug may decrease the number of white blood cells, red blood cells and platelets. You may be at increased risk for infections and bleeding. -signs of infection - fever or chills, cough, sore throat, pain or difficulty passing urine -signs of decreased platelets or bleeding - bruising, pinpoint red spots on the skin, black, tarry stools, nosebleeds -signs of decreased red blood cells - unusually weak or tired, fainting spells, lightheadedness -breathing problems -chest pain, pressure -cough -diarrhea -jaw tightness -mouth sores -nausea and vomiting -pain, swelling, redness or irritation at the injection site -pain, tingling, numbness in the hands or feet -problems with balance, talking, walking -redness, blistering, peeling or loosening of the skin, including inside the mouth -trouble passing urine or change in the amount of urine Side effects that usually do not require medical attention (report to your doctor or health care professional if they continue or are bothersome): -changes in vision -constipation -hair loss -loss of appetite -metallic taste in the mouth or changes in taste -stomach pain This list may not describe all possible side effects. Call your doctor for medical advice about side effects. You may report side effects to FDA at 1-800-FDA-1088. Where should I keep my medicine? This drug is given in a hospital or clinic and  will not be stored at home. NOTE: This sheet is a summary. It may not cover all possible information. If you have questions about this medicine, talk to your doctor, pharmacist, or health care provider.  2018 Elsevier/Gold Standard (2008-03-24 17:22:47) Leucovorin injection What is this medicine? LEUCOVORIN (loo koe VOR in) is used to prevent or treat the harmful effects of some medicines. This medicine is used to treat anemia caused by a low amount of folic acid in the body. It is also used with 5-fluorouracil (5-FU) to treat colon cancer. This medicine may be used for other purposes; ask your health care provider or pharmacist if you have questions. What should I tell my health care provider before I take this medicine? They need to know if you have any of these conditions: -anemia from low levels of vitamin B-12 in the blood -an unusual or allergic reaction to leucovorin, folic acid, other medicines, foods, dyes, or preservatives -pregnant or trying to get pregnant -breast-feeding How should I use this medicine? This medicine is for injection into a muscle or into a vein. It is given by a health care professional in a hospital or clinic setting. Talk to your pediatrician regarding the use of this medicine in children. Special care may be needed. Overdosage: If you think you have taken too much of this medicine contact a poison control center or emergency room at once. NOTE: This medicine is only for you. Do not share this medicine  with others. What if I miss a dose? This does not apply. What may interact with this medicine? -capecitabine -fluorouracil -phenobarbital -phenytoin -primidone -trimethoprim-sulfamethoxazole This list may not describe all possible interactions. Give your health care provider a list of all the medicines, herbs, non-prescription drugs, or dietary supplements you use. Also tell them if you smoke, drink alcohol, or use illegal drugs. Some items may interact with  your medicine. What should I watch for while using this medicine? Your condition will be monitored carefully while you are receiving this medicine. This medicine may increase the side effects of 5-fluorouracil, 5-FU. Tell your doctor or health care professional if you have diarrhea or mouth sores that do not get better or that get worse. What side effects may I notice from receiving this medicine? Side effects that you should report to your doctor or health care professional as soon as possible: -allergic reactions like skin rash, itching or hives, swelling of the face, lips, or tongue -breathing problems -fever, infection -mouth sores -unusual bleeding or bruising -unusually weak or tired Side effects that usually do not require medical attention (report to your doctor or health care professional if they continue or are bothersome): -constipation or diarrhea -loss of appetite -nausea, vomiting This list may not describe all possible side effects. Call your doctor for medical advice about side effects. You may report side effects to FDA at 1-800-FDA-1088. Where should I keep my medicine? This drug is given in a hospital or clinic and will not be stored at home. NOTE: This sheet is a summary. It may not cover all possible information. If you have questions about this medicine, talk to your doctor, pharmacist, or health care provider.  2018 Elsevier/Gold Standard (2008-03-03 16:50:  Fluorouracil, 5-FU injection What is this medicine? FLUOROURACIL, 5-FU (flure oh YOOR a sil) is a chemotherapy drug. It slows the growth of cancer cells. This medicine is used to treat many types of cancer like breast cancer, colon or rectal cancer, pancreatic cancer, and stomach cancer. This medicine may be used for other purposes; ask your health care provider or pharmacist if you have questions. COMMON BRAND NAME(S): Adrucil What should I tell my health care provider before I take this medicine? They need to  know if you have any of these conditions: -blood disorders -dihydropyrimidine dehydrogenase (DPD) deficiency -infection (especially a virus infection such as chickenpox, cold sores, or herpes) -kidney disease -liver disease -malnourished, poor nutrition -recent or ongoing radiation therapy -an unusual or allergic reaction to fluorouracil, other chemotherapy, other medicines, foods, dyes, or preservatives -pregnant or trying to get pregnant -breast-feeding How should I use this medicine? This drug is given as an infusion or injection into a vein. It is administered in a hospital or clinic by a specially trained health care professional. Talk to your pediatrician regarding the use of this medicine in children. Special care may be needed. Overdosage: If you think you have taken too much of this medicine contact a poison control center or emergency room at once. NOTE: This medicine is only for you. Do not share this medicine with others. What if I miss a dose? It is important not to miss your dose. Call your doctor or health care professional if you are unable to keep an appointment. What may interact with this medicine? -allopurinol -cimetidine -dapsone -digoxin -hydroxyurea -leucovorin -levamisole -medicines for seizures like ethotoin, fosphenytoin, phenytoin -medicines to increase blood counts like filgrastim, pegfilgrastim, sargramostim -medicines that treat or prevent blood clots like warfarin, enoxaparin, and dalteparin -  methotrexate -metronidazole -pyrimethamine -some other chemotherapy drugs like busulfan, cisplatin, estramustine, vinblastine -trimethoprim -trimetrexate -vaccines Talk to your doctor or health care professional before taking any of these medicines: -acetaminophen -aspirin -ibuprofen -ketoprofen -naproxen This list may not describe all possible interactions. Give your health care provider a list of all the medicines, herbs, non-prescription drugs, or  dietary supplements you use. Also tell them if you smoke, drink alcohol, or use illegal drugs. Some items may interact with your medicine. What should I watch for while using this medicine? Visit your doctor for checks on your progress. This drug may make you feel generally unwell. This is not uncommon, as chemotherapy can affect healthy cells as well as cancer cells. Report any side effects. Continue your course of treatment even though you feel ill unless your doctor tells you to stop. In some cases, you may be given additional medicines to help with side effects. Follow all directions for their use. Call your doctor or health care professional for advice if you get a fever, chills or sore throat, or other symptoms of a cold or flu. Do not treat yourself. This drug decreases your body's ability to fight infections. Try to avoid being around people who are sick. This medicine may increase your risk to bruise or bleed. Call your doctor or health care professional if you notice any unusual bleeding. Be careful brushing and flossing your teeth or using a toothpick because you may get an infection or bleed more easily. If you have any dental work done, tell your dentist you are receiving this medicine. Avoid taking products that contain aspirin, acetaminophen, ibuprofen, naproxen, or ketoprofen unless instructed by your doctor. These medicines may hide a fever. Do not become pregnant while taking this medicine. Women should inform their doctor if they wish to become pregnant or think they might be pregnant. There is a potential for serious side effects to an unborn child. Talk to your health care professional or pharmacist for more information. Do not breast-feed an infant while taking this medicine. Men should inform their doctor if they wish to father a child. This medicine may lower sperm counts. Do not treat diarrhea with over the counter products. Contact your doctor if you have diarrhea that lasts more  than 2 days or if it is severe and watery. This medicine can make you more sensitive to the sun. Keep out of the sun. If you cannot avoid being in the sun, wear protective clothing and use sunscreen. Do not use sun lamps or tanning beds/booths. What side effects may I notice from receiving this medicine? Side effects that you should report to your doctor or health care professional as soon as possible: -allergic reactions like skin rash, itching or hives, swelling of the face, lips, or tongue -low blood counts - this medicine may decrease the number of white blood cells, red blood cells and platelets. You may be at increased risk for infections and bleeding. -signs of infection - fever or chills, cough, sore throat, pain or difficulty passing urine -signs of decreased platelets or bleeding - bruising, pinpoint red spots on the skin, black, tarry stools, blood in the urine -signs of decreased red blood cells - unusually weak or tired, fainting spells, lightheadedness -breathing problems -changes in vision -chest pain -mouth sores -nausea and vomiting -pain, swelling, redness at site where injected -pain, tingling, numbness in the hands or feet -redness, swelling, or sores on hands or feet -stomach pain -unusual bleeding Side effects that usually do not require  medical attention (report to your doctor or health care professional if they continue or are bothersome): -changes in finger or toe nails -diarrhea -dry or itchy skin -hair loss -headache -loss of appetite -sensitivity of eyes to the light -stomach upset -unusually teary eyes This list may not describe all possible side effects. Call your doctor for medical advice about side effects. You may report side effects to FDA at 1-800-FDA-1088. Where should I keep my medicine? This drug is given in a hospital or clinic and will not be stored at home. NOTE: This sheet is a summary. It may not cover all possible information. If you have  questions about this medicine, talk to your doctor, pharmacist, or health care provider.  2018 Elsevier/Gold Standard (2008-01-01 13:53:16)  [facility] Discharge Instructions for Patients Receiving Chemotherapy  Today you received the following chemotherapy agents:  Oxaliplatin, Leucovorin, Fluorouracil  To help prevent nausea and vomiting after your treatment, we encourage you to take your nausea medication.    If you develop nausea and vomiting that is not controlled by your nausea medication, call the clinic.   BELOW ARE SYMPTOMS THAT SHOULD BE REPORTED IMMEDIATELY:  *FEVER GREATER THAN 100.5 F  *CHILLS WITH OR WITHOUT FEVER  NAUSEA AND VOMITING THAT IS NOT CONTROLLED WITH YOUR NAUSEA MEDICATION  *UNUSUAL SHORTNESS OF BREATH  *UNUSUAL BRUISING OR BLEEDING  TENDERNESS IN MOUTH AND THROAT WITH OR WITHOUT PRESENCE OF ULCERS  *URINARY PROBLEMS  *BOWEL PROBLEMS  UNUSUAL RASH Items with * indicate a potential emergency and should be followed up as soon as possible.  Feel free to call the clinic should you have any questions or concerns. The clinic phone number is [phone].  Please show the [place] at check-in to the Emergency Department and triage nurse.

## 2017-11-13 NOTE — Telephone Encounter (Signed)
Printed avs andc calender of upcoming appointment. Per 3/5 los

## 2017-11-14 ENCOUNTER — Other Ambulatory Visit: Payer: Self-pay | Admitting: Nurse Practitioner

## 2017-11-14 ENCOUNTER — Inpatient Hospital Stay: Payer: BLUE CROSS/BLUE SHIELD

## 2017-11-14 DIAGNOSIS — C23 Malignant neoplasm of gallbladder: Secondary | ICD-10-CM

## 2017-11-14 DIAGNOSIS — Z5111 Encounter for antineoplastic chemotherapy: Secondary | ICD-10-CM | POA: Diagnosis not present

## 2017-11-14 LAB — PREPARE RBC (CROSSMATCH)

## 2017-11-14 MED ORDER — PROCHLORPERAZINE EDISYLATE 5 MG/ML IJ SOLN
INTRAMUSCULAR | Status: AC
  Filled 2017-11-14: qty 2

## 2017-11-14 MED ORDER — PROCHLORPERAZINE EDISYLATE 5 MG/ML IJ SOLN
10.0000 mg | Freq: Once | INTRAMUSCULAR | Status: AC
  Administered 2017-11-14: 10 mg via INTRAVENOUS

## 2017-11-14 MED ORDER — HYDROCODONE-ACETAMINOPHEN 5-325 MG PO TABS
ORAL_TABLET | ORAL | Status: AC
  Filled 2017-11-14: qty 1

## 2017-11-14 MED ORDER — SODIUM CHLORIDE 0.9 % IV SOLN: 250.0000 mL | Freq: Once | INTRAVENOUS | Status: DC

## 2017-11-14 MED ORDER — HYDROCODONE-ACETAMINOPHEN 5-325 MG PO TABS
1.0000 | ORAL_TABLET | Freq: Once | ORAL | Status: AC
  Administered 2017-11-14: 1 via ORAL

## 2017-11-14 NOTE — Patient Instructions (Signed)

## 2017-11-15 ENCOUNTER — Inpatient Hospital Stay: Payer: BLUE CROSS/BLUE SHIELD

## 2017-11-15 VITALS — BP 112/59 | HR 73 | Temp 98.5°F | Resp 18

## 2017-11-15 DIAGNOSIS — C23 Malignant neoplasm of gallbladder: Secondary | ICD-10-CM

## 2017-11-15 DIAGNOSIS — Z5111 Encounter for antineoplastic chemotherapy: Secondary | ICD-10-CM | POA: Diagnosis not present

## 2017-11-15 MED ORDER — SODIUM CHLORIDE 0.9% FLUSH
10.0000 mL | INTRAVENOUS | Status: AC | PRN
  Filled 2017-11-15: qty 10

## 2017-11-15 MED ORDER — HYDROCODONE-ACETAMINOPHEN 5-325 MG PO TABS
1.0000 | ORAL_TABLET | Freq: Once | ORAL | Status: AC
  Administered 2017-11-15: 1 via ORAL

## 2017-11-15 MED ORDER — SODIUM CHLORIDE 0.9 % IV SOLN
250.0000 mL | Freq: Once | INTRAVENOUS | Status: AC
  Administered 2017-11-15: 250 mL via INTRAVENOUS

## 2017-11-15 MED ORDER — HEPARIN SOD (PORK) LOCK FLUSH 100 UNIT/ML IV SOLN
500.0000 [IU] | Freq: Once | INTRAVENOUS | Status: AC | PRN
  Administered 2017-11-15: 500 [IU]
  Filled 2017-11-15: qty 5

## 2017-11-15 MED ORDER — HEPARIN SOD (PORK) LOCK FLUSH 100 UNIT/ML IV SOLN
500.0000 [IU] | Freq: Every day | INTRAVENOUS | Status: AC | PRN
  Filled 2017-11-15: qty 5

## 2017-11-15 MED ORDER — HYDROCODONE-ACETAMINOPHEN 5-325 MG PO TABS
ORAL_TABLET | ORAL | Status: AC
  Filled 2017-11-15: qty 1

## 2017-11-15 MED ORDER — PROCHLORPERAZINE MALEATE 10 MG PO TABS
ORAL_TABLET | ORAL | Status: AC
  Filled 2017-11-15: qty 1

## 2017-11-15 MED ORDER — HYDROCODONE-ACETAMINOPHEN 5-325 MG PO TABS: 0.5000 | ORAL_TABLET | Freq: Four times a day (QID) | ORAL | 0 refills | Status: AC | PRN

## 2017-11-15 MED ORDER — SODIUM CHLORIDE 0.9% FLUSH
10.0000 mL | INTRAVENOUS | Status: DC | PRN
  Administered 2017-11-15: 10 mL
  Filled 2017-11-15: qty 10

## 2017-11-15 MED ORDER — PROCHLORPERAZINE MALEATE 10 MG PO TABS
10.0000 mg | ORAL_TABLET | Freq: Once | ORAL | Status: AC
  Administered 2017-11-15: 10 mg via ORAL

## 2017-11-15 NOTE — Patient Instructions (Signed)
Implanted Port Home Guide An implanted port is a type of central line that is placed under the skin. Central lines are used to provide IV access when treatment or nutrition needs to be given through a person's veins. Implanted ports are used for long-term IV access. An implanted port may be placed because:  You need IV medicine that would be irritating to the small veins in your hands or arms.  You need long-term IV medicines, such as antibiotics.  You need IV nutrition for a long period.  You need frequent blood draws for lab tests.  You need dialysis.  Implanted ports are usually placed in the chest area, but they can also be placed in the upper arm, the abdomen, or the leg. An implanted port has two main parts:  Reservoir. The reservoir is round and will appear as a small, raised area under your skin. The reservoir is the part where a needle is inserted to give medicines or draw blood.  Catheter. The catheter is a thin, flexible tube that extends from the reservoir. The catheter is placed into a large vein. Medicine that is inserted into the reservoir goes into the catheter and then into the vein.  How will I care for my incision site? Do not get the incision site wet. Bathe or shower as directed by your health care provider. How is my port accessed? Special steps must be taken to access the port:  Before the port is accessed, a numbing cream can be placed on the skin. This helps numb the skin over the port site.  Your health care provider uses a sterile technique to access the port. ? Your health care provider must put on a mask and sterile gloves. ? The skin over your port is cleaned carefully with an antiseptic and allowed to dry. ? The port is gently pinched between sterile gloves, and a needle is inserted into the port.  Only "non-coring" port needles should be used to access the port. Once the port is accessed, a blood return should be checked. This helps ensure that the port  is in the vein and is not clogged.  If your port needs to remain accessed for a constant infusion, a clear (transparent) bandage will be placed over the needle site. The bandage and needle will need to be changed every week, or as directed by your health care provider.  Keep the bandage covering the needle clean and dry. Do not get it wet. Follow your health care provider's instructions on how to take a shower or bath while the port is accessed.  If your port does not need to stay accessed, no bandage is needed over the port.  What is flushing? Flushing helps keep the port from getting clogged. Follow your health care provider's instructions on how and when to flush the port. Ports are usually flushed with saline solution or a medicine called heparin. The need for flushing will depend on how the port is used.  If the port is used for intermittent medicines or blood draws, the port will need to be flushed: ? After medicines have been given. ? After blood has been drawn. ? As part of routine maintenance.  If a constant infusion is running, the port may not need to be flushed.  How long will my port stay implanted? The port can stay in for as long as your health care provider thinks it is needed. When it is time for the port to come out, surgery will be   done to remove it. The procedure is similar to the one performed when the port was put in. When should I seek immediate medical care? When you have an implanted port, you should seek immediate medical care if:  You notice a bad smell coming from the incision site.  You have swelling, redness, or drainage at the incision site.  You have more swelling or pain at the port site or the surrounding area.  You have a fever that is not controlled with medicine.  This information is not intended to replace advice given to you by your health care provider. Make sure you discuss any questions you have with your health care provider. Document  Released: 08/28/2005 Document Revised: 02/03/2016 Document Reviewed: 05/05/2013 Elsevier Interactive Patient Education  2017 [employer]  

## 2017-11-16 LAB — TYPE AND SCREEN
ABO/RH(D): AB POS
Antibody Screen: NEGATIVE
UNIT DIVISION: 0
Unit division: 0

## 2017-11-16 LAB — BPAM RBC
BLOOD PRODUCT EXPIRATION DATE: 201903212359
Blood Product Expiration Date: 201903212359
ISSUE DATE / TIME: 201903060935
ISSUE DATE / TIME: 201903071523
Unit Type and Rh: 6200
Unit Type and Rh: 6200

## 2017-11-18 ENCOUNTER — Other Ambulatory Visit: Payer: Self-pay

## 2017-11-18 ENCOUNTER — Inpatient Hospital Stay (HOSPITAL_COMMUNITY)
Admission: EM | Admit: 2017-11-18 | Discharge: 2017-12-10 | DRG: 947 | Disposition: E | Payer: BLUE CROSS/BLUE SHIELD | Attending: Internal Medicine | Admitting: Internal Medicine

## 2017-11-18 ENCOUNTER — Encounter (HOSPITAL_COMMUNITY): Payer: Self-pay | Admitting: Emergency Medicine

## 2017-11-18 ENCOUNTER — Emergency Department (HOSPITAL_COMMUNITY): Payer: BLUE CROSS/BLUE SHIELD

## 2017-11-18 DIAGNOSIS — Z66 Do not resuscitate: Secondary | ICD-10-CM | POA: Diagnosis present

## 2017-11-18 DIAGNOSIS — E860 Dehydration: Secondary | ICD-10-CM | POA: Diagnosis present

## 2017-11-18 DIAGNOSIS — I959 Hypotension, unspecified: Secondary | ICD-10-CM | POA: Diagnosis not present

## 2017-11-18 DIAGNOSIS — N179 Acute kidney failure, unspecified: Secondary | ICD-10-CM | POA: Diagnosis present

## 2017-11-18 DIAGNOSIS — K5900 Constipation, unspecified: Secondary | ICD-10-CM | POA: Diagnosis present

## 2017-11-18 DIAGNOSIS — I129 Hypertensive chronic kidney disease with stage 1 through stage 4 chronic kidney disease, or unspecified chronic kidney disease: Secondary | ICD-10-CM | POA: Diagnosis present

## 2017-11-18 DIAGNOSIS — Z8509 Personal history of malignant neoplasm of other digestive organs: Secondary | ICD-10-CM

## 2017-11-18 DIAGNOSIS — D649 Anemia, unspecified: Secondary | ICD-10-CM | POA: Diagnosis not present

## 2017-11-18 DIAGNOSIS — B370 Candidal stomatitis: Secondary | ICD-10-CM

## 2017-11-18 DIAGNOSIS — E11649 Type 2 diabetes mellitus with hypoglycemia without coma: Secondary | ICD-10-CM | POA: Diagnosis not present

## 2017-11-18 DIAGNOSIS — K2210 Ulcer of esophagus without bleeding: Secondary | ICD-10-CM | POA: Diagnosis present

## 2017-11-18 DIAGNOSIS — Z6827 Body mass index (BMI) 27.0-27.9, adult: Secondary | ICD-10-CM

## 2017-11-18 DIAGNOSIS — Z9221 Personal history of antineoplastic chemotherapy: Secondary | ICD-10-CM

## 2017-11-18 DIAGNOSIS — C772 Secondary and unspecified malignant neoplasm of intra-abdominal lymph nodes: Secondary | ICD-10-CM | POA: Diagnosis present

## 2017-11-18 DIAGNOSIS — R52 Pain, unspecified: Secondary | ICD-10-CM

## 2017-11-18 DIAGNOSIS — Z8249 Family history of ischemic heart disease and other diseases of the circulatory system: Secondary | ICD-10-CM

## 2017-11-18 DIAGNOSIS — D500 Iron deficiency anemia secondary to blood loss (chronic): Secondary | ICD-10-CM | POA: Diagnosis present

## 2017-11-18 DIAGNOSIS — R1011 Right upper quadrant pain: Secondary | ICD-10-CM | POA: Diagnosis not present

## 2017-11-18 DIAGNOSIS — R7989 Other specified abnormal findings of blood chemistry: Secondary | ICD-10-CM

## 2017-11-18 DIAGNOSIS — G893 Neoplasm related pain (acute) (chronic): Secondary | ICD-10-CM | POA: Diagnosis not present

## 2017-11-18 DIAGNOSIS — C229 Malignant neoplasm of liver, not specified as primary or secondary: Secondary | ICD-10-CM | POA: Diagnosis not present

## 2017-11-18 DIAGNOSIS — I10 Essential (primary) hypertension: Secondary | ICD-10-CM | POA: Diagnosis not present

## 2017-11-18 DIAGNOSIS — Z794 Long term (current) use of insulin: Secondary | ICD-10-CM

## 2017-11-18 DIAGNOSIS — C787 Secondary malignant neoplasm of liver and intrahepatic bile duct: Secondary | ICD-10-CM | POA: Diagnosis present

## 2017-11-18 DIAGNOSIS — E785 Hyperlipidemia, unspecified: Secondary | ICD-10-CM | POA: Diagnosis present

## 2017-11-18 DIAGNOSIS — K7290 Hepatic failure, unspecified without coma: Secondary | ICD-10-CM | POA: Diagnosis present

## 2017-11-18 DIAGNOSIS — E1165 Type 2 diabetes mellitus with hyperglycemia: Secondary | ICD-10-CM | POA: Diagnosis not present

## 2017-11-18 DIAGNOSIS — Z87891 Personal history of nicotine dependence: Secondary | ICD-10-CM

## 2017-11-18 DIAGNOSIS — K8021 Calculus of gallbladder without cholecystitis with obstruction: Secondary | ICD-10-CM | POA: Diagnosis present

## 2017-11-18 DIAGNOSIS — Z833 Family history of diabetes mellitus: Secondary | ICD-10-CM

## 2017-11-18 DIAGNOSIS — C23 Malignant neoplasm of gallbladder: Secondary | ICD-10-CM | POA: Diagnosis present

## 2017-11-18 DIAGNOSIS — N182 Chronic kidney disease, stage 2 (mild): Secondary | ICD-10-CM | POA: Diagnosis present

## 2017-11-18 DIAGNOSIS — C799 Secondary malignant neoplasm of unspecified site: Secondary | ICD-10-CM | POA: Diagnosis not present

## 2017-11-18 DIAGNOSIS — I2510 Atherosclerotic heart disease of native coronary artery without angina pectoris: Secondary | ICD-10-CM | POA: Diagnosis present

## 2017-11-18 DIAGNOSIS — E440 Moderate protein-calorie malnutrition: Secondary | ICD-10-CM | POA: Diagnosis not present

## 2017-11-18 DIAGNOSIS — K269 Duodenal ulcer, unspecified as acute or chronic, without hemorrhage or perforation: Secondary | ICD-10-CM | POA: Diagnosis present

## 2017-11-18 DIAGNOSIS — F068 Other specified mental disorders due to known physiological condition: Secondary | ICD-10-CM | POA: Diagnosis present

## 2017-11-18 DIAGNOSIS — R590 Localized enlarged lymph nodes: Secondary | ICD-10-CM | POA: Diagnosis present

## 2017-11-18 DIAGNOSIS — D259 Leiomyoma of uterus, unspecified: Secondary | ICD-10-CM | POA: Diagnosis present

## 2017-11-18 DIAGNOSIS — K264 Chronic or unspecified duodenal ulcer with hemorrhage: Secondary | ICD-10-CM | POA: Diagnosis present

## 2017-11-18 DIAGNOSIS — R0603 Acute respiratory distress: Secondary | ICD-10-CM | POA: Diagnosis not present

## 2017-11-18 DIAGNOSIS — Z79899 Other long term (current) drug therapy: Secondary | ICD-10-CM

## 2017-11-18 DIAGNOSIS — Z8711 Personal history of peptic ulcer disease: Secondary | ICD-10-CM

## 2017-11-18 DIAGNOSIS — E1122 Type 2 diabetes mellitus with diabetic chronic kidney disease: Secondary | ICD-10-CM | POA: Diagnosis present

## 2017-11-18 DIAGNOSIS — Z515 Encounter for palliative care: Secondary | ICD-10-CM

## 2017-11-18 LAB — COMPREHENSIVE METABOLIC PANEL
ALT: 53 U/L (ref 14–54)
ANION GAP: 15 (ref 5–15)
AST: 93 U/L — ABNORMAL HIGH (ref 15–41)
Albumin: 1.9 g/dL — ABNORMAL LOW (ref 3.5–5.0)
Alkaline Phosphatase: 205 U/L — ABNORMAL HIGH (ref 38–126)
BUN: 36 mg/dL — ABNORMAL HIGH (ref 6–20)
CO2: 19 mmol/L — ABNORMAL LOW (ref 22–32)
CREATININE: 1.5 mg/dL — AB (ref 0.44–1.00)
Calcium: 8.8 mg/dL — ABNORMAL LOW (ref 8.9–10.3)
Chloride: 101 mmol/L (ref 101–111)
GFR, EST AFRICAN AMERICAN: 42 mL/min — AB (ref >60)
GFR, EST NON AFRICAN AMERICAN: 36 mL/min — AB (ref >60)
Glucose, Bld: 438 mg/dL — ABNORMAL HIGH (ref 65–99)
Potassium: 3.9 mmol/L (ref 3.5–5.1)
SODIUM: 135 mmol/L (ref 135–145)
Total Bilirubin: 2.6 mg/dL — ABNORMAL HIGH (ref 0.3–1.2)
Total Protein: 6.8 g/dL (ref 6.5–8.1)

## 2017-11-18 LAB — CBC WITH DIFFERENTIAL/PLATELET
BASOS ABS: 0 10*3/uL (ref 0.0–0.1)
BASOS PCT: 0 %
Band Neutrophils: 10 %
EOS PCT: 0 %
Eosinophils Absolute: 0 10*3/uL (ref 0.0–0.7)
HEMATOCRIT: 26.6 % — AB (ref 36.0–46.0)
Hemoglobin: 8.4 g/dL — ABNORMAL LOW (ref 12.0–15.0)
LYMPHS ABS: 0.7 10*3/uL (ref 0.7–4.0)
LYMPHS PCT: 7 %
MCH: 28.6 pg (ref 26.0–34.0)
MCHC: 31.6 g/dL (ref 30.0–36.0)
MCV: 90.5 fL (ref 78.0–100.0)
MONO ABS: 0.5 10*3/uL (ref 0.1–1.0)
Monocytes Relative: 5 %
NEUTROS PCT: 78 %
Neutro Abs: 8.8 10*3/uL — ABNORMAL HIGH (ref 1.7–7.7)
PLATELETS: 276 10*3/uL (ref 150–400)
RBC: 2.94 MIL/uL — AB (ref 3.87–5.11)
RDW: 18.3 % — AB (ref 11.5–15.5)
WBC: 10 10*3/uL (ref 4.0–10.5)

## 2017-11-18 LAB — TROPONIN I: Troponin I: <0.03 ng/mL (ref <0.03)

## 2017-11-18 LAB — GLUCOSE, CAPILLARY
GLUCOSE-CAPILLARY: 367 mg/dL — AB (ref 65–99)
Glucose-Capillary: 346 mg/dL — ABNORMAL HIGH (ref 65–99)
Glucose-Capillary: 239 mg/dL — ABNORMAL HIGH (ref 65–99)

## 2017-11-18 LAB — LIPASE, BLOOD: LIPASE: 15 U/L (ref 11–51)

## 2017-11-18 LAB — BRAIN NATRIURETIC PEPTIDE: B NATRIURETIC PEPTIDE 5: 505.8 pg/mL — AB (ref 0.0–100.0)

## 2017-11-18 MED ORDER — LATANOPROST 0.005 % OP SOLN
1.0000 [drp] | Freq: Every day | OPHTHALMIC | Status: DC
  Administered 2017-11-18 – 2017-11-20 (×3): 1 [drp] via OPHTHALMIC
  Filled 2017-11-18: qty 2.5

## 2017-11-18 MED ORDER — HYDROCODONE-ACETAMINOPHEN 5-325 MG PO TABS
2.0000 | ORAL_TABLET | Freq: Four times a day (QID) | ORAL | Status: DC | PRN
  Administered 2017-11-21: 2 via ORAL
  Filled 2017-11-18: qty 2

## 2017-11-18 MED ORDER — INSULIN ASPART 100 UNIT/ML ~~LOC~~ SOLN
0.0000 [IU] | Freq: Every day | SUBCUTANEOUS | Status: DC
  Administered 2017-11-18 – 2017-11-19 (×2): 2 [IU] via SUBCUTANEOUS

## 2017-11-18 MED ORDER — HYDROMORPHONE HCL 1 MG/ML IJ SOLN
1.0000 mg | INTRAMUSCULAR | Status: DC | PRN
  Administered 2017-11-18: 1 mg via INTRAVENOUS

## 2017-11-18 MED ORDER — PANTOPRAZOLE SODIUM 40 MG PO TBEC
40.0000 mg | DELAYED_RELEASE_TABLET | Freq: Every day | ORAL | Status: DC
  Administered 2017-11-19 – 2017-11-21 (×4): 40 mg via ORAL
  Filled 2017-11-18 (×4): qty 1

## 2017-11-18 MED ORDER — URSODIOL 300 MG PO CAPS
300.0000 mg | ORAL_CAPSULE | Freq: Three times a day (TID) | ORAL | Status: DC
  Administered 2017-11-18 – 2017-11-21 (×7): 300 mg via ORAL
  Filled 2017-11-18 (×11): qty 1

## 2017-11-18 MED ORDER — HYDROMORPHONE HCL 1 MG/ML IJ SOLN
1.0000 mg | Freq: Once | INTRAMUSCULAR | Status: AC
  Administered 2017-11-18: 1 mg via INTRAVENOUS
  Filled 2017-11-18: qty 1

## 2017-11-18 MED ORDER — SODIUM CHLORIDE 0.9 % IV BOLUS (SEPSIS)
1000.0000 mL | Freq: Once | INTRAVENOUS | Status: AC
  Administered 2017-11-18: 1000 mL via INTRAVENOUS

## 2017-11-18 MED ORDER — POLYETHYLENE GLYCOL 3350 17 G PO PACK
17.0000 g | PACK | Freq: Every day | ORAL | Status: DC
  Administered 2017-11-19 – 2017-11-20 (×2): 17 g via ORAL
  Filled 2017-11-18 (×3): qty 1

## 2017-11-18 MED ORDER — CARVEDILOL 25 MG PO TABS
25.0000 mg | ORAL_TABLET | Freq: Two times a day (BID) | ORAL | Status: DC
  Administered 2017-11-19 – 2017-11-21 (×5): 25 mg via ORAL
  Filled 2017-11-18 (×5): qty 1

## 2017-11-18 MED ORDER — ISOSORBIDE MONONITRATE ER 60 MG PO TB24
60.0000 mg | ORAL_TABLET | ORAL | Status: DC
  Administered 2017-11-19 – 2017-11-21 (×3): 60 mg via ORAL
  Filled 2017-11-18 (×4): qty 1

## 2017-11-18 MED ORDER — HYDROMORPHONE HCL 1 MG/ML IJ SOLN
INTRAMUSCULAR | Status: AC
  Filled 2017-11-18: qty 1

## 2017-11-18 MED ORDER — HYDROCODONE-ACETAMINOPHEN 5-325 MG PO TABS: 0.5000 | ORAL_TABLET | Freq: Four times a day (QID) | ORAL | Status: DC | PRN

## 2017-11-18 MED ORDER — HYDROMORPHONE HCL 1 MG/ML IJ SOLN
1.0000 mg | INTRAMUSCULAR | Status: DC | PRN
  Administered 2017-11-18 – 2017-11-19 (×4): 2 mg via INTRAVENOUS
  Filled 2017-11-18 (×4): qty 2

## 2017-11-18 MED ORDER — ONDANSETRON HCL 4 MG/2ML IJ SOLN
4.0000 mg | Freq: Four times a day (QID) | INTRAMUSCULAR | Status: DC | PRN
Start: 2017-11-18 — End: 2017-11-21
  Administered 2017-11-18: 4 mg via INTRAVENOUS
  Filled 2017-11-18 (×2): qty 2

## 2017-11-18 MED ORDER — ACETAMINOPHEN 650 MG RE SUPP: 650.0000 mg | Freq: Four times a day (QID) | RECTAL | Status: DC | PRN

## 2017-11-18 MED ORDER — INSULIN GLARGINE 100 UNIT/ML ~~LOC~~ SOLN
25.0000 [IU] | Freq: Two times a day (BID) | SUBCUTANEOUS | Status: DC
  Administered 2017-11-18 – 2017-11-20 (×6): 25 [IU] via SUBCUTANEOUS
  Filled 2017-11-18 (×7): qty 0.25

## 2017-11-18 MED ORDER — ONDANSETRON HCL 4 MG/2ML IJ SOLN
4.0000 mg | Freq: Once | INTRAMUSCULAR | Status: AC
  Administered 2017-11-18: 4 mg via INTRAVENOUS
  Filled 2017-11-18: qty 2

## 2017-11-18 MED ORDER — ONDANSETRON HCL 4 MG PO TABS: 4.0000 mg | ORAL_TABLET | Freq: Four times a day (QID) | ORAL | Status: DC | PRN

## 2017-11-18 MED ORDER — SENNOSIDES-DOCUSATE SODIUM 8.6-50 MG PO TABS
1.0000 | ORAL_TABLET | Freq: Two times a day (BID) | ORAL | Status: DC
  Administered 2017-11-18 – 2017-11-20 (×5): 1 via ORAL
  Filled 2017-11-18 (×6): qty 1

## 2017-11-18 MED ORDER — INSULIN ASPART 100 UNIT/ML ~~LOC~~ SOLN
0.0000 [IU] | Freq: Three times a day (TID) | SUBCUTANEOUS | Status: DC
  Administered 2017-11-18: 7 [IU] via SUBCUTANEOUS
  Administered 2017-11-19: 1 [IU] via SUBCUTANEOUS
  Administered 2017-11-19: 2 [IU] via SUBCUTANEOUS

## 2017-11-18 MED ORDER — ENOXAPARIN SODIUM 40 MG/0.4ML ~~LOC~~ SOLN: 40.0000 mg | SUBCUTANEOUS | Status: DC

## 2017-11-18 MED ORDER — ACETAMINOPHEN 325 MG PO TABS: 650.0000 mg | ORAL_TABLET | Freq: Four times a day (QID) | ORAL | Status: DC | PRN

## 2017-11-18 NOTE — ED Triage Notes (Signed)
Pt from home c/o SOB and uncontrolled pain. 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 Pt received chemo 5 days ago. Pt family reports receiving 2 units of blood this week as well. Pt reports pain in R side into back 9/10. Pt has taken 1-5/325 hydrocodone and 50 mg tramadol with no relief. Pt is A&O and in NAD

## 2017-11-18 NOTE — ED Provider Notes (Signed)
[facility] DEPT Provider Note   CSN: [id number] Arrival date & time: 12/05/2017  2725     History   Chief Complaint Chief Complaint  Patient presents with  . Shortness of Breath  . Pain control    HPI Julie Barker is a 62 y.o. female.  Pt presents to the ED today with sob and with severe pain.  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  Biopsy on 09/21/17 confirmed adenocarcinoma.   The pt's therapy has been complicated by biliary obstruction (ERCP placed stent to right hepatic duct 10/30/17), sepsis (11/02/17), and gi bleed (2/7 EGD showed duodenal ulcer bleeding).  The pt is followed by Dr. Benay Spice.  She had her first cycle of FOLFOX on 11/13/17.  She also received prbcs on 3/6 and 3/7.  The pt's pain to her right side has been out of control at home.  Pt said it's worse when she moves.  She also gets very sob with movement.  She has not been eating very well either.  She has had bowel movements.        Past Medical History:  Diagnosis Date  . Coronary artery disease    has diffuse distal vessel disease. Managed medically  . Diabetes mellitus   . Duodenal ulcer: Per EGD 10/18/2017 10/18/2017  . Esophageal ulceration: EGD 10/18/2017 10/18/2017  . Fibroid tumor   . Hyperlipidemia   . Hypertension   . Liver cancer ([place])   . Primary cancer of gallbladder with metastasis to other site [facility])   . Type 2 diabetes mellitus without complication ([place]) 11/15/6438    Patient Active Problem List   Diagnosis Date Noted  . Port-A-Cath in place 11/13/2017  . Sepsis associated hypotension ([place]) 11/02/2017  . Septic shock ([place]) 11/02/2017  . Gallbladder cancer ([place]) 10/22/2017  . Goals of care, counseling/discussion 10/22/2017  . Acute lower UTI 10/20/2017  . Symptomatic anemia 10/18/2017  . Duodenal ulcer: Per EGD 10/18/2017 10/18/2017  . Esophageal ulceration: EGD 10/18/2017 10/18/2017  . Type 2 diabetes mellitus without  complication ([place]) 34/74/2595  . Hyponatremia 10/18/2017  . Anemia 10/17/2017  . Liver cancer ([place])   . CAD (coronary artery disease) 08/02/2011  . HTN (hypertension) 08/02/2011  . Hyperlipidemia 08/02/2011    Past Surgical History:  Procedure Laterality Date  . CARDIAC CATHETERIZATION  04/05/2007   EF 75%.MODERATE 3 VESSEL CAD  . CARDIOVASCULAR STRESS TEST  02/28/2007   EF 53%. NO ISCHEMIA  . COLONOSCOPY WITH PROPOFOL N/A 05/31/2015   Procedure: COLONOSCOPY WITH PROPOFOL;  Surgeon: Garlan Fair, MD;  Location: [facility] ENDOSCOPY;  Service: Endoscopy;  Laterality: N/A;  . ERCP N/A 10/30/2017   Procedure: ENDOSCOPIC RETROGRADE CHOLANGIOPANCREATOGRAPHY (ERCP);  Surgeon: Clarene Essex, MD;  Location: [facility];  Service: Endoscopy;  Laterality: N/A;  . ESOPHAGOGASTRODUODENOSCOPY (EGD) WITH PROPOFOL Left 10/18/2017   Procedure: ESOPHAGOGASTRODUODENOSCOPY (EGD) WITH PROPOFOL;  Surgeon: Ronnette Juniper, MD;  Location: [facility]OS[facility];  Service: Gastroenterology;  Laterality: Left;  . IR FLUORO GUIDE PORT INSERTION RIGHT  10/29/2017  . IR US GUIDE VASC ACCESS RIGHT  10/29/2017  . MOUTH SURGERY      OB History    No data available       Home Medications    Prior to Admission medications   Medication Sig Start Date End Date Taking? Authorizing Provider  carvedilol (COREG) 25 MG tablet TAKE ONE TABLET BY MOUTH TWICE DAILY WITH MEALS Patient taking differently: TAKE 25 MG BY MOUTH TWICE DAILY WITH  MEALS 05/23/12  Yes Burtis Junes, NP  HYDROcodone-acetaminophen (NORCO/VICODIN) 5-325 MG tablet Take 0.5-1 tablets by mouth every 6 (six) hours as needed for severe pain. 11/15/17  Yes Ladell Pier, MD  insulin aspart (NOVOLOG FLEXPEN) 100 UNIT/ML FlexPen Inject 2-10 Units into the skin 2 (two) times daily. Per sliding scale   Yes [provider]  insulin glargine (LANTUS) 100 UNIT/ML injection Inject 25 Units into the skin 2 (two) times daily.    Yes [provider]  isosorbide  mononitrate (IMDUR) 60 MG 24 hr tablet Take 60 mg by mouth every morning.    Yes [provider]  latanoprost (XALATAN) 0.005 % ophthalmic solution Place 1 drop into both eyes at bedtime.    Yes [provider]  lidocaine-prilocaine (EMLA) cream Apply 1hr prior to port use. Do not rub in. Cover with plastic wrap. 11/13/17  Yes Ladell Pier, MD  ondansetron (ZOFRAN) 8 MG tablet Take 1 tablet (8 mg total) by mouth every 8 (eight) hours as needed for nausea or vomiting. 11/01/17  Yes Ladell Pier, MD  ondansetron (ZOFRAN-ODT) 4 MG disintegrating tablet Take 4 mg by mouth every 6 (six) hours as needed for nausea or vomiting. 10/16/17  Yes [provider]  pantoprazole (PROTONIX) 40 MG tablet Take 1 tablet (40 mg total) by mouth 2 (two) times daily before a meal. 10/20/17  Yes Eugenie Filler, MD  polyethylene glycol (MIRALAX / GLYCOLAX) packet Take 17 g by mouth daily. 10/21/17  Yes Eugenie Filler, MD  traMADol (ULTRAM) 50 MG tablet Take 1 tablet (50 mg total) by mouth every 12 (twelve) hours as needed for moderate pain or severe pain. 11/01/17  Yes Ladell Pier, MD  ursodiol (ACTIGALL) 300 MG capsule Take 1 capsule (300 mg total) by mouth 3 (three) times daily. 10/20/17  Yes Eugenie Filler, MD  colchicine 0.6 MG tablet Take 0.6 mg by mouth 2 (two) times daily as needed. At the start of gout symptoms    [provider]  prochlorperazine (COMPAZINE) 10 MG tablet Take 1 tablet (10 mg total) by mouth every 6 (six) hours as needed for nausea or vomiting. 10/25/17   Ladell Pier, MD    Family History Family History  Problem Relation Age of Onset  . Aneurysm Mother   . Heart attack Father   . Stroke Father   . Hypertension Sister   . Diabetes Sister   . Rheumatic fever Sister   . Hypertension Brother   . Hypertension Brother   . Hypertension Brother   . Hypertension Sister   . Diabetes Sister     Social History Social History   Tobacco Use  .  Smoking status: Former Smoker    Last attempt to quit: 09/11/1998    Years since quitting: 19.2  . Smokeless tobacco: Never Used  Substance Use Topics  . Alcohol use: No  . Drug use: No     Allergies   Patient has no known allergies.   Review of Systems Review of Systems  Respiratory: Positive for shortness of breath.   Gastrointestinal: Positive for abdominal pain and nausea.  All other systems reviewed and are negative.    Physical Exam Updated Vital Signs BP (!) 119/53   Pulse 72   Resp (!) 27   SpO2 100%   Physical Exam  Constitutional: She is oriented to person, place, and time. She appears well-developed. She appears ill.  HENT:  Head: Normocephalic and atraumatic.  Mouth/Throat: Oropharynx is clear and moist.  Eyes: EOM are normal. Pupils are equal, round, and reactive to light. Scleral icterus is present.  Neck: Normal range of motion. Neck supple.  Cardiovascular: Normal rate, regular rhythm, normal heart sounds and intact distal pulses.  Pulmonary/Chest: Effort normal and breath sounds normal.  Abdominal: Soft. Bowel sounds are normal. She exhibits fluid wave. There is tenderness in the right upper quadrant.  Musculoskeletal: Normal range of motion.       Right lower leg: Normal.       Left lower leg: Normal.  Neurological: She is alert and oriented to person, place, and time.  Skin: Skin is warm and dry. Capillary refill takes less than 2 seconds.  Psychiatric: She has a normal mood and affect. Her behavior is normal.  Nursing note and vitals reviewed.    ED Treatments / Results  Labs (all labs ordered are listed, but only abnormal results are displayed) Labs Reviewed  COMPREHENSIVE METABOLIC PANEL - Abnormal; Notable for the following components:      Result Value   CO2 19 (*)    Glucose, Bld 438 (*)    BUN 36 (*)    Creatinine, Ser 1.50 (*)    Calcium 8.8 (*)    Albumin 1.9 (*)    AST 93 (*)    Alkaline Phosphatase 205 (*)    Total Bilirubin  2.6 (*)    GFR calc non Af Amer 36 (*)    GFR calc Af Amer 42 (*)    All other components within normal limits  CBC WITH DIFFERENTIAL/PLATELET - Abnormal; Notable for the following components:   RBC 2.94 (*)    Hemoglobin 8.4 (*)    HCT 26.6 (*)    RDW 18.3 (*)    Neutro Abs 8.8 (*)    All other components within normal limits  BRAIN NATRIURETIC PEPTIDE - Abnormal; Notable for the following components:   B Natriuretic Peptide 505.8 (*)    All other components within normal limits  TROPONIN I  LIPASE, BLOOD  URINALYSIS, ROUTINE W REFLEX MICROSCOPIC    EKG  EKG Interpretation None       Radiology Dg Chest 2 View  Result Date: 11/17/2017 CLINICAL DATA:  Shortness of breath.  Metastatic gallbladder cancer. EXAM: CHEST - 2 VIEW COMPARISON:  11/02/2017 FINDINGS: Lateral view degraded by patient arm position and poor inspiratory effort. The frontal radiograph is also of low lung volumes. Port-A-Cath terminates at the low SVC. Right hemidiaphragm elevation. Midline trachea. Cardiomegaly accentuated by AP portable technique. No pleural effusion or pneumothorax. Interstitial prominence is favored to be due to AP portable technique and low lung volumes. Mild right infrahilar and medial lower lobe atelectasis. No free intraperitoneal air. IMPRESSION: Low lung volumes with mild right-sided volume loss and atelectasis. Electronically Signed   By: Abigail Miyamoto M.D.   On: 12/04/2017 11:10    Procedures Procedures (including critical care time)  Medications Ordered in ED Medications  HYDROmorphone (DILAUDID) injection 1 mg (1 mg Intravenous Given 11/25/2017 1050)  ondansetron (ZOFRAN) injection 4 mg (4 mg Intravenous Given 11/16/2017 1050)  sodium chloride 0.9 % bolus 1,000 mL (1,000 mLs Intravenous New Bag/Given 11/24/2017 1054)     Initial Impression / Assessment and Plan / ED Course  I have reviewed the triage vital signs and the nursing notes.  Pertinent labs & imaging results that were  available during my care of the patient were reviewed by me and considered in my medical decision making (see  chart for details).    Pain has improved.  Pt is worried that her pain will not be improved at home.  She was d/w Dr. Karleen Hampshire ([facility]) who will admit for pain control.   Final Clinical Impressions(s) / ED Diagnoses   Final diagnoses:  Metastatic adenocarcinoma ([place])  Intractable pain  Poorly controlled type 2 diabetes mellitus ([place])  Chronic anemia    ED Discharge Orders    None       Isla Pence, MD 11/10/2017 1221

## 2017-11-18 NOTE — H&P (Addendum)
History and Physical    Julie Barker IEP:[id number] DOB: Oct 10, 1955 DOA: 12/03/2017  PCP: Wenda Low, MD  Patient coming from: Home.   I have personally briefly reviewed patient's old medical records in [place]  Chief Complaint:  Right upper quadrant pain since 3 days.   HPI: Julie Barker is a 62 y.o. female with medical history significant of metastatic gallbladder carcinoma, status post chemo on 5 March by Dr. Benay Spice, chronic anemia history of GI bleed, comes in today for persistent right quadrant abdominal pain associated with nausea and vomiting since 3-4 days.  She was started on hydrocodone without much relief.  She was seen in the ED today and was given IV Dilaudid with minimal pain relief.  She denies any chest pain shortness of breath fever, diarrhea syncope headache tingling or numbness.  She also reports that her last bowel movement was on Friday.  She was referred to medical service for admission for pain control   Review of Systems: As per HPI otherwise "All others reviewed and are negative.   Past Medical History:  Diagnosis Date  . Coronary artery disease    has diffuse distal vessel disease. Managed medically  . Diabetes mellitus   . Duodenal ulcer: Per EGD 10/18/2017 10/18/2017  . Esophageal ulceration: EGD 10/18/2017 10/18/2017  . Fibroid tumor   . Hyperlipidemia   . Hypertension   . Liver cancer ([place])   . Primary cancer of gallbladder with metastasis to other site [facility])   . Type 2 diabetes mellitus without complication ([place]) 02/14/629    Past Surgical History:  Procedure Laterality Date  . CARDIAC CATHETERIZATION  04/05/2007   EF 75%.MODERATE 3 VESSEL CAD  . CARDIOVASCULAR STRESS TEST  02/28/2007   EF 53%. NO ISCHEMIA  . COLONOSCOPY WITH PROPOFOL N/A 05/31/2015   Procedure: COLONOSCOPY WITH PROPOFOL;  Surgeon: Garlan Fair, MD;  [facility];  Service: Endoscopy;  Laterality: N/A;  . ERCP N/A 10/30/2017   Procedure: ENDOSCOPIC  RETROGRADE CHOLANGIOPANCREATOGRAPHY (ERCP);  Surgeon: Clarene Essex, MD;  Location: [facility];  Service: Endoscopy;  Laterality: N/A;  . ESOPHAGOGASTRODUODENOSCOPY (EGD) WITH PROPOFOL Left 10/18/2017   Procedure: ESOPHAGOGASTRODUODENOSCOPY (EGD) WITH PROPOFOL;  Surgeon: Ronnette Juniper, MD;  Location: [facility];  Service: Gastroenterology;  Laterality: Left;  . IR FLUORO GUIDE PORT INSERTION RIGHT  10/29/2017  . IR US GUIDE VASC ACCESS RIGHT  10/29/2017  . MOUTH SURGERY       reports that she quit smoking about 19 years ago. she has never used smokeless tobacco. She reports that she does not drink alcohol or use drugs.  No Known Allergies  Family History  Problem Relation Age of Onset  . Aneurysm Mother   . Heart attack Father   . Stroke Father   . Hypertension Sister   . Diabetes Sister   . Rheumatic fever Sister   . Hypertension Brother   . Hypertension Brother   . Hypertension Brother   . Hypertension Sister   . Diabetes Sister    Family history reviewed and not pertinent.   Prior to Admission medications   Medication Sig Start Date End Date Taking? Authorizing Provider  carvedilol (COREG) 25 MG tablet TAKE ONE TABLET BY MOUTH TWICE DAILY WITH MEALS Patient taking differently: TAKE 25 MG BY MOUTH TWICE DAILY WITH MEALS 05/23/12  Yes Burtis Junes, NP  HYDROcodone-acetaminophen (NORCO/VICODIN) 5-325 MG tablet Take 0.5-1 tablets by mouth every 6 (six) hours as needed for severe pain. 11/15/17  Yes Betsy Coder  B, MD  insulin aspart (NOVOLOG FLEXPEN) 100 UNIT/ML FlexPen Inject 2-10 Units into the skin 2 (two) times daily. Per sliding scale   Yes [provider]  insulin glargine (LANTUS) 100 UNIT/ML injection Inject 25 Units into the skin 2 (two) times daily.    Yes [provider]  isosorbide mononitrate (IMDUR) 60 MG 24 hr tablet Take 60 mg by mouth every morning.    Yes [provider]  latanoprost (XALATAN) 0.005 % ophthalmic solution Place 1 drop into  both eyes at bedtime.    Yes [provider]  lidocaine-prilocaine (EMLA) cream Apply 1hr prior to port use. Do not rub in. Cover with plastic wrap. 11/13/17  Yes Ladell Pier, MD  ondansetron (ZOFRAN) 8 MG tablet Take 1 tablet (8 mg total) by mouth every 8 (eight) hours as needed for nausea or vomiting. 11/01/17  Yes Ladell Pier, MD  ondansetron (ZOFRAN-ODT) 4 MG disintegrating tablet Take 4 mg by mouth every 6 (six) hours as needed for nausea or vomiting. 10/16/17  Yes [provider]  pantoprazole (PROTONIX) 40 MG tablet Take 1 tablet (40 mg total) by mouth 2 (two) times daily before a meal. 10/20/17  Yes Eugenie Filler, MD  polyethylene glycol (MIRALAX / GLYCOLAX) packet Take 17 g by mouth daily. 10/21/17  Yes Eugenie Filler, MD  traMADol (ULTRAM) 50 MG tablet Take 1 tablet (50 mg total) by mouth every 12 (twelve) hours as needed for moderate pain or severe pain. 11/01/17  Yes Ladell Pier, MD  ursodiol (ACTIGALL) 300 MG capsule Take 1 capsule (300 mg total) by mouth 3 (three) times daily. 10/20/17  Yes Eugenie Filler, MD  colchicine 0.6 MG tablet Take 0.6 mg by mouth 2 (two) times daily as needed. At the start of gout symptoms    [provider]  prochlorperazine (COMPAZINE) 10 MG tablet Take 1 tablet (10 mg total) by mouth every 6 (six) hours as needed for nausea or vomiting. 10/25/17   Ladell Pier, MD    Physical Exam: Alert mild to moderate distress from pain Vitals:   11/20/2017 1245 12/04/2017 1300 11/13/2017 1315 11/15/2017 1350  BP:  118/62  121/64  Pulse: 72 72 73 77  Resp: (!) 28 (!) 26 (!) 26 (!) 21  Temp:    98.6 F (37 C)  TempSrc:    Oral  SpO2: 100% 100% 100% 98%  Weight:    81 kg (178 lb 9.6 oz)  Height:    5\' 8"  (1.727 m)    Constitutional: NAD, calm, comfortable Vitals:   11/16/2017 1245 11/16/2017 1300 12/08/2017 1315 11/16/2017 1350  BP:  118/62  121/64  Pulse: 72 72 73 77  Resp: (!) 28 (!) 26 (!) 26 (!) 21  Temp:    98.6 F (37 C)   TempSrc:    Oral  SpO2: 100% 100% 100% 98%  Weight:    81 kg (178 lb 9.6 oz)  Height:    5\' 8"  (1.727 m)   Eyes: PERRL, lids and conjunctivae normal ENMT: Mucous membranes are moist. Posterior pharynx clear of any exudate or lesions.Normal dentition.  Neck: normal, supple, no masses, no thyromegaly Respiratory: clear to auscultation bilaterally, no wheezing, no crackles. Normal respiratory effort. No accessory muscle use.  Cardiovascular: Regular rate and rhythm, no murmurs / rubs / gallops. No extremity edema. 2+ pedal pulses. No carotid bruits.  Abdomen: Moderate to severe right quadrant abdominal pain, no signs of peritonitis.  Mildly distended with  bowel sounds positive.  Musculoskeletal: no clubbing / cyanosis. No joint deformity upper and lower extremities.  Skin: no rashes, lesions, ulcers. No induration Neurologic: CN 2-12 grossly intact. Sensation intact,  Strength 5/5 in all 4.  Psychiatric: Normal judgment and insight. Alert and oriented x 3. Normal mood.     Labs on Admission: I have personally reviewed following labs and imaging studies  CBC: Recent Labs  Lab 11/13/17 0901 11/20/2017 1030  WBC 14.8* 10.0  NEUTROABS 12.4* 8.8*  HGB  --  8.4*  HCT 22.2* 26.6*  MCV 90.4 90.5  PLT 318 678   Basic Metabolic Panel: Recent Labs  Lab 11/13/17 0901 11/15/2017 1030  NA 133* 135  K 3.6 3.9  CL 100 101  CO2 23 19*  GLUCOSE 293* 438*  BUN 15 36*  CREATININE 1.31* 1.50*  CALCIUM 9.1 8.8*   GFR: Estimated Creatinine Clearance: 42.8 mL/min (A) (by C-G formula based on SCr of 1.5 mg/dL (H)). Liver Function Tests: Recent Labs  Lab 11/13/17 0901 11/16/2017 1030  AST 27 93*  ALT 20 53  ALKPHOS 287* 205*  BILITOT 2.9* 2.6*  PROT 6.8 6.8  ALBUMIN 1.6* 1.9*   Recent Labs  Lab 12/09/2017 1039  LIPASE 15   No results for input(s): AMMONIA in the last 168 hours. Coagulation Profile: No results for input(s): INR, PROTIME in the last 168 hours. Cardiac Enzymes: Recent  Labs  Lab 11/15/2017 1030  TROPONINI <0.03   BNP (last 3 results) No results for input(s): PROBNP in the last 8760 hours. HbA1C: No results for input(s): HGBA1C in the last 72 hours. CBG: Recent Labs  Lab 11/20/2017 1401  GLUCAP 367*   Lipid Profile: No results for input(s): CHOL, HDL, LDLCALC, TRIG, CHOLHDL, LDLDIRECT in the last 72 hours. Thyroid Function Tests: No results for input(s): TSH, T4TOTAL, FREET4, T3FREE, THYROIDAB in the last 72 hours. Anemia Panel: No results for input(s): VITAMINB12, FOLATE, FERRITIN, TIBC, IRON, RETICCTPCT in the last 72 hours. Urine analysis:    Component Value Date/Time   COLORURINE AMBER (A) 11/02/2017 0600   APPEARANCEUR HAZY (A) 11/02/2017 0600   LABSPEC 1.017 11/02/2017 0600   PHURINE 5.0 11/02/2017 0600   GLUCOSEU 50 (A) 11/02/2017 0600   HGBUR SMALL (A) 11/02/2017 0600   BILIRUBINUR SMALL (A) 11/02/2017 0600   KETONESUR NEGATIVE 11/02/2017 0600   PROTEINUR 100 (A) 11/02/2017 0600   NITRITE NEGATIVE 11/02/2017 0600   LEUKOCYTESUR TRACE (A) 11/02/2017 0600    Radiological Exams on Admission: Dg Chest 2 View  Result Date: 12/09/2017 CLINICAL DATA:  Shortness of breath.  Metastatic gallbladder cancer. EXAM: CHEST - 2 VIEW COMPARISON:  11/02/2017 FINDINGS: Lateral view degraded by patient arm position and poor inspiratory effort. The frontal radiograph is also of low lung volumes. Port-A-Cath terminates at the low SVC. Right hemidiaphragm elevation. Midline trachea. Cardiomegaly accentuated by AP portable technique. No pleural effusion or pneumothorax. Interstitial prominence is favored to be due to AP portable technique and low lung volumes. Mild right infrahilar and medial lower lobe atelectasis. No free intraperitoneal air. IMPRESSION: Low lung volumes with mild right-sided volume loss and atelectasis. Electronically Signed   By: Abigail Miyamoto M.D.   On: 11/20/2017 11:10    EKG: Independently reviewed.  Assessment/Plan Active Problems:    HTN (hypertension)   Liver cancer (H[facility])   Anemia   Symptomatic anemia   Duodenal ulcer: Per EGD 10/18/2017   Esophageal ulceration: EGD 10/18/2017   Type 2 diabetes mellitus without complication ([place])   Gallbladder  cancer ([place])   Inadequate pain control   Uncontrolled right upper quadrant pain probably from the metastatic gallbladder carcinoma. Pain control with IV Dilaudid and hydrocodone.  Fentanyl patch will be added on pain still persistent. Stool softeners with senna Colace and MiraLAX.  Dr. Benay Spice  added to the treatment team. Ultrasound of the right quadrant abdomen for further evaluation.  Traumatic management with IV fluids IV pain medication IV antiemetics.   Metastatic gallbladder adenocarcinoma Further management as per oncology. Patient's bilirubin stable around 2.6.   Acute on stage II CKD creatinine slightly worse than baseline currently creatinine is 1.5.  Probably from decreased p.o. intake from abdominal pain. Gently hydrate and repeat renal parameters in the morning.   Anemia from duodenal ulcer per EGD on 10/18/2017 Status post 2 units of PRBC transfusion recently. Hemoglobin stable around 8.4.  Transfuse to keep hemoglobin greater than 7.  Monitor counts serially. Add PPI.    Type 2 diabetes mellitus Resume home dose insulin and SSI.   Hypertension Suboptimal possibly from right upper quadrant pain.    DVT prophylaxis: scd's Code Status: Full code Family Communication: Multiple family members at bedside. Disposition Plan: Pending resolution or improvement of the right upper quadrant pain  consults called: Dr. Sherrill/oncology added to the treatment team.  Admission status: obs / med surg.    Hosie Poisson MD [facility] Pager [phone]  If 7PM-7AM, please contact night-coverage [email or web address] Password [facility]  12/01/2017, 3:34 PM

## 2017-11-18 NOTE — ED Notes (Signed)
ED TO INPATIENT HANDOFF REPORT  Name/Age/Gender Julie Barker 62 y.o. female  Code Status Code Status History    Date Active Date Inactive Code Status Order ID Comments User Context   11/02/2017 06:50 11/05/2017 20:34 Full Code [id number]  Reyne Dumas, MD ED   10/17/2017 17:28 10/20/2017 21:50 Full Code [id number]  Florencia Reasons, MD Inpatient      Home/SNF/Other Home  Chief Complaint ca pt/shob pain  Level of Care/Admitting Diagnosis ED Disposition    ED Disposition Condition [place]: [place] [[id number]]  Level of Care: Med-Surg [16]  Diagnosis: Inadequate pain control [481856]  Admitting Physician: Hosie Poisson [[id number]]  Attending Physician: Hosie Poisson [[id number]]  PT Class (Do Not Modify): Observation [104]  PT Acc Code (Do Not Modify): Observation [10022]       Medical History Past Medical History:  Diagnosis Date  . Coronary artery disease    has diffuse distal vessel disease. Managed medically  . Diabetes mellitus   . Duodenal ulcer: Per EGD 10/18/2017 10/18/2017  . Esophageal ulceration: EGD 10/18/2017 10/18/2017  . Fibroid tumor   . Hyperlipidemia   . Hypertension   . Liver cancer ([place])   . Primary cancer of gallbladder with metastasis to other site [facility])   . Type 2 diabetes mellitus without complication ([place]) 11/09/4968    Allergies No Known Allergies  IV Location/Drains/Wounds Patient Lines/Drains/Airways Status   Active Line/Drains/Airways    Name:   Placement date:   Placement time:   Site:   Days:   Implanted Port 10/29/17 Right Chest   10/29/17    1200    Chest   20   GI Stent 5 Fr.   10/30/17    1056    -   19   GI Stent 10 Fr.   10/30/17    1057    -   19          Labs/Imaging Results for orders placed or performed during the hospital encounter of 11/16/2017 (from the past 48 hour(s))  Comprehensive metabolic panel     Status: Abnormal   Collection Time: 11/30/2017 10:30 AM  Result Value Ref Range   Sodium 135 135 - 145 mmol/L   Potassium 3.9 3.5 - 5.1 mmol/L   Chloride 101 101 - 111 mmol/L   CO2 19 (L) 22 - 32 mmol/L   Glucose, Bld 438 (H) 65 - 99 mg/dL   BUN 36 (H) 6 - 20 mg/dL   Creatinine, Ser 1.50 (H) 0.44 - 1.00 mg/dL   Calcium 8.8 (L) 8.9 - 10.3 mg/dL   Total Protein 6.8 6.5 - 8.1 g/dL   Albumin 1.9 (L) 3.5 - 5.0 g/dL   AST 93 (H) 15 - 41 U/L   ALT 53 14 - 54 U/L   Alkaline Phosphatase 205 (H) 38 - 126 U/L   Total Bilirubin 2.6 (H) 0.3 - 1.2 mg/dL   GFR calc non Af Amer 36 (L) >60 mL/min   GFR calc Af Amer 42 (L) >60 mL/min    Comment: (NOTE) The eGFR has been calculated using the CKD EPI equation. This calculation has not been validated in all clinical situations. eGFR's persistently <60 mL/min signify possible Chronic Kidney Disease.    Anion gap 15 5 - 15    Comment: Performed at [facility], [place] [address] [place], [place] [address]  CBC WITH DIFFERENTIAL     Status: Abnormal   Collection Time: 11/24/2017 10:30  AM  Result Value Ref Range   WBC 10.0 4.0 - 10.5 K/uL   RBC 2.94 (L) 3.87 - 5.11 MIL/uL   Hemoglobin 8.4 (L) 12.0 - 15.0 g/dL   HCT 26.6 (L) 36.0 - 46.0 %   MCV 90.5 78.0 - 100.0 fL   MCH 28.6 26.0 - 34.0 pg   MCHC 31.6 30.0 - 36.0 g/dL   RDW 18.3 (H) 11.5 - 15.5 %   Platelets 276 150 - 400 K/uL   Neutrophils Relative % 78 %   Lymphocytes Relative 7 %   Monocytes Relative 5 %   Eosinophils Relative 0 %   Basophils Relative 0 %   Band Neutrophils 10 %   Neutro Abs 8.8 (H) 1.7 - 7.7 K/uL   Lymphs Abs 0.7 0.7 - 4.0 K/uL   Monocytes Absolute 0.5 0.1 - 1.0 K/uL   Eosinophils Absolute 0.0 0.0 - 0.7 K/uL   Basophils Absolute 0.0 0.0 - 0.1 K/uL   RBC Morphology TARGET CELLS     Comment: Performed at [facility], [place] [address], [place], [place] [address]  Troponin I     Status: None   Collection Time: 11/17/2017 10:30 AM  Result Value Ref Range   Troponin I <0.03 <0.03 ng/mL    Comment: Performed at [facility], [place] [address] [place], [place] [address]  Brain natriuretic peptide     Status: Abnormal   Collection Time: 12/04/2017 10:32 AM  Result Value Ref Range   B Natriuretic Peptide 505.8 (H) 0.0 - 100.0 pg/mL    Comment: Performed at [facility], [place] [address] [place], [place] [address]  Lipase, blood     Status: None   Collection Time: 11/16/2017 10:39 AM  Result Value Ref Range   Lipase 15 11 - 51 U/L    Comment: Performed at [facility], [place] [address], [place], [place] [address]   Dg Chest 2 View  Result Date: 11/17/2017 CLINICAL DATA:  Shortness of breath.  Metastatic gallbladder cancer. EXAM: CHEST - 2 VIEW COMPARISON:  11/02/2017 FINDINGS: Lateral view degraded by patient arm position and poor inspiratory effort. The frontal radiograph is also of low lung volumes. Port-A-Cath terminates at the low SVC. Right hemidiaphragm elevation. Midline trachea. Cardiomegaly accentuated by AP portable technique. No pleural effusion or pneumothorax. Interstitial prominence is favored to be due to AP portable technique and low lung volumes. Mild right infrahilar and medial lower lobe atelectasis. No free intraperitoneal air. IMPRESSION: Low lung volumes with mild right-sided volume loss and atelectasis. Electronically Signed   By: Abigail Miyamoto M.D.   On: 11/24/2017 11:10    Pending Labs Unresulted Labs (From admission, onward)   Start     Ordered   12/09/2017 1010  Urinalysis, Routine w reflex microscopic  STAT,   STAT     11/19/2017 1009      Vitals/Pain Today's Vitals   11/16/2017 1015 11/14/2017 1138 11/20/2017 1218  BP:   (!) 119/53  Pulse:   72  Resp:   (!) 27  SpO2:   100%  PainSc: 9  6      Isolation Precautions No active isolations  Medications Medications  HYDROmorphone (DILAUDID) injection 1 mg (1 mg Intravenous Given 12/03/2017 1050)  ondansetron (ZOFRAN) injection 4 mg (4 mg Intravenous Given 11/23/2017 1050)  sodium chloride 0.9 %  bolus 1,000 mL (0 mLs Intravenous Stopped 11/09/2017 1322)    Mobility walks with person assist

## 2017-11-18 NOTE — ED Notes (Signed)
Bed: WA17 Expected date:  Expected time:  Means of arrival:  Comments: 

## 2017-11-19 ENCOUNTER — Observation Stay (HOSPITAL_COMMUNITY): Payer: BLUE CROSS/BLUE SHIELD

## 2017-11-19 ENCOUNTER — Telehealth: Payer: Self-pay

## 2017-11-19 DIAGNOSIS — C23 Malignant neoplasm of gallbladder: Secondary | ICD-10-CM | POA: Diagnosis present

## 2017-11-19 DIAGNOSIS — N182 Chronic kidney disease, stage 2 (mild): Secondary | ICD-10-CM | POA: Diagnosis present

## 2017-11-19 DIAGNOSIS — E11649 Type 2 diabetes mellitus with hypoglycemia without coma: Secondary | ICD-10-CM | POA: Diagnosis not present

## 2017-11-19 DIAGNOSIS — K2210 Ulcer of esophagus without bleeding: Secondary | ICD-10-CM

## 2017-11-19 DIAGNOSIS — C799 Secondary malignant neoplasm of unspecified site: Secondary | ICD-10-CM | POA: Diagnosis not present

## 2017-11-19 DIAGNOSIS — R1011 Right upper quadrant pain: Secondary | ICD-10-CM | POA: Diagnosis present

## 2017-11-19 DIAGNOSIS — I10 Essential (primary) hypertension: Secondary | ICD-10-CM | POA: Diagnosis not present

## 2017-11-19 DIAGNOSIS — E860 Dehydration: Secondary | ICD-10-CM | POA: Diagnosis present

## 2017-11-19 DIAGNOSIS — I952 Hypotension due to drugs: Secondary | ICD-10-CM | POA: Diagnosis not present

## 2017-11-19 DIAGNOSIS — C779 Secondary and unspecified malignant neoplasm of lymph node, unspecified: Secondary | ICD-10-CM | POA: Diagnosis not present

## 2017-11-19 DIAGNOSIS — Z8509 Personal history of malignant neoplasm of other digestive organs: Secondary | ICD-10-CM | POA: Diagnosis not present

## 2017-11-19 DIAGNOSIS — D630 Anemia in neoplastic disease: Secondary | ICD-10-CM | POA: Diagnosis not present

## 2017-11-19 DIAGNOSIS — Z7189 Other specified counseling: Secondary | ICD-10-CM | POA: Diagnosis not present

## 2017-11-19 DIAGNOSIS — C772 Secondary and unspecified malignant neoplasm of intra-abdominal lymph nodes: Secondary | ICD-10-CM | POA: Diagnosis present

## 2017-11-19 DIAGNOSIS — E1122 Type 2 diabetes mellitus with diabetic chronic kidney disease: Secondary | ICD-10-CM | POA: Diagnosis present

## 2017-11-19 DIAGNOSIS — D500 Iron deficiency anemia secondary to blood loss (chronic): Secondary | ICD-10-CM | POA: Diagnosis present

## 2017-11-19 DIAGNOSIS — Z515 Encounter for palliative care: Secondary | ICD-10-CM | POA: Diagnosis present

## 2017-11-19 DIAGNOSIS — N179 Acute kidney failure, unspecified: Secondary | ICD-10-CM | POA: Diagnosis present

## 2017-11-19 DIAGNOSIS — Z9221 Personal history of antineoplastic chemotherapy: Secondary | ICD-10-CM | POA: Diagnosis not present

## 2017-11-19 DIAGNOSIS — I959 Hypotension, unspecified: Secondary | ICD-10-CM | POA: Diagnosis not present

## 2017-11-19 DIAGNOSIS — Z66 Do not resuscitate: Secondary | ICD-10-CM | POA: Diagnosis present

## 2017-11-19 DIAGNOSIS — E119 Type 2 diabetes mellitus without complications: Secondary | ICD-10-CM | POA: Diagnosis not present

## 2017-11-19 DIAGNOSIS — Z87891 Personal history of nicotine dependence: Secondary | ICD-10-CM | POA: Diagnosis not present

## 2017-11-19 DIAGNOSIS — B370 Candidal stomatitis: Secondary | ICD-10-CM | POA: Diagnosis not present

## 2017-11-19 DIAGNOSIS — R52 Pain, unspecified: Secondary | ICD-10-CM

## 2017-11-19 DIAGNOSIS — K264 Chronic or unspecified duodenal ulcer with hemorrhage: Secondary | ICD-10-CM | POA: Diagnosis present

## 2017-11-19 DIAGNOSIS — R0603 Acute respiratory distress: Secondary | ICD-10-CM | POA: Diagnosis not present

## 2017-11-19 DIAGNOSIS — E440 Moderate protein-calorie malnutrition: Secondary | ICD-10-CM

## 2017-11-19 DIAGNOSIS — R7989 Other specified abnormal findings of blood chemistry: Secondary | ICD-10-CM | POA: Diagnosis not present

## 2017-11-19 DIAGNOSIS — C229 Malignant neoplasm of liver, not specified as primary or secondary: Secondary | ICD-10-CM

## 2017-11-19 DIAGNOSIS — K269 Duodenal ulcer, unspecified as acute or chronic, without hemorrhage or perforation: Secondary | ICD-10-CM | POA: Diagnosis not present

## 2017-11-19 DIAGNOSIS — E1165 Type 2 diabetes mellitus with hyperglycemia: Secondary | ICD-10-CM

## 2017-11-19 DIAGNOSIS — D259 Leiomyoma of uterus, unspecified: Secondary | ICD-10-CM | POA: Diagnosis not present

## 2017-11-19 DIAGNOSIS — K8021 Calculus of gallbladder without cholecystitis with obstruction: Secondary | ICD-10-CM | POA: Diagnosis present

## 2017-11-19 DIAGNOSIS — G893 Neoplasm related pain (acute) (chronic): Secondary | ICD-10-CM | POA: Diagnosis present

## 2017-11-19 DIAGNOSIS — I129 Hypertensive chronic kidney disease with stage 1 through stage 4 chronic kidney disease, or unspecified chronic kidney disease: Secondary | ICD-10-CM | POA: Diagnosis present

## 2017-11-19 DIAGNOSIS — Z6827 Body mass index (BMI) 27.0-27.9, adult: Secondary | ICD-10-CM | POA: Diagnosis not present

## 2017-11-19 DIAGNOSIS — R4182 Altered mental status, unspecified: Secondary | ICD-10-CM | POA: Diagnosis not present

## 2017-11-19 DIAGNOSIS — C787 Secondary malignant neoplasm of liver and intrahepatic bile duct: Secondary | ICD-10-CM | POA: Diagnosis present

## 2017-11-19 DIAGNOSIS — D649 Anemia, unspecified: Secondary | ICD-10-CM | POA: Diagnosis not present

## 2017-11-19 LAB — URINALYSIS, ROUTINE W REFLEX MICROSCOPIC
BACTERIA UA: NONE SEEN
BILIRUBIN URINE: NEGATIVE
GLUCOSE, UA: 50 mg/dL — AB
Hgb urine dipstick: NEGATIVE
KETONES UR: NEGATIVE mg/dL
Leukocytes, UA: NEGATIVE
Nitrite: NEGATIVE
PH: 5 (ref 5.0–8.0)
PROTEIN: 30 mg/dL — AB
Specific Gravity, Urine: 1.016 (ref 1.005–1.030)

## 2017-11-19 LAB — CBC WITH DIFFERENTIAL/PLATELET
BASOS ABS: 0 10*3/uL (ref 0.0–0.1)
Basophils Relative: 0 %
EOS ABS: 0 10*3/uL (ref 0.0–0.7)
Eosinophils Relative: 0 %
HCT: 27.1 % — ABNORMAL LOW (ref 36.0–46.0)
Hemoglobin: 8.6 g/dL — ABNORMAL LOW (ref 12.0–15.0)
LYMPHS PCT: 6 %
Lymphs Abs: 0.7 10*3/uL (ref 0.7–4.0)
MCH: 28.3 pg (ref 26.0–34.0)
MCHC: 31.7 g/dL (ref 30.0–36.0)
MCV: 89.1 fL (ref 78.0–100.0)
Monocytes Absolute: 1.1 10*3/uL — ABNORMAL HIGH (ref 0.1–1.0)
Monocytes Relative: 9 %
NEUTROS PCT: 85 %
Neutro Abs: 10.3 10*3/uL — ABNORMAL HIGH (ref 1.7–7.7)
PLATELETS: 268 10*3/uL (ref 150–400)
RBC: 3.04 MIL/uL — AB (ref 3.87–5.11)
RDW: 18.2 % — ABNORMAL HIGH (ref 11.5–15.5)
WBC: 12.1 10*3/uL — AB (ref 4.0–10.5)

## 2017-11-19 LAB — GLUCOSE, CAPILLARY
GLUCOSE-CAPILLARY: 120 mg/dL — AB (ref 65–99)
Glucose-Capillary: 136 mg/dL — ABNORMAL HIGH (ref 65–99)
Glucose-Capillary: 126 mg/dL — ABNORMAL HIGH (ref 65–99)
Glucose-Capillary: 161 mg/dL — ABNORMAL HIGH (ref 65–99)
Glucose-Capillary: 201 mg/dL — ABNORMAL HIGH (ref 65–99)

## 2017-11-19 LAB — BASIC METABOLIC PANEL
ANION GAP: 12 (ref 5–15)
BUN: 56 mg/dL — ABNORMAL HIGH (ref 6–20)
CALCIUM: 9.1 mg/dL (ref 8.9–10.3)
CO2: 21 mmol/L — AB (ref 22–32)
Chloride: 108 mmol/L (ref 101–111)
Creatinine, Ser: 1.85 mg/dL — ABNORMAL HIGH (ref 0.44–1.00)
GFR calc non Af Amer: 28 mL/min — ABNORMAL LOW (ref >60)
GFR, EST AFRICAN AMERICAN: 32 mL/min — AB (ref >60)
Glucose, Bld: 128 mg/dL — ABNORMAL HIGH (ref 65–99)
Potassium: 4 mmol/L (ref 3.5–5.1)
Sodium: 141 mmol/L (ref 135–145)

## 2017-11-19 MED ORDER — FENTANYL 25 MCG/HR TD PT72
25.0000 ug | MEDICATED_PATCH | TRANSDERMAL | Status: DC
  Administered 2017-11-19 (×2): 25 ug via TRANSDERMAL
  Filled 2017-11-19 (×2): qty 1

## 2017-11-19 MED ORDER — UNJURY CHICKEN SOUP POWDER
8.0000 [oz_av] | Freq: Three times a day (TID) | ORAL | Status: DC
  Administered 2017-11-19: 8 [oz_av] via ORAL
  Filled 2017-11-19 (×8): qty 27

## 2017-11-19 MED ORDER — SODIUM CHLORIDE 0.9 % IV SOLN
INTRAVENOUS | Status: DC
  Administered 2017-11-19 – 2017-11-20 (×3): via INTRAVENOUS

## 2017-11-19 MED ORDER — MORPHINE SULFATE (PF) 2 MG/ML IV SOLN
1.0000 mg | INTRAVENOUS | Status: DC | PRN
  Administered 2017-11-19 – 2017-11-21 (×10): 2 mg via INTRAVENOUS
  Filled 2017-11-19 (×12): qty 1

## 2017-11-19 NOTE — Progress Notes (Signed)
Initial Nutrition Assessment  DOCUMENTATION CODES:   Non-severe (moderate) malnutrition in context of chronic illness  INTERVENTION:   Recommend monitor Mg/Phos/K due to refeeding risk  Continue Boost Breeze po TID, each supplement provides 250 kcal and 9 grams of protein  Unjury TID with meals, each supplement provides 100 kcal and 21 grams of protein   NUTRITION DIAGNOSIS:   Moderate Malnutrition related to cancer and cancer related treatments as evidenced by energy intake < or equal to 50% for > or equal to 1 month, percent weight loss, mild fat depletion, mild muscle depletion.   GOAL:   Patient will meet greater than or equal to 90% of their needs    MONITOR:   PO intake, Supplement acceptance, Labs, Weight trends  REASON FOR ASSESSMENT:   Malnutrition Screening Tool    ASSESSMENT:   62 yo female admitted 3/10 with severe RUQ pain, N/V in the setting of significant of metastatic gallbladder carcinoma, s/p chemo on 3/5, chronic anemia history of GI bleed. PMH duodenal/esophageal ulcers, T2DM, CAD with HTN, CKD2.   Spoke with pt's family. Pt was unable to answer questions due to sedation:   Pt has had decreased PO intake for 2-3 months d/t pain associated with eating (in RUQ r/t gall bladder)  PO intake for the past 2-3 significantly decreased from normal (25-33%); pt also limiting intake to clear liquids  Pt does not like "milky" ONS; able to tolerate clear ONS (e.g. Boost Breeze)   Pt started first round of chemo last Tuesday (3/5).   PO intake decreased further (no solid food, only drinking half clear lquids  Experiencing N/V  Constipation (family confirmed no BM since 3/8  Yesterday PO intake limited to 1/2 cup chicken broth and 1/2 cup of ice tea and water  Family reports that pt has lost a significant amount of weight in past few months. They speculate that this is due to pt poor PO intake since eating food triggers pain. Per family wt loss is 30 lbs  in past few months (UBW 200lbs) This is consistent with medical record ~11-14% lost in past 2.5 months  Patient is at risk for severe malnutrition if her current poor PO intake persists.   Wt Readings from Last 15 Encounters:  11/27/2017 178 lb 9.6 oz (81 kg)  11/13/17 182 lb 11.2 oz (82.9 kg)  11/05/17 207 lb 10.8 oz (94.2 kg)  11/01/17 184 lb 1.6 oz (83.5 kg)  10/30/17 190 lb (86.2 kg)  10/29/17 190 lb 8 oz (86.4 kg)  10/22/17 190 lb (86.2 kg)  10/18/17 201 lb (91.2 kg)  09/21/17 206 lb (93.4 kg)  05/31/15 226 lb (102.5 kg)  08/02/11 226 lb 1.9 oz (102.6 kg)    Medications:  Insulin (SS, HS, lanuts) Protonix Miralax Sen[place]ot IV NaCl @75  ml/hr  Labs:  CBG's 136, 126, 120 Na 141 WNL K 4.0 WNL Cr 1.85 H GFR 32 L    NUTRITION - FOCUSED PHYSICAL EXAM:    Most Recent Value  Orbital Region  Mild depletion  Upper Arm Region  Mild depletion  Thoracic and Lumbar Region  No depletion  Buccal Region  Mild depletion  Temple Region  Mild depletion  Clavicle Bone Region  Mild depletion  Clavicle and Acromion Bone Region  Mild depletion  Scapular Bone Region  Unable to assess  Dorsal Hand  No depletion  Patellar Region  No depletion  Anterior Thigh Region  No depletion  Posterior Calf Region  No depletion  Edema (RD Assessment)  None  Hair  Reviewed  Eyes  Reviewed  Mouth  Reviewed  Skin  Reviewed  Nails  Reviewed       Diet Order:  Diet Carb Modified Fluid consistency: Thin; Room service appropriate? Yes  EDUCATION NEEDS:   Not appropriate for education at this time  Skin:  Skin Assessment: Reviewed RN Assessment  Last BM:  3/8 per family and MD record  Height:   Ht Readings from Last 1 Encounters:  12/07/2017 5\' 8"  (1.727 m)    Weight:   Wt Readings from Last 1 Encounters:  11/25/2017 178 lb 9.6 oz (81 kg)    Ideal Body Weight:  63.7 kg  BMI:  Body mass index is 27.16 kg/m.  Estimated Nutritional Needs:   Kcal:  2100-2300 kcal (25-28  kcal/kg)  Protein:  105-115 g (20% kcal; 1.3-1.4 g/kg)  Fluid:  >=2.1 L    Edmonia Lynch Dietetic Intern Pager: [phone]

## 2017-11-19 NOTE — Progress Notes (Addendum)
PROGRESS NOTE    Julie Barker  GNF:[id number] DOB: [birth year] DOA: 12/04/2017 PCP: Wenda Low, MD    Brief Narrative:  Julie Barker is a 62 y.o. female with medical history significant of metastatic gallbladder carcinoma, status post chemo on 5 March by Dr. Benay Spice, chronic anemia history of GI bleed, comes in for persistent right quadrant abdominal pain associated with nausea and vomiting since 3-4 days.  She was started on hydrocodone without much relief.  she was referred to [facility] for admission for pain control. She was started on dilaudid , but she is pretty much sedated from it and not eating or drinking.  It was changed to morphine and fentanyl with hydrocodone.     Assessment & Plan:   Active Problems:   HTN (hypertension)   Liver cancer (HCC)   Anemia   Symptomatic anemia   Duodenal ulcer: Per EGD 10/18/2017   Esophageal ulceration: EGD 10/18/2017   Type 2 diabetes mellitus without complication (HCC)   Gallbladder cancer (H[facility])   Inadequate pain control   Malnutrition of moderate degree   Uncontrolled right upper quadrant pain probably from the metastatic gallbladder carcinoma  Dilaudid was sedating her, will change to morphine and fentanyl.  Hydrate and stool softeners and laxatives.  US of the RUQ shows slight increase in the gb tumour size to 10 cm.  Requested oncology input.   Metastatic gallbladder adenocarcinoma Last chemo on March 5th.  Further management as per oncology. Patient's bilirubin stable around 2.6.  Acute on stage II CKD creatinine slightly worse than baseline currently creatinine is 1.8.  Probably from decreased p.o. intake from abdominal pain. Gently hydrate and repeat renal parameters in the morning.   Anemia from duodenal ulcer per EGD on 10/18/2017 Status post 2 units of PRBC transfusion recently. Hemoglobin stable around 8.4.  Transfuse to keep hemoglobin greater than 7.  Monitor counts serially. Added PPI.    Type 2  diabetes mellitus Resume home dose insulin and SSI. CBG (last 3)  Recent Labs    11/19/17 0750 11/19/17 1216 11/19/17 1710  GLUCAP 126* 120* 161*      Hypertension Suboptimal possibly from right upper quadrant pain.  Moderate Malnutrition:  Dietary consulted for recommendations.      DVT prophylaxis: scd's Code Status: full code.  Family Communication: discussed with daughter at bedside and over the phone.  Disposition Plan: pending resolution of the abd pain.    Consultants:  Oncology Palliative care for pain management.   Procedures:  None.    Antimicrobials: none.    Subjective: Sedated from dilaudid.   Objective: Vitals:   12/02/2017 2039 11/19/17 0732 11/19/17 1021 11/19/17 1650  BP: 136/67 119/68  (!) 153/71  Pulse: 83 88  90  Resp: 20 19 (!) 40 20  Temp: 99.2 F (37.3 C) 98.7 F (37.1 C)  98.3 F (36.8 C)  TempSrc: Oral Oral  Oral  SpO2: 98% 98%  91%  Weight:      Height:        Intake/Output Summary (Last 24 hours) at 11/19/2017 1802 Last data filed at 11/19/2017 0300 Gross per 24 hour  Intake -  Output 320 ml  Net -320 ml   Filed Weights   11/20/2017 1350  Weight: 81 kg (178 lb 9.6 oz)    Examination:  General exam: Appears comfortable but sleepy,  Respiratory system: Clear to auscultation. Respiratory effort normal. Cardiovascular system: S1 & S2 heard, RRR. No JVD, murmurs, rubs, gallops or clicks. No pedal edema.  Gastrointestinal system: Abdomen is soft slightly distended, tender in the RUQ, bowel sounds heard.  Central nervous system: sedated from the pain meds.  Extremities: Symmetric 5 x 5 power. Skin: No rashes, lesions or ulcers Psychiatry: sleepy , mood normal.     Data Reviewed: I have personally reviewed following labs and imaging studies  CBC: Recent Labs  Lab 11/13/17 0901 11/16/2017 1030 11/19/17 1000  WBC 14.8* 10.0 12.1*  NEUTROABS 12.4* 8.8* 10.3*  HGB  --  8.4* 8.6*  HCT 22.2* 26.6* 27.1*  MCV 90.4  90.5 89.1  PLT 318 276 833   Basic Metabolic Panel: Recent Labs  Lab 11/13/17 0901 12/09/2017 1030 11/19/17 1000  NA 133* 135 141  K 3.6 3.9 4.0  CL 100 101 108  CO2 23 19* 21*  GLUCOSE 293* 438* 128*  BUN 15 36* 56*  CREATININE 1.31* 1.50* 1.85*  CALCIUM 9.1 8.8* 9.1   GFR: Estimated Creatinine Clearance: 34.7 mL/min (A) (by C-G formula based on SCr of 1.85 mg/dL (H)). Liver Function Tests: Recent Labs  Lab 11/13/17 0901 11/23/2017 1030  AST 27 93*  ALT 20 53  ALKPHOS 287* 205*  BILITOT 2.9* 2.6*  PROT 6.8 6.8  ALBUMIN 1.6* 1.9*   Recent Labs  Lab 11/13/2017 1039  LIPASE 15   No results for input(s): AMMONIA in the last 168 hours. Coagulation Profile: No results for input(s): INR, PROTIME in the last 168 hours. Cardiac Enzymes: Recent Labs  Lab 11/20/2017 1030  TROPONINI <0.03   BNP (last 3 results) No results for input(s): PROBNP in the last 8760 hours. HbA1C: No results for input(s): HGBA1C in the last 72 hours. CBG: Recent Labs  Lab 11/29/2017 2037 11/19/17 0311 11/19/17 0750 11/19/17 1216 11/19/17 1710  GLUCAP 239* 136* 126* 120* 161*   Lipid Profile: No results for input(s): CHOL, HDL, LDLCALC, TRIG, CHOLHDL, LDLDIRECT in the last 72 hours. Thyroid Function Tests: No results for input(s): TSH, T4TOTAL, FREET4, T3FREE, THYROIDAB in the last 72 hours. Anemia Panel: No results for input(s): VITAMINB12, FOLATE, FERRITIN, TIBC, IRON, RETICCTPCT in the last 72 hours. Sepsis Labs: No results for input(s): PROCALCITON, LATICACIDVEN in the last 168 hours.  No results found for this or any previous visit (from the past 240 hour(s)).       Radiology Studies: Dg Chest 2 View  Result Date: 11/11/2017 CLINICAL DATA:  Shortness of breath.  Metastatic gallbladder cancer. EXAM: CHEST - 2 VIEW COMPARISON:  11/02/2017 FINDINGS: Lateral view degraded by patient arm position and poor inspiratory effort. The frontal radiograph is also of low lung volumes.  Port-A-Cath terminates at the low SVC. Right hemidiaphragm elevation. Midline trachea. Cardiomegaly accentuated by AP portable technique. No pleural effusion or pneumothorax. Interstitial prominence is favored to be due to AP portable technique and low lung volumes. Mild right infrahilar and medial lower lobe atelectasis. No free intraperitoneal air. IMPRESSION: Low lung volumes with mild right-sided volume loss and atelectasis. Electronically Signed   By: Abigail Miyamoto M.D.   On: 11/09/2017 11:10   US Abdomen Limited Ruq  Result Date: 11/19/2017 CLINICAL DATA:  Right upper quadrant abdominal pain. EXAM: ULTRASOUND ABDOMEN LIMITED RIGHT UPPER QUADRANT COMPARISON:  Ultrasound of November 02, 2017. CT scan of October 26, 2017. FINDINGS: Gallbladder: 10.2 x 10.2 x 8.5 cm predominantly solid abnormality is seen in gallbladder fossa consistent with history of gallbladder carcinoma. Positive sonographic Murphy's sign is noted. Common bile duct: Diameter: 6.2 mm which is within normal limits. Stent is noted within the common  bile duct. Liver: Multiple masses are noted throughout hepatic parenchyma consistent with metastatic disease, the largest measuring 4.0 x 3.9 x 3.6 cm. Mild intrahepatic biliary dilatation is noted. Doppler demonstrates no flow in portal vein concerning for thrombosis or occlusion. IMPRESSION: 10.2 cm predominantly solid mass seen in gallbladder fossa consistent with history of gallbladder carcinoma. Mild intrahepatic biliary dilatation is noted. Multiple hepatic masses are noted consistent with metastatic disease. No flow is noted in portal vein on Doppler, concerning for portal vein thrombosis or occlusion. These results will be called to the ordering clinician or representative by the Radiologist Assistant, and communication documented in the PACS or zVision Dashboard. Electronically Signed   By: Marijo Conception, M.D.   On: 11/19/2017 12:38        Scheduled Meds: . carvedilol  25 mg Oral  BID WC  . fentaNYL  25 mcg Transdermal Q72H  . insulin aspart  0-5 Units Subcutaneous QHS  . insulin aspart  0-9 Units Subcutaneous TID WC  . insulin glargine  25 Units Subcutaneous BID  . isosorbide mononitrate  60 mg Oral BH-q7a  . latanoprost  1 drop Both Eyes QHS  . pantoprazole  40 mg Oral Q0600  . polyethylene glycol  17 g Oral Daily  . protein supplement  8 oz Oral TID WC  . senna-docusate  1 tablet Oral BID  . ursodiol  300 mg Oral TID   Continuous Infusions: . sodium chloride 75 mL/hr at 11/19/17 1020     LOS: 0 days    Time spent: 35 minutes.     Hosie Poisson, MD [facility] Pager [phone]  If 7PM-7AM, please contact night-coverage [email or web address] Password [facility] 11/19/2017, 6:02 PM

## 2017-11-19 NOTE — Telephone Encounter (Signed)
Pt daughter called to inform that pt is hospitalized "due to uncontrolled pain and labored breathing". Notified daughter that Dr. Benay Spice is out of the office until Wednesday. Lattie Haw, NP and Dr. Benay Spice notified that pt is admitted. Pt daughter voiced understanding.

## 2017-11-19 NOTE — Progress Notes (Signed)
HEMATOLOGY-ONCOLOGY PROGRESS NOTE  SUBJECTIVE: Severe and intractable abdominal pain from metastatic gallbladder carcinoma Status post first cycle of chemotherapy with FOLFOX on 11/13/2017 Family very concerned about her pain regimen.  They worried that the pain medicine is only making her drowsy and not comfortable.  OBJECTIVE: REVIEW OF SYSTEMS:   Patient is drowsy but able to open her eyes and talk to me.  She was able to answer my questions.  She does me that the pain is 9 out of 10.  She is complaining of pain that radiates into her back.  PHYSICAL EXAMINATION: ECOG PERFORMANCE STATUS: 1 - Symptomatic but completely ambulatory  Vitals:   11/19/17 0732 11/19/17 1021  BP: 119/68   Pulse: 88   Resp: 19 (!) 40  Temp: 98.7 F (37.1 C)   SpO2: 98%    Filed Weights   12/02/2017 1350  Weight: 178 lb 9.6 oz (81 kg)    GENERAL: Appears very uncomfortable   SKIN: skin color, texture, turgor are normal, no rashes or significant lesions EYES: normal, Conjunctiva are pink and non-injected, sclera clear OROPHARYNX:no exudate, no erythema and lips, buccal mucosa, and tongue normal  NECK: supple, thyroid normal size, non-tender, without nodularity LYMPH:  no palpable lymphadenopathy in the cervical, axillary or inguinal LUNGS: clear to auscultation and percussion with normal breathing effort HEART: regular rate & rhythm and no murmurs and no lower extremity edema ABDOMEN:abdomen soft, non-tender and normal bowel sounds Musculoskeletal:no cyanosis of digits and no clubbing  NEURO: alert & oriented x 3 with fluent speech, no focal motor/sensory deficits  LABORATORY DATA:  I have reviewed the data as listed CMP Latest Ref Rng & Units 11/19/2017 12/04/2017 11/13/2017  Glucose 65 - 99 mg/dL 128(H) 438(H) 293(H)  BUN 6 - 20 mg/dL 56(H) 36(H) 15  Creatinine 0.44 - 1.00 mg/dL 1.85(H) 1.50(H) 1.31(H)  Sodium 135 - 145 mmol/L 141 135 133(L)  Potassium 3.5 - 5.1 mmol/L 4.0 3.9 3.6  Chloride 101 - 111  mmol/L 108 101 100  CO2 22 - 32 mmol/L 21(L) 19(L) 23  Calcium 8.9 - 10.3 mg/dL 9.1 8.8(L) 9.1  Total Protein 6.5 - 8.1 g/dL - 6.8 6.8  Total Bilirubin 0.3 - 1.2 mg/dL - 2.6(H) 2.9(H)  Alkaline Phos 38 - 126 U/L - 205(H) 287(H)  AST 15 - 41 U/L - 93(H) 27  ALT 14 - 54 U/L - 53 20    Lab Results  Component Value Date   WBC 12.1 (H) 11/19/2017   HGB 8.6 (L) 11/19/2017   HCT 27.1 (L) 11/19/2017   MCV 89.1 11/19/2017   PLT 268 11/19/2017   NEUTROABS 10.3 (H) 11/19/2017    ASSESSMENT AND PLAN: 1.  Intractable abdominal pain: Patient is currently receiving 25 mcg of fentanyl patch along with Norco 5/325. I evaluated the ultrasound report and it suggests that the tumor is quite large at 10.2 cm.   There were multiple liver masses the largest measuring 4 cm.  Abdominal pain: Metastatic gallbladder cancer: Consult with palliative care. 2. patient received first dose of FOLFOX on 11/13/2017 . 3.  Anemia hemoglobin 8.6.  Patient is also likely to have a decline in the neutrophil count. 4.  Acute renal failure. 5.  Poor nutrition: I discussed the family that if she does not start to pick up on her nutrition then she may need a tube feeds

## 2017-11-20 DIAGNOSIS — C799 Secondary malignant neoplasm of unspecified site: Secondary | ICD-10-CM

## 2017-11-20 DIAGNOSIS — R52 Pain, unspecified: Secondary | ICD-10-CM

## 2017-11-20 DIAGNOSIS — Z7189 Other specified counseling: Secondary | ICD-10-CM

## 2017-11-20 DIAGNOSIS — B370 Candidal stomatitis: Secondary | ICD-10-CM

## 2017-11-20 DIAGNOSIS — Z515 Encounter for palliative care: Secondary | ICD-10-CM

## 2017-11-20 LAB — CBC
HCT: 23.9 % — ABNORMAL LOW (ref 36.0–46.0)
HEMOGLOBIN: 7.6 g/dL — AB (ref 12.0–15.0)
MCH: 28.4 pg (ref 26.0–34.0)
MCHC: 31.8 g/dL (ref 30.0–36.0)
MCV: 89.2 fL (ref 78.0–100.0)
Platelets: 199 10*3/uL (ref 150–400)
RBC: 2.68 MIL/uL — AB (ref 3.87–5.11)
RDW: 18.5 % — ABNORMAL HIGH (ref 11.5–15.5)
WBC: 12.5 10*3/uL — ABNORMAL HIGH (ref 4.0–10.5)

## 2017-11-20 LAB — HEPATIC FUNCTION PANEL
ALT: 79 U/L — AB (ref 14–54)
AST: 178 U/L — ABNORMAL HIGH (ref 15–41)
Albumin: 1.5 g/dL — ABNORMAL LOW (ref 3.5–5.0)
Alkaline Phosphatase: 176 U/L — ABNORMAL HIGH (ref 38–126)
BILIRUBIN INDIRECT: 1.1 mg/dL — AB (ref 0.3–0.9)
Bilirubin, Direct: 1 mg/dL — ABNORMAL HIGH (ref 0.1–0.5)
Total Bilirubin: 2.1 mg/dL — ABNORMAL HIGH (ref 0.3–1.2)
Total Protein: 5.9 g/dL — ABNORMAL LOW (ref 6.5–8.1)

## 2017-11-20 LAB — GLUCOSE, CAPILLARY
GLUCOSE-CAPILLARY: 159 mg/dL — AB (ref 65–99)
GLUCOSE-CAPILLARY: 169 mg/dL — AB (ref 65–99)
GLUCOSE-CAPILLARY: 188 mg/dL — AB (ref 65–99)
Glucose-Capillary: 178 mg/dL — ABNORMAL HIGH (ref 65–99)

## 2017-11-20 LAB — BASIC METABOLIC PANEL
Anion gap: 10 (ref 5–15)
BUN: 65 mg/dL — AB (ref 6–20)
CHLORIDE: 112 mmol/L — AB (ref 101–111)
CO2: 23 mmol/L (ref 22–32)
Calcium: 9.1 mg/dL (ref 8.9–10.3)
Creatinine, Ser: 1.67 mg/dL — ABNORMAL HIGH (ref 0.44–1.00)
GFR calc Af Amer: 37 mL/min — ABNORMAL LOW (ref >60)
GFR calc non Af Amer: 32 mL/min — ABNORMAL LOW (ref >60)
Glucose, Bld: 179 mg/dL — ABNORMAL HIGH (ref 65–99)
POTASSIUM: 3.6 mmol/L (ref 3.5–5.1)
SODIUM: 145 mmol/L (ref 135–145)

## 2017-11-20 LAB — AMMONIA: Ammonia: 72 umol/L — ABNORMAL HIGH (ref 9–35)

## 2017-11-20 LAB — TSH: TSH: 0.487 u[IU]/mL (ref 0.350–4.500)

## 2017-11-20 LAB — T4, FREE: FREE T4: 0.86 ng/dL (ref 0.61–1.12)

## 2017-11-20 LAB — MAGNESIUM: MAGNESIUM: 1.5 mg/dL — AB (ref 1.7–2.4)

## 2017-11-20 MED ORDER — FLUCONAZOLE 100MG IVPB
100.0000 mg | INTRAVENOUS | Status: DC
  Administered 2017-11-20 – 2017-11-21 (×2): 100 mg via INTRAVENOUS
  Filled 2017-11-20 (×2): qty 50

## 2017-11-20 MED ORDER — POLYETHYLENE GLYCOL 3350 17 G PO PACK: 17.0000 g | PACK | Freq: Two times a day (BID) | ORAL | Status: DC

## 2017-11-20 MED ORDER — OLANZAPINE 5 MG PO TBDP
5.0000 mg | ORAL_TABLET | Freq: Every day | ORAL | Status: DC
  Filled 2017-11-20: qty 1

## 2017-11-20 MED ORDER — LIP MEDEX EX OINT
TOPICAL_OINTMENT | CUTANEOUS | Status: AC
  Administered 2017-11-20: 13:00:00
  Filled 2017-11-20: qty 7

## 2017-11-20 MED ORDER — NYSTATIN 100000 UNIT/ML MT SUSP
5.0000 mL | Freq: Four times a day (QID) | OROMUCOSAL | Status: DC
  Administered 2017-11-20 – 2017-11-21 (×3): 500000 [IU] via ORAL
  Filled 2017-11-20 (×3): qty 5

## 2017-11-20 MED ORDER — DOCUSATE SODIUM 100 MG PO CAPS
100.0000 mg | ORAL_CAPSULE | Freq: Two times a day (BID) | ORAL | Status: DC
  Administered 2017-11-20: 100 mg via ORAL
  Filled 2017-11-20: qty 1

## 2017-11-20 MED ORDER — POTASSIUM CHLORIDE IN NACL 20-0.9 MEQ/L-% IV SOLN
INTRAVENOUS | Status: DC
  Administered 2017-11-20: 13:00:00 via INTRAVENOUS
  Filled 2017-11-20 (×2): qty 1000

## 2017-11-20 MED ORDER — SODIUM CHLORIDE 0.9% FLUSH: 10.0000 mL | Freq: Two times a day (BID) | INTRAVENOUS | Status: DC

## 2017-11-20 MED ORDER — BOOST / RESOURCE BREEZE PO LIQD CUSTOM: 1.0000 | Freq: Three times a day (TID) | ORAL | Status: DC

## 2017-11-20 MED ORDER — SODIUM CHLORIDE 0.9% FLUSH: 10.0000 mL | INTRAVENOUS | Status: DC | PRN

## 2017-11-20 MED ORDER — BISACODYL 10 MG RE SUPP
10.0000 mg | Freq: Once | RECTAL | Status: AC
  Administered 2017-11-20: 10 mg via RECTAL
  Filled 2017-11-20: qty 1

## 2017-11-20 NOTE — Consult Note (Addendum)
Consultation Note Date: 11/20/2017   Patient Name: Julie Barker  DOB: 1955-11-28  MRN: [id number]  Age / Sex: 62 y.o., female  PCP: Wenda Low, MD Referring Physician: Reyne Dumas, MD  Reason for Consultation: Establishing goals of care, Non pain symptom management and Pain control  HPI/Patient Profile: 62 y.o. female  with past medical history of metastatic gallbladder cancer, hypertension, hyperlipidemia, type 2 diabetes mellitus, CAD, and esophageal/duodenal ulcers s/p EGD admitted on 12/04/2017 with right upper quadrant pain associated with nausea and vomiting for 3-4 days. Recent diagnosis of metastatic gallbladder adenocarcinoma followed by Dr. Benay Spice. First chemotherapy was on 11/13/17. Recent hospitalizations in February for anemia/duodenal and esophageal ulcerations and sepsis/cholangitis. This admission, [place] of RUQ shows slight increase in gb tumour size to 10cm. Patient was started on Fentanyl 65mg patch along with Vicodin 2tabs q6h prn and Morphine 1-'2mg'$  IV q2h prn. Palliative medicine consultation for pain management.   Clinical Assessment and Goals of Care: I have reviewed medical records, discussed with care team, and met with patient, husband (Julie Barker, daughter (Julie Barker, and patient's brother at bedside.   Patient sleeping during my visit with no s/s of discomfort.   I introduced Palliative Medicine as specialized medical care for people living with serious illness. It focuses on providing relief from the symptoms and stress of a serious illness. The goal is to improve quality of life for both the patient and the family.  CBunnie Dominotells me cancer diagnosis is new within the past few months. Followed by Dr. SBenay Spiceand received first round of chemo on March 5th. The plan was for chemotherapy every 2 weeks. Julie Barker also recalls events since cancer diagnosis including two hospitalizations.    Discussed hospital diagnoses and interventions.   We discussed her mother's pain. Outpatient, patient was initially prescribed tramadol which only helped for a few days. Prior to hospitalization, patient was taking Vicodin but remained with poor pain control. Julie Barker's describes her mother's pain as constant throughout the entire abdomen. Her pain has impacted her functionally.   Julie Barker believes the dilaudid made her mother too sleepy and confused. This has since been discontinued. Original fentanyl patch (initiated on 3/11 afternoon) accidentally fell off in bed sheets. The patch was replaced last night. CBunnie Dominobelieves her mother's pain has been controlled in the last 12 hours with patch and prn morphine. We discussed continuing current regimen and with palliative re-evaluation in AM. Explained that we could increase the fentanyl patch if she seems to require frequent morphine doses.   Julie Barker also speaks of very poor nutritional status. She has seen dietician outpatient and inpatient and tries to encourage her mother to eat high protein snacks. We discussed adding low dose medication for appetite stimulation. Husband and daughter agreeable.   CBunnie Dominois also concerned about her thrush. Discussed that this could be impacting her nutritional status. Patient will be receiving IV fluconazole and nystatin QID.  The last concern that the family has is in regards to support at home. We discussed possible PT evaluation to determine home  health needs when pain is better controlled. Family agreeable.   Questions and concerns were addressed. Reassured family of continued support from palliative during hospitalization. They are appreciative.     SUMMARY OF RECOMMENDATIONS    FULL code/FULL scope  Symptom management--see below. Will continue current pain medication regimen and re-evaluate tomorrow. Adjustments will be made as needed.   Followed by Dr. Benay Spice with oncology.  Code Status/Advance Care  Planning:  Full code  Symptom Management:   Continue Fentanyl 20mg TD q72 hours  Morphine 1-'2mg'$  IV q2h prn severe pain  Vicodin 2tabs q6h prn moderate pain   Nystatin QID thrush  Zypreza '5mg'$  PO HS for appetite  Palliative Prophylaxis:   Aspiration, Delirium Protocol, Frequent Pain Assessment, Oral Care and Turn Reposition  Additional Recommendations (Limitations, Scope, Preferences):  Full Scope Treatment  Psycho-social/Spiritual:   Desire for further Chaplaincy support: yes  Additional Recommendations: Caregiving  Support/Resources  Prognosis:   Unable to determine  Discharge Planning: To Be Determined      Primary Diagnoses: Present on Admission: . Inadequate pain control . Symptomatic anemia . [place] . HTN (hypertension) . [place]bl[place] ([facility] . Esophageal ulceration: EGD 10/18/2017 . Duodenal ulcer: Per EGD 10/18/2017 . Anemia   I have reviewed the medical record, interviewed the patient and family, and examined the patient. The following aspects are pertinent.  Past Medical History:  Diagnosis Date  . Coronary artery disease    has diffuse distal vessel disease. Managed medically  . Diabetes mellitus   . Duodenal ulcer: Per EGD 10/18/2017 10/18/2017  . Esophageal ulceration: EGD 10/18/2017 10/18/2017  . Fibroid tumor   . Hyperlipidemia   . Hypertension   . Liver cancer ([facility]   . Primary cancer of gallbladder with metastasis to other site ([facility]   . Type 2 diabetes mellitus without complication ([place] 26/10/9526  Social History   Socioeconomic History  . Marital status: Married    Spouse name: None  . Number of children: None  . Years of education: None  . Highest education level: None  Social Needs  . Financial resource strain: None  . Food insecurity - worry: None  . Food insecurity - inability: None  . Transportation needs - medical: None  . Transportation needs - non-medical: None  Occupational History  . None  Tobacco Use    . Smoking status: Former Smoker    Last attempt to quit: 09/11/1998    Years since quitting: 19.2  . Smokeless tobacco: Never Used  Substance and Sexual Activity  . Alcohol use: No  . Drug use: No  . Sexual activity: None  Other Topics Concern  . None  Social History Narrative  . None   Family History  Problem Relation Age of Onset  . Aneurysm Mother   . Heart attack Father   . Stroke Father   . Hypertension Sister   . Diabetes Sister   . Rheumatic fever Sister   . Hypertension Brother   . Hypertension Brother   . Hypertension Brother   . Hypertension Sister   . Diabetes Sister    Scheduled Meds: . carvedilol  25 mg Oral BID WC  . docusate sodium  100 mg Oral BID  . feeding supplement  1 Container Oral TID BM  . fentaNYL  25 mcg Transdermal Q72H  . insulin aspart  0-5 Units Subcutaneous QHS  . insulin aspart  0-9 Units Subcutaneous TID WC  . insulin glargine  25 Units Subcutaneous BID  . isosorbide  mononitrate  60 mg Oral BH-q7a  . latanoprost  1 drop Both Eyes QHS  . nystatin  5 mL Oral QID  . OLANZapine zydis  5 mg Oral QHS  . pantoprazole  40 mg Oral Q0600  . polyethylene glycol  17 g Oral BID  . protein supplement  8 oz Oral TID WC  . senna-docusate  1 tablet Oral BID  . ursodiol  300 mg Oral TID   Continuous Infusions: . sodium chloride 75 mL/hr at 11/20/17 0808  . 0.9 % NaCl with KCl 20 mEq / L 75 mL/hr at 11/20/17 1300  . fluconazole (DIFLUCAN) IV Stopped (11/20/17 1244)   PRN Meds:.acetaminophen **OR** acetaminophen, HYDROcodone-acetaminophen, morphine injection, ondansetron **OR** ondansetron (ZOFRAN) IV Medications Prior to Admission:  Prior to Admission medications   Medication Sig Start Date End Date Taking? Authorizing Provider  carvedilol (COREG) 25 MG tablet TAKE ONE TABLET BY MOUTH TWICE DAILY WITH MEALS Patient taking differently: TAKE 25 MG BY MOUTH TWICE DAILY WITH MEALS 05/23/12  Yes Burtis Junes, NP  HYDROcodone-acetaminophen  (NORCO/VICODIN) 5-325 MG tablet Take 0.5-1 tablets by mouth every 6 (six) hours as needed for severe pain. 11/15/17  Yes Ladell Pier, MD  insulin aspart (NOVOLOG FLEXPEN) 100 UNIT/ML FlexPen Inject 2-10 Units into the skin 2 (two) times daily. Per sliding scale   Yes [provider]  insulin glargine (LANTUS) 100 UNIT/ML injection Inject 25 Units into the skin 2 (two) times daily.    Yes [provider]  isosorbide mononitrate (IMDUR) 60 MG 24 hr tablet Take 60 mg by mouth every morning.    Yes [provider]  latanoprost (XALATAN) 0.005 % ophthalmic solution Place 1 drop into both eyes at bedtime.    Yes [provider]  lidocaine-prilocaine (EMLA) cream Apply 1hr prior to port use. Do not rub in. Cover with plastic wrap. 11/13/17  Yes Ladell Pier, MD  ondansetron (ZOFRAN) 8 MG tablet Take 1 tablet (8 mg total) by mouth every 8 (eight) hours as needed for nausea or vomiting. 11/01/17  Yes Ladell Pier, MD  ondansetron (ZOFRAN-ODT) 4 MG disintegrating tablet Take 4 mg by mouth every 6 (six) hours as needed for nausea or vomiting. 10/16/17  Yes [provider]  pantoprazole (PROTONIX) 40 MG tablet Take 1 tablet (40 mg total) by mouth 2 (two) times daily before a meal. 10/20/17  Yes Eugenie Filler, MD  polyethylene glycol (MIRALAX / GLYCOLAX) packet Take 17 g by mouth daily. 10/21/17  Yes Eugenie Filler, MD  traMADol (ULTRAM) 50 MG tablet Take 1 tablet (50 mg total) by mouth every 12 (twelve) hours as needed for moderate pain or severe pain. 11/01/17  Yes Ladell Pier, MD  ursodiol (ACTIGALL) 300 MG capsule Take 1 capsule (300 mg total) by mouth 3 (three) times daily. 10/20/17  Yes Eugenie Filler, MD  colchicine 0.6 MG tablet Take 0.6 mg by mouth 2 (two) times daily as needed. At the start of gout symptoms    [provider]  prochlorperazine (COMPAZINE) 10 MG tablet Take 1 tablet (10 mg total) by mouth every 6 (six) hours as needed  for nausea or vomiting. 10/25/17   Ladell Pier, MD   No Known Allergies Review of Systems  Unable to perform ROS: Other  Sleeping during visit.   Physical Exam  Constitutional: She is sleeping.  Pulmonary/Chest: No accessory muscle usage. No tachypnea. No respiratory distress.  Nursing note and vitals reviewed.   Vital  Signs: BP 125/63 (BP Location: Right Arm)   Pulse 77   Temp 97.8 F (36.6 C) (Oral)   Resp 20   Ht '5\' 8"'$  (1.727 m)   Wt 81 kg (178 lb 9.6 oz)   SpO2 95%   BMI 27.16 kg/m  Pain Assessment: 0-10   Pain Score: Asleep   SpO2: SpO2: 95 % O2 Device:SpO2: 95 % O2 Flow Rate: .   IO: Intake/output summary:   Intake/Output Summary (Last 24 hours) at 11/20/2017 1512 Last data filed at 11/20/2017 0542 Gross per 24 hour  Intake 1452.5 ml  Output 125 ml  Net 1327.5 ml    LBM: Last BM Date: 11/17/17 Baseline Weight: Weight: 81 kg (178 lb 9.6 oz) Most recent weight: Weight: 81 kg (178 lb 9.6 oz)     Palliative Assessment/Data: PPS 50%   Flowsheet Rows     Most Recent Value  Intake Tab  Referral Department  Hospitalist  Unit at Time of Referral  Oncology Unit  Palliative Care Primary Diagnosis  Cancer  Palliative Care Type  New Palliative care  Reason for referral  Pain  Date first seen by Palliative Care  11/20/17  Clinical Assessment  Palliative Performance Scale Score  50%  Psychosocial & Spiritual Assessment  Palliative Care Outcomes  Patient/Family meeting held?  Yes  Who was at the meeting?  husband, daughter, brother  Palliative Care Outcomes  Improved pain interventions, Improved non-pain symptom therapy, Clarified goals of care      Time In/Out: 0910-1010, 1450-1500 Time Total: 52mn Greater than 50%  of this time was spent counseling and coordinating care related to the above assessment and plan.  Signed by:  MIhor Dow FNP-C Palliative Medicine Team  Phone: [phone]: [phone]  Please contact Palliative Medicine  Team phone at [phone] questions and concerns.  For individual provider: See AShea Evans

## 2017-11-20 NOTE — Progress Notes (Signed)
Nutrition Note  RD consulted for nutritional assessment.  Initial assessment completed 3/11, supplements have been ordered. RD will continue to monitor PO intakes and supplement acceptance.   Clayton Bibles, MS, RD, [facility] Dietitian Pager: [phone] After Hours Pager: [phone]

## 2017-11-20 NOTE — Progress Notes (Addendum)
PROGRESS NOTE    Julie Barker  EPP:[id number] DOB: April 04, 1956 DOA: 12/04/2017 PCP: Wenda Low, MD    Brief Narrative:  Julie Barker is a 62 y.o. female with medical history significant of metastatic gallbladder carcinoma, status post chemo on 5 March by Dr. Benay Spice, chronic anemia history of GI bleed, comes in for persistent right quadrant abdominal pain associated with nausea and vomiting since 3-4 days.  She was started on hydrocodone without much relief.  she was referred to [facility] for admission for pain control. She was started on dilaudid , but she is pretty much sedated from it and not eating or drinking. Oncology has seen her and provided recommendations. Found to be in acute kidney injury because of dehydration which is improving. Palliative care has been consulted for pain management      Assessment & Plan:   Active Problems:   HTN (hypertension)   Liver cancer (HCC)   Anemia   Symptomatic anemia   Duodenal ulcer: Per EGD 10/18/2017   Esophageal ulceration: EGD 10/18/2017   Type 2 diabetes mellitus without complication (HCC)   Gallbladder cancer (HCC)   Inadequate pain control   Malnutrition of moderate degree   Severe and intractable abdominal pain from metastatic gallbladder carcinoma Palliative care consult for pain management Started on fentanyl patch, Vicodin 2 tablets every 6 hours as needed, morphine 2 mg IV every 2 when necessary Continue IV fluids, aggressive constipation regimen US of the RUQ shows slight increase in the gb tumour size to 10 cm.  Will check ammonia level given somnolence  Metastatic gallbladder adenocarcinoma patient received first dose of FOLFOX on 11/13/2017  Further management as per oncology. According to the family Dr. Benay Spice is out of town and will be back tomorrow to make   further decisions Follow LFTs closely  Acute on stage II CKD creatinine slightly worse than baseline currently creatinine is 1.8.. Prerenal due to poor  oral intake. Improving with IV fluids   Anemia from duodenal ulcer per EGD on 10/18/2017 Status post 2 units of PRBC transfusion recently. Hemoglobin stable around 8.4.  Transfuse to keep hemoglobin greater than 7.  Monitor counts serially. Continue PPI   Type 2 diabetes mellitus Resume home dose insulin and SSI. CBG (last 3)  Recent Labs    11/19/17 1710 11/19/17 2134 11/20/17 0758  GLUCAP 161* 201* 159*  Continue Lantus and SSI    Patient has been started on nystatin Also started patient on IV fluconazole, will treat for reflux 3-5 days  Hypertension Controlled, continue Coreg, Imdur,  Moderate Malnutrition:  Dietary consulted for recommendations.      DVT prophylaxis: scd's Code Status: full code.  Family Communication: discussed with daughter at bedside and over the phone.  Disposition Plan: pending resolution of the abd pain. Pending improvement in oral intake   Consultants:  Oncology Palliative care for pain management.   Procedures:  None.    Antimicrobials: none.    Subjective: More awake and interactive today according to the daughter who is by the bedside Patient also has thrush and has had difficulty swallowing, she also complains of constipation for 3 days  Objective: Vitals:   11/19/17 1021 11/19/17 1650 11/19/17 2138 11/20/17 0541  BP:  (!) 153/71 139/70 (!) 102/44  Pulse:  90 87 83  Resp: (!) 40 20 20 20   Temp:  98.3 F (36.8 C) 98.1 F (36.7 C) 98.9 F (37.2 C)  TempSrc:  Oral Oral Oral  SpO2:  91% 97% 96%  Weight:      Height:        Intake/Output Summary (Last 24 hours) at 11/20/2017 0937 Last data filed at 11/20/2017 0542 Gross per 24 hour  Intake 1452.5 ml  Output 125 ml  Net 1327.5 ml   Filed Weights   11/28/2017 1350  Weight: 81 kg (178 lb 9.6 oz)    Examination:  General exam: Appears comfortable but sleepy,  Respiratory system: Clear to auscultation. Respiratory effort normal. Cardiovascular system: S1 & S2  heard, RRR. No JVD, murmurs, rubs, gallops or clicks. No pedal edema. Gastrointestinal system: Abdomen is soft slightly distended, tender in the RUQ, bowel sounds heard.  Central nervous system: sedated from the pain meds.  Extremities: Symmetric 5 x 5 power. Skin: No rashes, lesions or ulcers Psychiatry: sleepy , mood normal.     Data Reviewed: I have personally reviewed following labs and imaging studies  CBC: Recent Labs  Lab 11/13/17 0901 11/14/2017 1030 11/19/17 1000 11/20/17 0650  WBC 14.8* 10.0 12.1* 12.5*  NEUTROABS 12.4* 8.8* 10.3*  --   HGB  --  8.4* 8.6* 7.6*  HCT 22.2* 26.6* 27.1* 23.9*  MCV 90.4 90.5 89.1 89.2  PLT 318 276 268 010   Basic Metabolic Panel: Recent Labs  Lab 11/13/17 0901 12/06/2017 1030 11/19/17 1000 11/20/17 0650  NA 133* 135 141 145  K 3.6 3.9 4.0 3.6  CL 100 101 108 112*  CO2 23 19* 21* 23  GLUCOSE 293* 438* 128* 179*  BUN 15 36* 56* 65*  CREATININE 1.31* 1.50* 1.85* 1.67*  CALCIUM 9.1 8.8* 9.1 9.1   GFR: Estimated Creatinine Clearance: 38.5 mL/min (A) (by C-G formula based on SCr of 1.67 mg/dL (H)). Liver Function Tests: Recent Labs  Lab 11/13/17 0901  1030  AST 27 93*  ALT 20 53  ALKPHOS 287* 205*  BILITOT 2.9* 2.6*  PROT 6.8 6.8  ALBUMIN 1.6* 1.9*   Recent Labs  Lab 11/25/2017 1039  LIPASE 15   No results for input(s): AMMONIA in the last 168 hours. Coagulation Profile: No results for input(s): INR, PROTIME in the last 168 hours. Cardiac Enzymes: Recent Labs  Lab 11/12/2017 1030  TROPONINI <0.03   BNP (last 3 results) No results for input(s): PROBNP in the last 8760 hours. HbA1C: No results for input(s): HGBA1C in the last 72 hours. CBG: Recent Labs  Lab 11/19/17 0750 11/19/17 1216 11/19/17 1710 11/19/17 2134 11/20/17 0758  GLUCAP 126* 120* 161* 201* 159*   Lipid Profile: No results for input(s): CHOL, HDL, LDLCALC, TRIG, CHOLHDL, LDLDIRECT in the last 72 hours. Thyroid Function Tests: No results  for input(s): TSH, T4TOTAL, FREET4, T3FREE, THYROIDAB in the last 72 hours. Anemia Panel: No results for input(s): VITAMINB12, FOLATE, FERRITIN, TIBC, IRON, RETICCTPCT in the last 72 hours. Sepsis Labs: No results for input(s): PROCALCITON, LATICACIDVEN in the last 168 hours.  No results found for this or any previous visit (from the past 240 hour(s)).       Radiology Studies: Dg Chest 2 View  Result Date: 11/26/2017 CLINICAL DATA:  Shortness of breath.  Metastatic gallbladder cancer. EXAM: CHEST - 2 VIEW COMPARISON:  11/02/2017 FINDINGS: Lateral view degraded by patient arm position and poor inspiratory effort. The frontal radiograph is also of low lung volumes. Port-A-Cath terminates at the low SVC. Right hemidiaphragm elevation. Midline trachea. Cardiomegaly accentuated by AP portable technique. No pleural effusion or pneumothorax. Interstitial prominence is favored to be due to AP portable technique and low lung volumes. Mild right infrahilar  and medial lower lobe atelectasis. No free intraperitoneal air. IMPRESSION: Low lung volumes with mild right-sided volume loss and atelectasis. Electronically Signed   By: Abigail Miyamoto M.D.   On: 11/10/2017 11:10   US Abdomen Limited Ruq  Result Date: 11/19/2017 CLINICAL DATA:  Right upper quadrant abdominal pain. EXAM: ULTRASOUND ABDOMEN LIMITED RIGHT UPPER QUADRANT COMPARISON:  Ultrasound of November 02, 2017. CT scan of October 26, 2017. FINDINGS: Gallbladder: 10.2 x 10.2 x 8.5 cm predominantly solid abnormality is seen in gallbladder fossa consistent with history of gallbladder carcinoma. Positive sonographic Murphy's sign is noted. Common bile duct: Diameter: 6.2 mm which is within normal limits. Stent is noted within the common bile duct. Liver: Multiple masses are noted throughout hepatic parenchyma consistent with metastatic disease, the largest measuring 4.0 x 3.9 x 3.6 cm. Mild intrahepatic biliary dilatation is noted. Doppler demonstrates no  flow in portal vein concerning for thrombosis or occlusion. IMPRESSION: 10.2 cm predominantly solid mass seen in gallbladder fossa consistent with history of gallbladder carcinoma. Mild intrahepatic biliary dilatation is noted. Multiple hepatic masses are noted consistent with metastatic disease. No flow is noted in portal vein on Doppler, concerning for portal vein thrombosis or occlusion. These results will be called to the ordering clinician or representative by the Radiologist Assistant, and communication documented in the PACS or zVision Dashboard. Electronically Signed   By: Marijo Conception, M.D.   On: 11/19/2017 12:38        Scheduled Meds: . carvedilol  25 mg Oral BID WC  . docusate sodium  100 mg Oral BID  . fentaNYL  25 mcg Transdermal Q72H  . insulin aspart  0-5 Units Subcutaneous QHS  . insulin aspart  0-9 Units Subcutaneous TID WC  . insulin glargine  25 Units Subcutaneous BID  . isosorbide mononitrate  60 mg Oral BH-q7a  . latanoprost  1 drop Both Eyes QHS  . pantoprazole  40 mg Oral Q0600  . polyethylene glycol  17 g Oral BID  . protein supplement  8 oz Oral TID WC  . senna-docusate  1 tablet Oral BID  . ursodiol  300 mg Oral TID   Continuous Infusions: . sodium chloride 75 mL/hr at 11/20/17 0808  . 0.9 % NaCl with KCl 20 mEq / L       LOS: 1 day    Time spent: 35 minutes.     Reyne Dumas, MD [facility] Pager [phone]  If 7PM-7AM, please contact night-coverage [email or web address] Password [facility] 11/20/2017, 9:37 AM

## 2017-11-21 DIAGNOSIS — D259 Leiomyoma of uterus, unspecified: Secondary | ICD-10-CM

## 2017-11-21 DIAGNOSIS — I952 Hypotension due to drugs: Secondary | ICD-10-CM

## 2017-11-21 DIAGNOSIS — R7989 Other specified abnormal findings of blood chemistry: Secondary | ICD-10-CM

## 2017-11-21 DIAGNOSIS — R4182 Altered mental status, unspecified: Secondary | ICD-10-CM

## 2017-11-21 DIAGNOSIS — C779 Secondary and unspecified malignant neoplasm of lymph node, unspecified: Secondary | ICD-10-CM

## 2017-11-21 DIAGNOSIS — C787 Secondary malignant neoplasm of liver and intrahepatic bile duct: Secondary | ICD-10-CM

## 2017-11-21 DIAGNOSIS — R0603 Acute respiratory distress: Secondary | ICD-10-CM

## 2017-11-21 DIAGNOSIS — E119 Type 2 diabetes mellitus without complications: Secondary | ICD-10-CM

## 2017-11-21 DIAGNOSIS — G893 Neoplasm related pain (acute) (chronic): Principal | ICD-10-CM

## 2017-11-21 LAB — GLUCOSE, CAPILLARY
GLUCOSE-CAPILLARY: 134 mg/dL — AB (ref 65–99)
GLUCOSE-CAPILLARY: 100 mg/dL — AB (ref 65–99)
GLUCOSE-CAPILLARY: 56 mg/dL — AB (ref 65–99)
GLUCOSE-CAPILLARY: 88 mg/dL (ref 65–99)
Glucose-Capillary: 99 mg/dL (ref 65–99)
Glucose-Capillary: 64 mg/dL — ABNORMAL LOW (ref 65–99)
Glucose-Capillary: 49 mg/dL — ABNORMAL LOW (ref 65–99)
Glucose-Capillary: 82 mg/dL (ref 65–99)

## 2017-11-21 LAB — COMPREHENSIVE METABOLIC PANEL
ALT: 73 U/L — AB (ref 14–54)
AST: 106 U/L — ABNORMAL HIGH (ref 15–41)
Albumin: 1.5 g/dL — ABNORMAL LOW (ref 3.5–5.0)
Alkaline Phosphatase: 215 U/L — ABNORMAL HIGH (ref 38–126)
Anion gap: 11 (ref 5–15)
BUN: 68 mg/dL — ABNORMAL HIGH (ref 6–20)
CHLORIDE: 117 mmol/L — AB (ref 101–111)
CO2: 21 mmol/L — ABNORMAL LOW (ref 22–32)
CREATININE: 1.41 mg/dL — AB (ref 0.44–1.00)
Calcium: 9.2 mg/dL (ref 8.9–10.3)
GFR, EST AFRICAN AMERICAN: 45 mL/min — AB (ref >60)
GFR, EST NON AFRICAN AMERICAN: 39 mL/min — AB (ref >60)
Glucose, Bld: 116 mg/dL — ABNORMAL HIGH (ref 65–99)
Potassium: 3.9 mmol/L (ref 3.5–5.1)
Sodium: 149 mmol/L — ABNORMAL HIGH (ref 135–145)
TOTAL PROTEIN: 6.1 g/dL — AB (ref 6.5–8.1)
Total Bilirubin: 2.1 mg/dL — ABNORMAL HIGH (ref 0.3–1.2)

## 2017-11-21 LAB — CBC
HCT: 27.2 % — ABNORMAL LOW (ref 36.0–46.0)
Hemoglobin: 8.7 g/dL — ABNORMAL LOW (ref 12.0–15.0)
MCH: 28.9 pg (ref 26.0–34.0)
MCHC: 32 g/dL (ref 30.0–36.0)
MCV: 90.4 fL (ref 78.0–100.0)
PLATELETS: 224 10*3/uL (ref 150–400)
RBC: 3.01 MIL/uL — ABNORMAL LOW (ref 3.87–5.11)
RDW: 19 % — ABNORMAL HIGH (ref 11.5–15.5)
WBC: 11.2 10*3/uL — AB (ref 4.0–10.5)

## 2017-11-21 LAB — BLOOD GAS, ARTERIAL
Acid-base deficit: 6.4 mmol/L — ABNORMAL HIGH (ref 0.0–2.0)
Bicarbonate: 17 mmol/L — ABNORMAL LOW (ref 20.0–28.0)
DRAWN BY: 331471
O2 Content: 2 L/min
O2 Saturation: 94.8 %
PCO2 ART: 27.9 mmHg — AB (ref 32.0–48.0)
PH ART: 7.402 (ref 7.350–7.450)
Patient temperature: 98.6
pO2, Arterial: 80.7 mmHg — ABNORMAL LOW (ref 83.0–108.0)

## 2017-11-21 LAB — AMMONIA: AMMONIA: 76 umol/L — AB (ref 9–35)

## 2017-11-21 LAB — MRSA PCR SCREENING: MRSA by PCR: NEGATIVE

## 2017-11-21 MED ORDER — DEXTROSE 5 % IV SOLN
INTRAVENOUS | Status: DC
  Administered 2017-11-21: 13:00:00 via INTRAVENOUS

## 2017-11-21 MED ORDER — DEXTROSE-NACL 5-0.9 % IV SOLN: INTRAVENOUS | Status: DC

## 2017-11-21 MED ORDER — HYDROCODONE-ACETAMINOPHEN 5-325 MG PO TABS: 1.0000 | ORAL_TABLET | Freq: Four times a day (QID) | ORAL | Status: DC | PRN

## 2017-11-21 MED ORDER — DEXTROSE 50 % IV SOLN
INTRAVENOUS | Status: AC
  Administered 2017-11-21: 08:00:00
  Filled 2017-11-21: qty 50

## 2017-11-21 MED ORDER — LIP MEDEX EX OINT
TOPICAL_OINTMENT | CUTANEOUS | Status: AC
  Administered 2017-11-21: 17:00:00
  Filled 2017-11-21: qty 7

## 2017-11-21 MED ORDER — SODIUM CHLORIDE 0.9 % IV BOLUS (SEPSIS)
1000.0000 mL | Freq: Once | INTRAVENOUS | Status: AC
  Administered 2017-11-21: 1000 mL via INTRAVENOUS

## 2017-11-21 MED ORDER — MORPHINE SULFATE (PF) 4 MG/ML IV SOLN: 1.0000 mg | INTRAVENOUS | Status: DC | PRN

## 2017-11-21 MED ORDER — DEXTROSE 50 % IV SOLN
INTRAVENOUS | Status: AC
  Administered 2017-11-21: 25 mL
  Filled 2017-11-21: qty 50

## 2017-11-21 MED ORDER — LACTULOSE ENEMA
300.0000 mL | Freq: Once | ORAL | Status: DC
  Filled 2017-11-21: qty 300

## 2017-11-21 MED ORDER — LACTULOSE 10 GM/15ML PO SOLN
10.0000 g | Freq: Three times a day (TID) | ORAL | Status: DC
  Administered 2017-11-21: 10 g via ORAL
  Filled 2017-11-21: qty 15

## 2017-11-21 MED ORDER — MORPHINE 100MG IN NS 100ML (1MG/ML) PREMIX INFUSION
2.0000 mg/h | INTRAVENOUS | Status: DC
  Administered 2017-11-21: 2 mg/h via INTRAVENOUS
  Filled 2017-11-21: qty 100

## 2017-11-21 MED ORDER — NALOXONE HCL 0.4 MG/ML IJ SOLN
0.4000 mg | Freq: Once | INTRAMUSCULAR | Status: AC
  Administered 2017-11-21: 0.4 mg via INTRAVENOUS
  Filled 2017-11-21: qty 1

## 2017-11-21 MED ORDER — DEXTROSE-NACL 5-0.9 % IV SOLN
INTRAVENOUS | Status: DC
  Administered 2017-11-21: 12:00:00 via INTRAVENOUS

## 2017-11-21 NOTE — Progress Notes (Signed)
Pt TOD 2330. Family at bedside.  No hearts sounds heard upon auscultation, verified by 2 RNs. CDS notified. CDS- referral number- [id number] CDC agent name: Bearl Mulberry

## 2017-11-21 NOTE — Significant Event (Addendum)
Patient became hypotensive on the floor.  Blood pressure was in the range of 80/40s.  Patient moved to stepdown unit.  Given a bolus of IV fluid and given a dose of Narcan.  PCCM consulted. Fentanyl discontinued. Improvement of blood pressure after the IV fluid bolus. Held an extensive discussion with the family. Dr Elsworth Soho also discussed with them. Code status will be changed to DNR/DNI. Expect initiation of comfort care measures as per Palliative care in near future.

## 2017-11-21 NOTE — Progress Notes (Signed)
Patients BP 66/33 HR 72 respirations 32 oxygen 92% 2 Liters. Patient lethargic and diaphoretic CBG done 88. Notified Dr. Tawanna Solo and Dr. Benay Spice of situation new orders received.

## 2017-11-21 NOTE — Progress Notes (Addendum)
Hypoglycemic Event  CBG: 64  Treatment: pt lethargic, unable to tolerate PO. 1/2 amp D50 given per protocol  Symptoms: lethargic, tachypnea  Comments/MD notified: Olene Floss

## 2017-11-21 NOTE — Consult Note (Signed)
Name: Julie Barker MRN: [id number] DOB: Apr 29, 1956    ADMISSION DATE:  11/29/2017 CONSULTATION DATE:  11/15/2017  REFERRING MD :  Farris Has  CHIEF COMPLAINT: Respiratory distress    HISTORY OF PRESENT ILLNESS: 62 year old woman with recent diagnosis of metastatic gallbladder carcinoma with liver metastases admitted 3/10 for persistent right upper quadrant abdominal pain with nausea and vomiting.  Outpatient pain control was not satisfactory with hydrocodone.  She was given Dilaudid in the ED and was treated with a fentanyl pain patch.  Her first cycle of chemotherapy was on 3/5 Palliative care was following. She was transferred to the ICU today due to being somnolent with slight high ammonia level.  She was also hypoglycemic and lethargic.  Some respiratory distress was noted.   PAST MEDICAL HISTORY :   has a past medical history of Coronary artery disease, Diabetes mellitus, Duodenal ulcer: Per EGD 10/18/2017 (10/18/2017), Esophageal ulceration: EGD 10/18/2017 (10/18/2017), Fibroid tumor, Hyperlipidemia, Hypertension, Liver cancer ([place]), Primary cancer of gallbladder with metastasis to other site [facility]), and Type 2 diabetes mellitus without complication ([place]) (03/16/1606).  has a past surgical history that includes Cardiac catheterization (04/05/2007); Cardiovascular stress test (02/28/2007); Colonoscopy with propofol (N/A, 05/31/2015); Esophagogastroduodenoscopy (egd) with propofol (Left, 10/18/2017); Mouth surgery; IR FLUORO GUIDE PORT INSERTION RIGHT (10/29/2017); IR US Guide Vasc Access Right (10/29/2017); and ERCP (N/A, 10/30/2017). Prior to Admission medications   Medication Sig Start Date End Date Taking? Authorizing Provider  carvedilol (COREG) 25 MG tablet TAKE ONE TABLET BY MOUTH TWICE DAILY WITH MEALS Patient taking differently: TAKE 25 MG BY MOUTH TWICE DAILY WITH MEALS 05/23/12  Yes Burtis Junes, NP  HYDROcodone-acetaminophen (NORCO/VICODIN) 5-325 MG tablet Take 0.5-1 tablets by  mouth every 6 (six) hours as needed for severe pain. 11/15/17  Yes Ladell Pier, MD  insulin aspart (NOVOLOG FLEXPEN) 100 UNIT/ML FlexPen Inject 2-10 Units into the skin 2 (two) times daily. Per sliding scale   Yes [provider]  insulin glargine (LANTUS) 100 UNIT/ML injection Inject 25 Units into the skin 2 (two) times daily.    Yes [provider]  isosorbide mononitrate (IMDUR) 60 MG 24 hr tablet Take 60 mg by mouth every morning.    Yes [provider]  latanoprost (XALATAN) 0.005 % ophthalmic solution Place 1 drop into both eyes at bedtime.    Yes [provider]  lidocaine-prilocaine (EMLA) cream Apply 1hr prior to port use. Do not rub in. Cover with plastic wrap. 11/13/17  Yes Ladell Pier, MD  ondansetron (ZOFRAN) 8 MG tablet Take 1 tablet (8 mg total) by mouth every 8 (eight) hours as needed for nausea or vomiting. 11/01/17  Yes Ladell Pier, MD  ondansetron (ZOFRAN-ODT) 4 MG disintegrating tablet Take 4 mg by mouth every 6 (six) hours as needed for nausea or vomiting. 10/16/17  Yes [provider]  pantoprazole (PROTONIX) 40 MG tablet Take 1 tablet (40 mg total) by mouth 2 (two) times daily before a meal. 10/20/17  Yes Eugenie Filler, MD  polyethylene glycol (MIRALAX / GLYCOLAX) packet Take 17 g by mouth daily. 10/21/17  Yes Eugenie Filler, MD  traMADol (ULTRAM) 50 MG tablet Take 1 tablet (50 mg total) by mouth every 12 (twelve) hours as needed for moderate pain or severe pain. 11/01/17  Yes Ladell Pier, MD  ursodiol (ACTIGALL) 300 MG capsule Take 1 capsule (300 mg total) by mouth 3 (three) times daily. 10/20/17  Yes Eugenie Filler, MD  colchicine 0.6 MG  tablet Take 0.6 mg by mouth 2 (two) times daily as needed. At the start of gout symptoms    [provider]  prochlorperazine (COMPAZINE) 10 MG tablet Take 1 tablet (10 mg total) by mouth every 6 (six) hours as needed for nausea or vomiting. 10/25/17   Ladell Pier, MD     No Known Allergies  FAMILY HISTORY:  family history includes Aneurysm in her mother; Diabetes in her sister and sister; Heart attack in her father; Hypertension in her brother, brother, brother, sister, and sister; Rheumatic fever in her sister; Stroke in her father. SOCIAL HISTORY:  reports that she quit smoking about 19 years ago. she has never used smokeless tobacco. She reports that she does not drink alcohol or use drugs.  REVIEW OF SYSTEMS:   Unable to obtain in detail since patient was in distress  SUBJECTIVE:   VITAL SIGNS: Temp:  [97.6 F (36.4 C)-98.2 F (36.8 C)] 97.6 F (36.4 C) (03/13 1219) Pulse Rate:  [73-85] 80 (03/13 1600) Resp:  [18-42] 35 (03/13 1600) BP: (73-125)/(40-70) 84/47 (03/13 1600) SpO2:  [88 %-99 %] 96 % (03/13 1600)  PHYSICAL EXAMINATION:  Gen. appears older than stated age,, well-nourished, in moderate distress, complaining of thirst and flailing her arms ENT - no lesions, no post nasal drip Neck: No JVD, no thyromegaly, no carotid bruits Lungs: Mild use of accessory muscles, no dullness to percussion, clear without rales or rhonchi  Cardiovascular: Rhythm regular, heart sounds  normal, no murmurs or gallops, no peripheral edema Abdomen: Distended, right upper quadrant tender, no hepatosplenomegaly, BS normal. Musculoskeletal: No deformities, no cyanosis or clubbing Neuro:  alert, non focal    Recent Labs  Lab 11/19/17 1000 11/20/17 0650 11/13/2017 0424  NA 141 145 149*  K 4.0 3.6 3.9  CL 108 112* 117*  CO2 21* 23 21*  BUN 56* 65* 68*  CREATININE 1.85* 1.67* 1.41*  GLUCOSE 128* 179* 116*   Recent Labs  Lab 11/19/17 1000 11/20/17 0650 11/14/2017 0424  HGB 8.6* 7.6* 8.7*  HCT 27.1* 23.9* 27.2*  WBC 12.1* 12.5* 11.2*  PLT 268 199 224   No results found.  ASSESSMENT / PLAN:  Acute respiratory distress and hypotension-related to narcotics, also some degree of somnolence.  She was given Narcan 0.4 mg in breathing and blood  pressure improved.  I had a frank discussion with the family regarding goals of care.  Patient appears to be in pain and does not need pain medications.  She is also bleeding with the family to be given some water to drink.  Clearly aggressive care is conflicting with comfort measures. Husband and daughter agreed that comfort was important.  I explained what life support measures involved and they agreed to DNR status. Would reinstitute pain medications with dose care limitations. Palliative care can take the discussion from there. PCCM  to be available as needed    Kara Mead MD. FCCP. [facility] Pager [phone] If no response call [phone]    , 4:42 PM

## 2017-11-21 NOTE — Progress Notes (Signed)
Hypoglycemic Event  CBG: 49  Treatment: 1/2amp D50 per protocol  Symptoms: pt very lethargic   Comments/MD notified: MD paged   Julie Barker

## 2017-11-21 NOTE — Progress Notes (Signed)
Hypoglycemic Event   Follow-up CBG: Time:0800   CBG Result: 134  Pt more alert. No other symptoms at this time.      Julie Barker

## 2017-11-21 NOTE — Progress Notes (Signed)
Hypoglycemic Event    Follow-up CBG: Time:1215 CBG Result:82    [place]

## 2017-11-21 NOTE — Progress Notes (Addendum)
Pt unable to follow commands, lethargic, hypotensive with 71/41 BP, RR 35.  Paged on call Provider regarding further instruction & goals of care. Notes from consults from day shift today all state family wants Pt to be comfortable, however comfort care orders have not been placed.  RN spoke with Pt's husband and daughter at length regarding goals of care, and explained comfort care measures in detail. Both husband and daughter state they do not want aggressive treatment, and have both agreed it is best to transition patient to full comfort care.  RN notified on call Provider regarding families wishes. Provider given verbal orders to initiate full comfort care for patient, and cease all current scheduled medications and lab draws.

## 2017-11-21 NOTE — Progress Notes (Signed)
Daily Progress Note   Patient Name: Julie Barker       Date: 11/10/2017 DOB: May 24, 1956  Age: 62 y.o. MRN#: [id number] Attending Physician: Marene Lenz, MD Primary Care Physician: Wenda Low, MD Admit Date: 11/10/2017  Reason for Consultation/Follow-up: Establishing goals of care and Pain control  Subjective: Patient wakes to voice but remains drowsy during conversation. RN at bedside. CBG 82 and BP soft. Patient transferring to tele bed for closer monitoring.  GOC:   Daughter, husband, and brother at bedside. We discussed her pain management in the last 24 hours. Daughter, [name] feels her mother cannot explain how bad she is hurting, just saying 'yes' when Cecily asks. We discussed looking at nonverbal s/s of pain. Also discussed her being drowsy/lethargic with hypoglycemic episodes and high ammonia level. RN initiated IVF with dextrose and lactulose enema is ordered. We also discussed pain medication contributing to altered mental status.   Daughter is hopeful to see Dr. Benay Spice early this evening. She also requests to speaks with hospital attending (who she missed this morning when he rounded).   Length of Stay: 2  Current Medications: Scheduled Meds:  . carvedilol  25 mg Oral BID WC  . docusate sodium  100 mg Oral BID  . feeding supplement  1 Container Oral TID BM  . fentaNYL  25 mcg Transdermal Q72H  . insulin aspart  0-5 Units Subcutaneous QHS  . insulin aspart  0-9 Units Subcutaneous TID WC  . isosorbide mononitrate  60 mg Oral BH-q7a  . lactulose  10 g Oral TID  . lactulose  300 mL Rectal Once  . latanoprost  1 drop Both Eyes QHS  . nystatin  5 mL Oral QID  . OLANZapine zydis  5 mg Oral QHS  . pantoprazole  40 mg Oral Q0600  . polyethylene glycol  17 g  Oral BID  . protein supplement  8 oz Oral TID WC  . senna-docusate  1 tablet Oral BID  . sodium chloride flush  10-40 mL Intracatheter Q12H  . ursodiol  300 mg Oral TID    Continuous Infusions: . dextrose 5 % and 0.9% NaCl 75 mL/hr at 11/18/2017 1222  . fluconazole (DIFLUCAN) IV 100 mg (11/10/2017 1221)    PRN Meds: acetaminophen **OR** acetaminophen, HYDROcodone-acetaminophen, morphine injection, ondansetron **OR** ondansetron (ZOFRAN) IV, sodium  chloride flush  Physical Exam  Constitutional: She appears lethargic. She appears ill.  HENT:  Head: Normocephalic and atraumatic.  Pulmonary/Chest: Accessory muscle usage present. No tachypnea. No respiratory distress.  Neurological: She appears lethargic.  Opens eyes, not following commands  Skin: Skin is warm and dry.  Psychiatric: Cognition and memory are impaired. She is noncommunicative. She is inattentive.  Nursing note and vitals reviewed.          Vital Signs: BP (!) 97/59 (BP Location: Right Arm) Comment: RN notified   Pulse 77   Temp 97.6 F (36.4 C) (Oral)   Resp (!) 35   Ht 5\' 8"  (1.727 m)   Wt 81 kg (178 lb 9.6 oz)   SpO2 95%   BMI 27.16 kg/m  SpO2: SpO2: 95 % O2 Device: O2 Device: Room Air O2 Flow Rate:    Intake/output summary:   Intake/Output Summary (Last 24 hours) at 11/10/2017 1240 Last data filed at 12/01/2017 0840 Gross per 24 hour  Intake 3340 ml  Output 450 ml  Net 2890 ml   LBM: Last BM Date: 11/20/2017 Baseline Weight: Weight: 81 kg (178 lb 9.6 oz) Most recent weight: Weight: 81 kg (178 lb 9.6 oz)       Palliative Assessment/Data: PPS 50%   Flowsheet Rows     Most Recent Value  Intake Tab  Referral Department  Hospitalist  Unit at Time of Referral  Oncology Unit  Palliative Care Primary Diagnosis  Cancer  Date Notified  11/19/17  Palliative Care Type  New Palliative care  Reason for referral  Pain  Date of Admission  11/26/2017  Date first seen by Palliative Care  11/20/17  # of days  Palliative referral response time  1 Day(s)  # of days IP prior to Palliative referral  1  Clinical Assessment  Palliative Performance Scale Score  50%  Psychosocial & Spiritual Assessment  Palliative Care Outcomes  Patient/Family meeting held?  Yes  Who was at the meeting?  husband, daughter, brother  Palliative Care Outcomes  Improved pain interventions, Improved non-pain symptom therapy, Clarified goals of care      Patient Active Problem List   Diagnosis Date Noted  . Increased ammonia level 11/27/2017  . Metastatic adenocarcinoma ([place])   . Intractable pain   . Oral thrush   . Palliative care by specialist   . Malnutrition of moderate degree 11/19/2017  . Inadequate pain control 11/09/2017  . Port-A-Cath in place 11/13/2017  . Sepsis associated hypotension ([place]) 11/02/2017  . Septic shock ([place]) 11/02/2017  . Gallbladder cancer ([place]) 10/22/2017  . Goals of care, counseling/discussion 10/22/2017  . Acute lower UTI 10/20/2017  . Symptomatic anemia 10/18/2017  . Duodenal ulcer: Per EGD 10/18/2017 10/18/2017  . Esophageal ulceration: EGD 10/18/2017 10/18/2017  . Type 2 diabetes mellitus without complication ([place]) 02/72/5366  . Hyponatremia 10/18/2017  . Anemia 10/17/2017  . Liver cancer ([place])   . CAD (coronary artery disease) 08/02/2011  . HTN (hypertension) 08/02/2011  . Hyperlipidemia 08/02/2011    Palliative Care Assessment & Plan   Patient Profile: 62 y.o. female  with past medical history of metastatic gallbladder cancer, hypertension, hyperlipidemia, type 2 diabetes mellitus, CAD, and esophageal/duodenal ulcers s/p EGD admitted on 12/01/2017 with right upper quadrant pain associated with nausea and vomiting for 3-4 days. Recent diagnosis of metastatic gallbladder adenocarcinoma followed by Dr. Benay Spice. First chemotherapy was on 11/13/17. Recent hospitalizations in February for anemia/duodenal and esophageal ulcerations and sepsis/cholangitis. This admission, [place] of RUQ shows  slight increase in gb tumour size to 10cm. Patient was started on Fentanyl 23mcg patch along with Vicodin 2tabs q6h prn and Morphine 1-2mg  IV q2h prn. Palliative medicine consultation for pain management.   Assessment: Severe and intractable abdominal pain  Metastatic gallbladder adenocarcinoma Oral thrush Hypoglycemia Hypotensive AKI on CKD stage II Type 2 diabetes mellitus  Recommendations/Plan:  Continue FULL code/FULL scope  Daughter requesting to speak to attending during my visit. Spoke with Dr. Tawanna Solo who will contact her this afternoon.   Daughter hopeful to see Dr. Benay Spice this evening.   Will not make pain medication adjustments today. Patient lethargic with hypoglycemia and elevated ammonia level. Management per attending.   PMT will continue to follow and further discuss [place] after family speaks with attending and oncology.   Goals of Care and Additional Recommendations:  Limitations on Scope of Treatment: Full Scope Treatment  Code Status: FULL   Code Status Orders  (From admission, onward)        Start     Ordered   11/14/2017 1345  Full code  Continuous     12/05/2017 1344    Code Status History    Date Active Date Inactive Code Status Order ID Comments User Context   11/02/2017 06:50 11/05/2017 20:34 Full Code [id number]  Reyne Dumas, MD ED   10/17/2017 17:28 10/20/2017 21:50 Full Code [id number]  Florencia Reasons, MD Inpatient       Prognosis:   Unable to determine  Discharge Planning:  To Be Determined  Care plan was discussed with daughter, husband, brother at bedside, RN, Dr. Tawanna Solo  Thank you for allowing the Palliative Medicine Team to assist in the care of this patient.   Time In: 1205 Time Out: 1230 Total Time 21min Prolonged Time Billed  no      Greater than 50%  of this time was spent counseling and coordinating care related to the above assessment and plan.  Ihor Dow, FNP-C Palliative Medicine Team  Phone: [phone] Fax:  [phone]  Please contact Palliative Medicine Team phone at [phone] for questions and concerns.

## 2017-11-21 NOTE — Progress Notes (Addendum)
PROGRESS NOTE    Julie Barker  IWP:[id number] DOB: [birth year] DOA: 11/29/2017 PCP: Wenda Low, MD   Brief Narrative:  Julie Barker a 62 y.o.femalewith medical history significant ofmetastatic gallbladder carcinoma,status post chemo on 5 March by Dr.Sherrill,chronic anemia history of GI bleed, comes in for persistent right quadrant abdominal pain associated with nausea and vomiting since 3-4 days. She was started on hydrocodone without much relief. she was referred to [facility] for admission for pain control. She was started on dilaudid , but she was pretty much sedated from it and not eating or drinking. Oncology has seen her and provided recommendations. Found to be in acute kidney injury because of dehydration which is improving. Palliative care has been consulted for pain management. Patient look lethargic this morning.  She was also hypoglycemic and her ammonia level was high.      Assessment & Plan:   Active Problems:   HTN (hypertension)   Liver cancer (HCC)   Anemia   Symptomatic anemia   Duodenal ulcer: Per EGD 10/18/2017   Esophageal ulceration: EGD 10/18/2017   Type 2 diabetes mellitus without complication (HCC)   Gallbladder cancer (HCC)   Inadequate pain control   Malnutrition of moderate degree   Metastatic adenocarcinoma ([place])   Intractable pain   Oral thrush   Palliative care by specialist   Increased ammonia level  Altered mental status/lethargic: Patient is lethargic and somnolent.  Could likely be associated  with hypoglycemia and increased ammonia level.     Hypoglycemia: Long-acting insulin has been stopped. hypoglycemia could be due to failed gluconeogenesis because of liver mets.  She has been started on D5W  Increase ammonia level: Likely secondary to liver metastasis.  Continue lactulose enema.  Hypotension: Could also be considered with pain medications.  Continue gentle IV fluids.  Antihypertensives have been discontinued.  Severe  and intractable abdominal pain from metastatic gallbladder carcinoma Palliative care following. Started on fentanyl patch, Vicodin 2 tablets every 6 hours as needed, morphine 2 mg IV every 2 when necessary Continue IV fluids, aggressive constipation regimen US of the RUQ shows slight increase in the gb tumour size to 10 cm. Also showed liver metastases  Metastatic gallbladder adenocarcinoma patient received first dose of FOLFOX on 11/13/2017  Further management as per oncology. According to the family Dr. Benay Spice is out of town and will be back today to make   further decisions Follow LFTs closely  Acute on stage II CKD creatinine slightly worse than baseline currently creatinine is 1.8.. Prerenal due to poor oral intake. Improving with IV fluids   Anemia from duodenal ulcerper EGD on 10/18/2017 Status post 2 units of PRBC transfusion recently. Hemoglobin stable around 8.4. Transfuse to keep hemoglobin greater than 7. Monitor counts serially. Continue PPI  Type 2 diabetes mellitus Patient is hypoglycemic now.  Continue on a sliding scale insulin if needed.  Oral thrush Started on fluconazole   DVT prophylaxis:SCD Code Status: Full Family Communication: Husband present at the bedside Disposition Plan: Unknown at this point   Consultants: Palliative care, oncology  Procedures: None  Antimicrobials: Fluconazole  Subjective: Patient seen and examined the bedside this morning.  Looks extremely somnolent and lethargic.  Also complained of abdominal pain .Discussed with palliative care  about  current condition.  I think she is a candidate for hospice.  Objective: Vitals:   11/29/2017 0513 11/28/2017 0750 11/25/2017 0800  1219  BP: (!) 107/53   (!) 97/59  Pulse: 85   77  Resp: 18 (!)  35 (!) 22 (!) 35  Temp: 98.2 F (36.8 C)   97.6 F (36.4 C)  TempSrc: Oral   Oral  SpO2: 95% 95%  95%  Weight:      Height:        Intake/Output Summary (Last 24 hours) at  11/28/2017 1237 Last data filed at 11/23/2017 0840 Gross per 24 hour  Intake 3340 ml  Output 450 ml  Net 2890 ml   Filed Weights   11/09/2017 1350  Weight: 81 kg (178 lb 9.6 oz)    Examination:  General exam: Chronically ill, somnolent, lethargic HEENT:PERRL,Oral mucosa moist, Ear/Nose normal on gross exam Respiratory system: Bilateral equal air entry, normal vesicular breath sounds, no wheezes or crackles  Cardiovascular system: S1 & S2 heard, RRR. No JVD, murmurs, rubs, gallops or clicks. No pedal edema. Gastrointestinal system: Abdomen is distended and tender Central nervous system: Not oriented . No focal neurological deficits. Extremities: No edema, no clubbing ,no cyanosis, distal peripheral pulses palpable. Skin: No rashes, lesions or ulcers,no icterus ,no pallor MSK: Normal muscle bulk,tone ,power   Data Reviewed: I have personally reviewed following labs and imaging studies  CBC: Recent Labs  Lab 11/19/2017 1030 11/19/17 1000 11/20/17 0650 11/12/2017 0424  WBC 10.0 12.1* 12.5* 11.2*  NEUTROABS 8.8* 10.3*  --   --   HGB 8.4* 8.6* 7.6* 8.7*  HCT 26.6* 27.1* 23.9* 27.2*  MCV 90.5 89.1 89.2 90.4  PLT 276 268 199 536   Basic Metabolic Panel: Recent Labs  Lab 12/07/2017 1030 11/19/17 1000 11/20/17 0650 11/20/2017 0424  NA 135 141 145 149*  K 3.9 4.0 3.6 3.9  CL 101 108 112* 117*  CO2 19* 21* 23 21*  GLUCOSE 438* 128* 179* 116*  BUN 36* 56* 65* 68*  CREATININE 1.50* 1.85* 1.67* 1.41*  CALCIUM 8.8* 9.1 9.1 9.2  MG  --   --  1.5*  --    GFR: Estimated Creatinine Clearance: 45.6 mL/min (A) (by C-G formula based on SCr of 1.41 mg/dL (H)). Liver Function Tests: Recent Labs  Lab 11/26/2017 1030 11/20/17 1145 12/03/2017 0424  AST 93* 178* 106*  ALT 53 79* 73*  ALKPHOS 205* 176* 215*  BILITOT 2.6* 2.1* 2.1*  PROT 6.8 5.9* 6.1*  ALBUMIN 1.9* 1.5* 1.5*   Recent Labs  Lab 11/15/2017 1039  LIPASE 15   Recent Labs  Lab 11/20/17 1145 12/08/2017 1020  AMMONIA 72* 76*     Coagulation Profile: No results for input(s): INR, PROTIME in the last 168 hours. Cardiac Enzymes: Recent Labs  Lab 12/07/2017 1030  TROPONINI <0.03   BNP (last 3 results) No results for input(s): PROBNP in the last 8760 hours. HbA1C: No results for input(s): HGBA1C in the last 72 hours. CBG: Recent Labs  Lab 12/08/2017 0510 12/02/2017 0739 11/26/2017 0801 11/12/2017 1153 11/23/2017 1214  GLUCAP 99 64* 134* 49* 82   Lipid Profile: No results for input(s): CHOL, HDL, LDLCALC, TRIG, CHOLHDL, LDLDIRECT in the last 72 hours. Thyroid Function Tests: Recent Labs    11/20/17 1145  TSH 0.487  FREET4 0.86   Anemia Panel: No results for input(s): VITAMINB12, FOLATE, FERRITIN, TIBC, IRON, RETICCTPCT in the last 72 hours. Sepsis Labs: No results for input(s): PROCALCITON, LATICACIDVEN in the last 168 hours.  No results found for this or any previous visit (from the past 240 hour(s)).       Radiology Studies: No results found.      Scheduled Meds: . carvedilol  25 mg Oral BID WC  .  docusate sodium  100 mg Oral BID  . feeding supplement  1 Container Oral TID BM  . fentaNYL  25 mcg Transdermal Q72H  . insulin aspart  0-5 Units Subcutaneous QHS  . insulin aspart  0-9 Units Subcutaneous TID WC  . isosorbide mononitrate  60 mg Oral BH-q7a  . lactulose  10 g Oral TID  . lactulose  300 mL Rectal Once  . latanoprost  1 drop Both Eyes QHS  . nystatin  5 mL Oral QID  . OLANZapine zydis  5 mg Oral QHS  . pantoprazole  40 mg Oral Q0600  . polyethylene glycol  17 g Oral BID  . protein supplement  8 oz Oral TID WC  . senna-docusate  1 tablet Oral BID  . sodium chloride flush  10-40 mL Intracatheter Q12H  . ursodiol  300 mg Oral TID   Continuous Infusions: . dextrose 5 % and 0.9% NaCl 75 mL/hr at 12/02/2017 1222  . fluconazole (DIFLUCAN) IV 100 mg (11/29/2017 1221)     LOS: 2 days    Time spent: More than 50% of that time was spent in counseling and/or coordination of  care.      Marene Lenz, MD [facility] Pager [phone]  If 7PM-7AM, please contact night-coverage [email or web address] Password [facility] 11/13/2017, 12:37 PM

## 2017-11-21 NOTE — Progress Notes (Signed)
Patient BP 85/41, HR 82,respirations 45 oxygen level 93 on 2l nasal cannula. Patient extremely lethargic family at bedside. Dr Tawanna Solo notified new orders received

## 2017-11-21 NOTE — Progress Notes (Signed)
IP PROGRESS NOTE  Subjective:   Julie Barker completed a first cycle of FOLFOX on 11/13/2017.  Her family report she tolerated the chemotherapy without acute nausea or neuropathy symptoms.  She was admitted on 11/24/2017 with intractable abdominal pain.  She continues to have abdominal pain has become increasingly confused.  Multiple family members are at the bedside.   Objective: Vital signs in last 24 hours: Blood pressure (!) 84/47, pulse 80, temperature (!) 97.2 F (36.2 C), temperature source Axillary, resp. rate (!) 35, height 5\' 8"  (1.727 m), weight 178 lb 9.6 oz (81 kg), SpO2 96 %.  Intake/Output from previous day: 03/12 0701 - 03/13 0700 In: 2817.5 [P.O.:120; I.V.:2647.5; IV Piggyback:50] Out: 450 [Urine:450]  Physical Exam:  HEENT: Sclera are anicteric, the pupils are 1-2 mm, thrush over the tongue Lungs: Clear anteriorly, increased respiratory rate Cardiac: Regular rate and rhythm Abdomen: Distended, tender in the right abdomen Extremities: No leg edema Neurologic: Lethargic, arousable, follows some simple commands, speaks   Portacath/PICC-without erythema  Lab Results: Recent Labs    11/20/17 0650 11/13/2017 0424  WBC 12.5* 11.2*  HGB 7.6* 8.7*  HCT 23.9* 27.2*  PLT 199 224    BMET Recent Labs    11/20/17 0650 11/20/2017 0424  NA 145 149*  K 3.6 3.9  CL 112* 117*  CO2 23 21*  GLUCOSE 179* 116*  BUN 65* 68*  CREATININE 1.67* 1.41*  CALCIUM 9.1 9.2    Lab Results  Component Value Date   CEA1 2.55 11/13/2017     Medications: I have reviewed the patient's current medications.  Assessment/Plan: 1.Metastatic adenocarcinoma, biopsy of a segment 5 liver mass 09/21/2017-adenocarcinoma  CT abdomen/pelvis 08/17/2017-gallbladder fossa mass, upper abdominal lymphadenopathy, liver metastases  MRI of the liver 10/14/2017-masslike appearance of the gallbladder with direct invasion of the adjacent liver, multiple hepatic metastases, upper abdominal nodal  metastases  CT 10/26/2017- multiple liver metastases, intrahepatic biliary dilatation secondary to gallbladder mass and porta hepatis adenopathy  Cycle 1 FOLFOX 11/13/2017  2.Severe anemia secondary to GI bleeding  Duodenal bulb ulcer noted on endoscopy 10/18/2017, biopsied-inflammation/necrosis consistent with ulceration, focal atypical epithelioid cells of unknown significance   3.Anorexia/weight loss  4.Diabetes  5.Uterine fibroids  6.Biliary obstruction secondary to the gallbladder tumor/adenopathy-status post ERCP and placement of a right hepatic duct stent 10/30/2017  7.Admission with sepsis syndrome 11/02/2017, likely biliary sepsis  8.  Pain secondary to extensive disease involving the liver and upper abdominal adenopathy  9.   Altered mental status secondary to narcotic analgesics and hepatic encephalopathy   Julie Barker has metastatic adenocarcinoma, likely of gallbladder origin.  She completed a first cycle of FOLFOX chemotherapy 11/13/2017.  She is now admitted with intractable pain.  The pain is likely secondary to metastatic disease involving the liver and upper abdominal lymph nodes.  Julie Barker was very lethargic when I saw her at approximately 1:30 PM today.  She received Narcan and became more alert.  I return to see Julie Barker and discussed the situation with multiple family members at approximately 5 PM.  She was more alert, but remains confused.  The abdomen is distended.  The lungs are clear, but the respiratory rate is increased.  They understand she has an incurable malignancy.  I agree with the decision for a no CODE BLUE status.  Her prognosis is poor.  If her clinical status does not improve over the next few days we will need to consider Hospice care.  It appears it will be difficult for her  to tolerate high doses of narcotics.  I will avoid Toradol secondary to the renal insufficiency.  She may benefit from a palliative paracentesis if  there is significant ascites.  Recommendations: 1.  Discontinue Duragesic patch and morphine. 2.  Trial of oral lactulose 3.  Hold narcotics for sedation 4.  Consider repeat abdominal imaging to look for evidence of ascites and acute portal vein thrombosis.       LOS: 2 days   Betsy Coder, MD   11/30/2017, 5:17 PM

## 2017-11-22 ENCOUNTER — Encounter (HOSPITAL_COMMUNITY): Payer: Self-pay | Admitting: Oncology

## 2017-11-22 ENCOUNTER — Other Ambulatory Visit: Payer: Self-pay | Admitting: Nurse Practitioner

## 2017-11-23 ENCOUNTER — Ambulatory Visit: Payer: BLUE CROSS/BLUE SHIELD | Admitting: Nurse Practitioner

## 2017-11-23 ENCOUNTER — Other Ambulatory Visit: Payer: BLUE CROSS/BLUE SHIELD

## 2017-11-28 ENCOUNTER — Ambulatory Visit: Payer: BLUE CROSS/BLUE SHIELD

## 2017-11-28 ENCOUNTER — Other Ambulatory Visit: Payer: BLUE CROSS/BLUE SHIELD

## 2017-11-28 ENCOUNTER — Encounter: Payer: BLUE CROSS/BLUE SHIELD | Admitting: Nutrition

## 2017-12-10 NOTE — Progress Notes (Signed)
80 ml morphine gtt wasted in sink with Hinton Dyer, Therapist, sports.

## 2017-12-10 NOTE — Death Summary Note (Signed)
Death Summary  Julie Barker [facility]:[id number] DOB: [birth year] DOA: 12-Dec-2017  PCP: Wenda Low, MD  Admit date: 2017-12-12 Date of Death: 2017-12-15 Time of Death: December 31, 2328  History of present illness:  Julie Barker a 62 y.o.femalewith medical history significant ofmetastatic gallbladder carcinoma,status post chemo on 5 March by Dr.Sherrill,chronic anemia history of GI bleed, comes in for persistent right quadrant abdominal pain associated with nausea and vomiting since 3-4 days. She was started on hydrocodone without much relief. she was referred to [facility] for admission for pain control. She was started on dilaudid , but she was pretty much sedated from it and not eating or drinking.Oncology had seen her and provided recommendations. Found to be in acute kidney injury because of dehydration. Palliative care has been consulted for pain management. Patient looked lethargic this morning.  She was also hypoglycemic and her ammonia level was high. She progressively became hypotensive. Family made her DNR/DNI and showed interest on trasitioning her care to comfort care. She expired on 2017/12/15 at 23:30.   Final Diagnoses:  1.  Metastatic gallbladder carcinoma   The results of significant diagnostics from this hospitalization (including imaging, microbiology, ancillary and laboratory) are listed below for reference.    Significant Diagnostic Studies: Dg Chest 2 View  Result Date: 12-12-17 CLINICAL DATA:  Shortness of breath.  Metastatic gallbladder cancer. EXAM: CHEST - 2 VIEW COMPARISON:  11/02/2017 FINDINGS: Lateral view degraded by patient arm position and poor inspiratory effort. The frontal radiograph is also of low lung volumes. Port-A-Cath terminates at the low SVC. Right hemidiaphragm elevation. Midline trachea. Cardiomegaly accentuated by AP portable technique. No pleural effusion or pneumothorax. Interstitial prominence is favored to be due to AP portable technique and  low lung volumes. Mild right infrahilar and medial lower lobe atelectasis. No free intraperitoneal air. IMPRESSION: Low lung volumes with mild right-sided volume loss and atelectasis. Electronically Signed   By: Abigail Miyamoto M.D.   On: 12-12-17 11:10   US Abdomen Complete  Result Date: 11/02/2017 CLINICAL DATA:  Gallbladder carcinoma with liver metastases EXAM: ABDOMEN ULTRASOUND COMPLETE COMPARISON:  CT abdomen and pelvis October 26, 2017 FINDINGS: Gallbladder: There is a mass in the gallbladder fossa region measuring approximately 10 x 9 x 8 cm. There are areas of shadowing from this area, likely gallstones in trapped within the gallbladder mass. No pericholecystic fluid. Common bile duct: Diameter: 10 mm. Note that there is a stent within the biliary ductal system. Liver: There is diffuse intrahepatic biliary duct dilatation. Multiple liver masses are noted throughout the liver consistent with widespread metastatic disease. Largest individual mass measures approximately 5 x 3 cm. Portal vein is patent on color Doppler imaging with normal direction of blood flow towards the liver. IVC: No abnormality visualized. Pancreas: There is a mass at the head of the pancreas region measuring 3.0 x 2.2 x 3.2 cm. Spleen: Size and appearance within normal limits. Right Kidney: Length: 11.2 cm. Echogenicity is increased. There is relative renal cortical thinning. No mass or hydronephrosis visualized. Left Kidney: Length: 14.7 cm. Echogenicity within normal limits. No mass or hydronephrosis visualized. There is a calculus in the lower pole left kidney measuring 1.9 cm in length. There is trace perinephric fluid on the left. Abdominal aorta: No aneurysm visualized. Other findings: None. IMPRESSION: 1. Large mass arising in the gallbladder fossa felt to represent gallbladder carcinoma. Calculi are noted within this mass. 2. Biliary duct stent in place. Extensive intrahepatic biliary duct dilatation present. 3. Multiple  liver masses consistent with metastases  scattered throughout the liver. 4. Mass at level of head of pancreas measuring 3.0 x 2.2 x 3.2 cm. Question adenopathy versus mass arising from the pancreatic head. 5. Right kidney shows increased echogenicity and decrease in cortical thickness, likely due to a degree of atrophy. No obstructing focus right kidney. 6. Nonobstructing 1.9 cm calculus lower pole left kidney. Minimal perinephric fluid on the left. No obstructing focus left kidney. Electronically Signed   By: Lowella Grip III M.D.   On: 11/02/2017 08:11   Ir US Guide Vasc Access Right  Result Date: 10/29/2017 INDICATION: 62 year old female with gallbladder adenocarcinoma. She requires durable venous access for chemotherapy. EXAM: IMPLANTED PORT A CATH PLACEMENT WITH ULTRASOUND AND FLUOROSCOPIC GUIDANCE MEDICATIONS: 2 g Ancef; The antibiotic was administered within an appropriate time interval prior to skin puncture. ANESTHESIA/SEDATION: Versed 1.5 mg IV; Fentanyl 75 mcg IV; Moderate Sedation Time:  28 minutes The patient was continuously monitored during the procedure by the interventional radiology nurse under my direct supervision. FLUOROSCOPY TIME:  4 minutes, 0 seconds (59 mGy) COMPLICATIONS: None immediate. PROCEDURE: The right neck and chest was prepped with chlorhexidine, and draped in the usual sterile fashion using maximum barrier technique (cap and mask, sterile gown, sterile gloves, large sterile sheet, hand hygiene and cutaneous antiseptic). Antibiotic prophylaxis was provided with 2g Ancef administered IV one hour prior to skin incision. Local anesthesia was attained by infiltration with 1% lidocaine with epinephrine. Ultrasound demonstrated patency of the right internal jugular vein, and this was documented with an image. Under real-time ultrasound guidance, this vein was accessed with a 21 gauge micropuncture needle and image documentation was performed. A small dermatotomy was made at the  access site with an 11 scalpel. A 0.018" wire was advanced into the SVC and the access needle exchanged for a 83F micropuncture vascular sheath. The 0.018" wire was then removed and a 0.035" wire advanced into the IVC. An appropriate location for the subcutaneous reservoir was selected below the clavicle and an incision was made through the skin and underlying soft tissues. The subcutaneous tissues were then dissected using a combination of blunt and sharp surgical technique and a pocket was formed. A single lumen power injectable portacatheter was then tunneled through the subcutaneous tissues from the pocket to the dermatotomy and the port reservoir placed within the subcutaneous pocket. The venous access site was then serially dilated and a peel away vascular sheath placed over the wire. The wire was removed and the port catheter advanced into position under fluoroscopic guidance. The catheter tip is positioned in the upper right atrium. This was documented with a spot image. The portacatheter was then tested and found to flush and aspirate well. The port was flushed with saline followed by 100 units/mL heparinized saline. The pocket was then closed in two layers using first subdermal inverted interrupted absorbable sutures followed by a running subcuticular suture. The epidermis was then sealed with Dermabond. The dermatotomy at the venous access site was also sealed with Dermabond. IMPRESSION: Successful placement of a right IJ approach Power Port with ultrasound and fluoroscopic guidance. The catheter is ready for use. Signed, Criselda Peaches, MD [facility] Radiology Electronically Signed   By: Jacqulynn Cadet M.D.   On: 10/29/2017 12:37   Dg Chest Port 1 View  Result Date: 11/02/2017 CLINICAL DATA:  Sepsis EXAM: PORTABLE CHEST 1 VIEW COMPARISON:  10/17/2017 FINDINGS: Right Port-A-Cath in place with the tip in the upper right atrium. Cardiomegaly. No  confluent airspace  opacities or effusions. No acute bony abnormality. IMPRESSION: Cardiomegaly.  No active disease. Electronically Signed   By: Rolm Baptise M.D.   On: 11/02/2017 07:10   Dg Ercp Biliary & Pancreatic Ducts  Result Date: 10/30/2017 CLINICAL DATA:  Sphincterotomy; cholangiogram and biliary stent placement. History of gallbladder adenocarcinoma. EXAM: ERCP TECHNIQUE: Multiple spot images obtained with the fluoroscopic device and submitted for interpretation post-procedure. COMPARISON:  CT abdomen and pelvis - 10/26/2017; MRCP - 10/14/2017 FINDINGS: 7 spot fluoroscopic images of the right upper abdominal quadrant during ERCP are provided for review. Initial image demonstrates a ERCP probe overlying the right upper abdominal quadrant. There is selective cannulation and opacification of the common bile duct with suspected malignant narrowing/occlusion of the biliary hilum. There is moderate to marked intrahepatic biliary dilatation within the opacified portions of the central aspect of the right intrahepatic biliary tree. There is no definitive opacification of the left intrahepatic biliary tree. There is an additional apparent internal plastic stent overlying the expected location of the pancreatic duct. Subsequent images demonstrate deployment of an internal biliary stent across the malignant narrowing of the biliary hilum from the intrahepatic right biliary tree to the level of the ampulla/duodenum. IMPRESSION: ERCP with apparent successful deployment of an internal biliary stent as detailed above. These images were submitted for radiologic interpretation only. Please see the procedural report for the amount of contrast and the fluoroscopy time utilized. Electronically Signed   By: Sandi Mariscal M.D.   On: 10/30/2017 14:31   Dg Abd 2 Views  Result Date: 11/05/2017 CLINICAL DATA:  Continued abdominal pain. Recent ERCP and stent placement a week ago. EXAM: ABDOMEN - 2 VIEW COMPARISON:  CT abdomen  pelvis dated October 26, 2017. FINDINGS: Common bile duct stent is grossly unchanged in position. The bowel gas pattern is normal. There is no evidence of free air. Calcified fibroids in the pelvis. Multiple large left renal calculi are again noted. Atherosclerotic vascular calcification with infrarenal abdominal aortic aneurysm again noted. No acute osseous abnormality. IMPRESSION: 1. No definite acute abnormality. Common bile duct stent is grossly unchanged in position. 2. Unchanged left nephrolithiasis. Electronically Signed   By: Titus Dubin M.D.   On: 11/05/2017 09:38   Ir Fluoro Guide Port Insertion Right  Result Date: 10/29/2017 INDICATION: 62 year old female with gallbladder adenocarcinoma. She requires durable venous access for chemotherapy. EXAM: IMPLANTED PORT A CATH PLACEMENT WITH ULTRASOUND AND FLUOROSCOPIC GUIDANCE MEDICATIONS: 2 g Ancef; The antibiotic was administered within an appropriate time interval prior to skin puncture. ANESTHESIA/SEDATION: Versed 1.5 mg IV; Fentanyl 75 mcg IV; Moderate Sedation Time:  28 minutes The patient was continuously monitored during the procedure by the interventional radiology nurse under my direct supervision. FLUOROSCOPY TIME:  4 minutes, 0 seconds (59 mGy) COMPLICATIONS: None immediate. PROCEDURE: The right neck and chest was prepped with chlorhexidine, and draped in the usual sterile fashion using maximum barrier technique (cap and mask, sterile gown, sterile gloves, large sterile sheet, hand hygiene and cutaneous antiseptic). Antibiotic prophylaxis was provided with 2g Ancef administered IV one hour prior to skin incision. Local anesthesia was attained by infiltration with 1% lidocaine with epinephrine. Ultrasound demonstrated patency of the right internal jugular vein, and this was documented with an image. Under real-time ultrasound guidance, this vein was accessed with a 21 gauge micropuncture needle and image documentation was performed. A small  dermatotomy was made at the access site with an 11 scalpel. A 0.018" wire was advanced into the SVC and the access needle exchanged for  a 42F micropuncture vascular sheath. The 0.018" wire was then removed and a 0.035" wire advanced into the IVC. An appropriate location for the subcutaneous reservoir was selected below the clavicle and an incision was made through the skin and underlying soft tissues. The subcutaneous tissues were then dissected using a combination of blunt and sharp surgical technique and a pocket was formed. A single lumen power injectable portacatheter was then tunneled through the subcutaneous tissues from the pocket to the dermatotomy and the port reservoir placed within the subcutaneous pocket. The venous access site was then serially dilated and a peel away vascular sheath placed over the wire. The wire was removed and the port catheter advanced into position under fluoroscopic guidance. The catheter tip is positioned in the upper right atrium. This was documented with a spot image. The portacatheter was then tested and found to flush and aspirate well. The port was flushed with saline followed by 100 units/mL heparinized saline. The pocket was then closed in two layers using first subdermal inverted interrupted absorbable sutures followed by a running subcuticular suture. The epidermis was then sealed with Dermabond. The dermatotomy at the venous access site was also sealed with Dermabond. IMPRESSION: Successful placement of a right IJ approach Power Port with ultrasound and fluoroscopic guidance. The catheter is ready for use. Signed, Criselda Peaches, MD [facility] Radiology Electronically Signed   By: Jacqulynn Cadet M.D.   On: 10/29/2017 12:37   US Abdomen Limited Ruq  Result Date: 11/19/2017 CLINICAL DATA:  Right upper quadrant abdominal pain. EXAM: ULTRASOUND ABDOMEN LIMITED RIGHT UPPER QUADRANT COMPARISON:  Ultrasound of  November 02, 2017. CT scan of October 26, 2017. FINDINGS: Gallbladder: 10.2 x 10.2 x 8.5 cm predominantly solid abnormality is seen in gallbladder fossa consistent with history of gallbladder carcinoma. Positive sonographic Murphy's sign is noted. Common bile duct: Diameter: 6.2 mm which is within normal limits. Stent is noted within the common bile duct. Liver: Multiple masses are noted throughout hepatic parenchyma consistent with metastatic disease, the largest measuring 4.0 x 3.9 x 3.6 cm. Mild intrahepatic biliary dilatation is noted. Doppler demonstrates no flow in portal vein concerning for thrombosis or occlusion. IMPRESSION: 10.2 cm predominantly solid mass seen in gallbladder fossa consistent with history of gallbladder carcinoma. Mild intrahepatic biliary dilatation is noted. Multiple hepatic masses are noted consistent with metastatic disease. No flow is noted in portal vein on Doppler, concerning for portal vein thrombosis or occlusion. These results will be called to the ordering clinician or representative by the Radiologist Assistant, and communication documented in the PACS or zVision Dashboard. Electronically Signed   By: Marijo Conception, M.D.   On: 11/19/2017 12:38    Microbiology: Recent Results (from the past 240 hour(s))  MRSA PCR Screening     Status: None   Collection Time: 11/29/2017  2:05 PM  Result Value Ref Range Status   MRSA by PCR NEGATIVE NEGATIVE Final    Comment:        The GeneXpert MRSA Assay (FDA approved for NASAL specimens only), is one component of a comprehensive MRSA colonization surveillance program. It is not intended to diagnose MRSA infection nor to guide or monitor treatment for MRSA infections. Performed at [facility], [place] [address], [place], [place] [address]      Labs: Basic Metabolic Panel: Recent Labs  Lab 11/12/2017 0424  NA 149*  K 3.9  CL 117*  CO2 21*  GLUCOSE 116*  BUN 68*  CREATININE 1.41*  CALCIUM 9.2    Liver Function Tests: Recent Labs  Lab 12/02/2017 0424  AST 106*  ALT 73*  ALKPHOS 215*  BILITOT 2.1*  PROT 6.1*  ALBUMIN 1.5*   No results for input(s): LIPASE, AMYLASE in the last 168 hours. Recent Labs  Lab 12/08/2017 1020  AMMONIA 76*   CBC: Recent Labs  Lab 11/24/2017 0424  WBC 11.2*  HGB 8.7*  HCT 27.2*  MCV 90.4  PLT 224   Cardiac Enzymes: No results for input(s): CKTOTAL, CKMB, CKMBINDEX, TROPONINI in the last 168 hours. D-Dimer No results for input(s): DDIMER in the last 72 hours. BNP: Invalid input(s): POCBNP CBG: Recent Labs  Lab 12/04/2017 1153 11/24/2017 1214 12/03/2017 1634 11/25/2017 1713 12/07/2017 1822  GLUCAP 49* 82 56* 100* 88   Anemia work up No results for input(s): VITAMINB12, FOLATE, FERRITIN, TIBC, IRON, RETICCTPCT in the last 72 hours. Urinalysis    Component Value Date/Time   COLORURINE AMBER (A) 11/20/2017 1032   APPEARANCEUR HAZY (A) 11/26/2017 1032   LABSPEC 1.016 12/02/2017 1032   PHURINE 5.0 11/23/2017 1032   GLUCOSEU 50 (A) 11/20/2017 1032   HGBUR NEGATIVE 12/02/2017 1032   BILIRUBINUR NEGATIVE 11/20/2017 1032   KETONESUR NEGATIVE 11/30/2017 1032   PROTEINUR 30 (A) 12/05/2017 1032   NITRITE NEGATIVE 12/01/2017 1032   LEUKOCYTESUR NEGATIVE 11/28/2017 1032   Sepsis Labs Invalid input(s): PROCALCITONIN,  WBC,  LACTICIDVEN     SIGNED:  Shelly Coss, MD  [facility] 11/27/2017, 5:50 PM Pager [phone]  If 7PM-7AM, please contact night-coverage [email or web address] Password [email or web address]

## 2017-12-10 DEATH — deceased

## 2018-09-23 IMAGING — DX DG ABDOMEN 2V
3 series · 3 of 3 positions shown · non-contrast
Comparison: CT abdomen pelvis dated October 26, 2017.

CLINICAL DATA: Continued abdominal pain. Recent ERCP and stent
placement a week ago.

EXAM:
ABDOMEN - 2 VIEW

[abdomen erect]
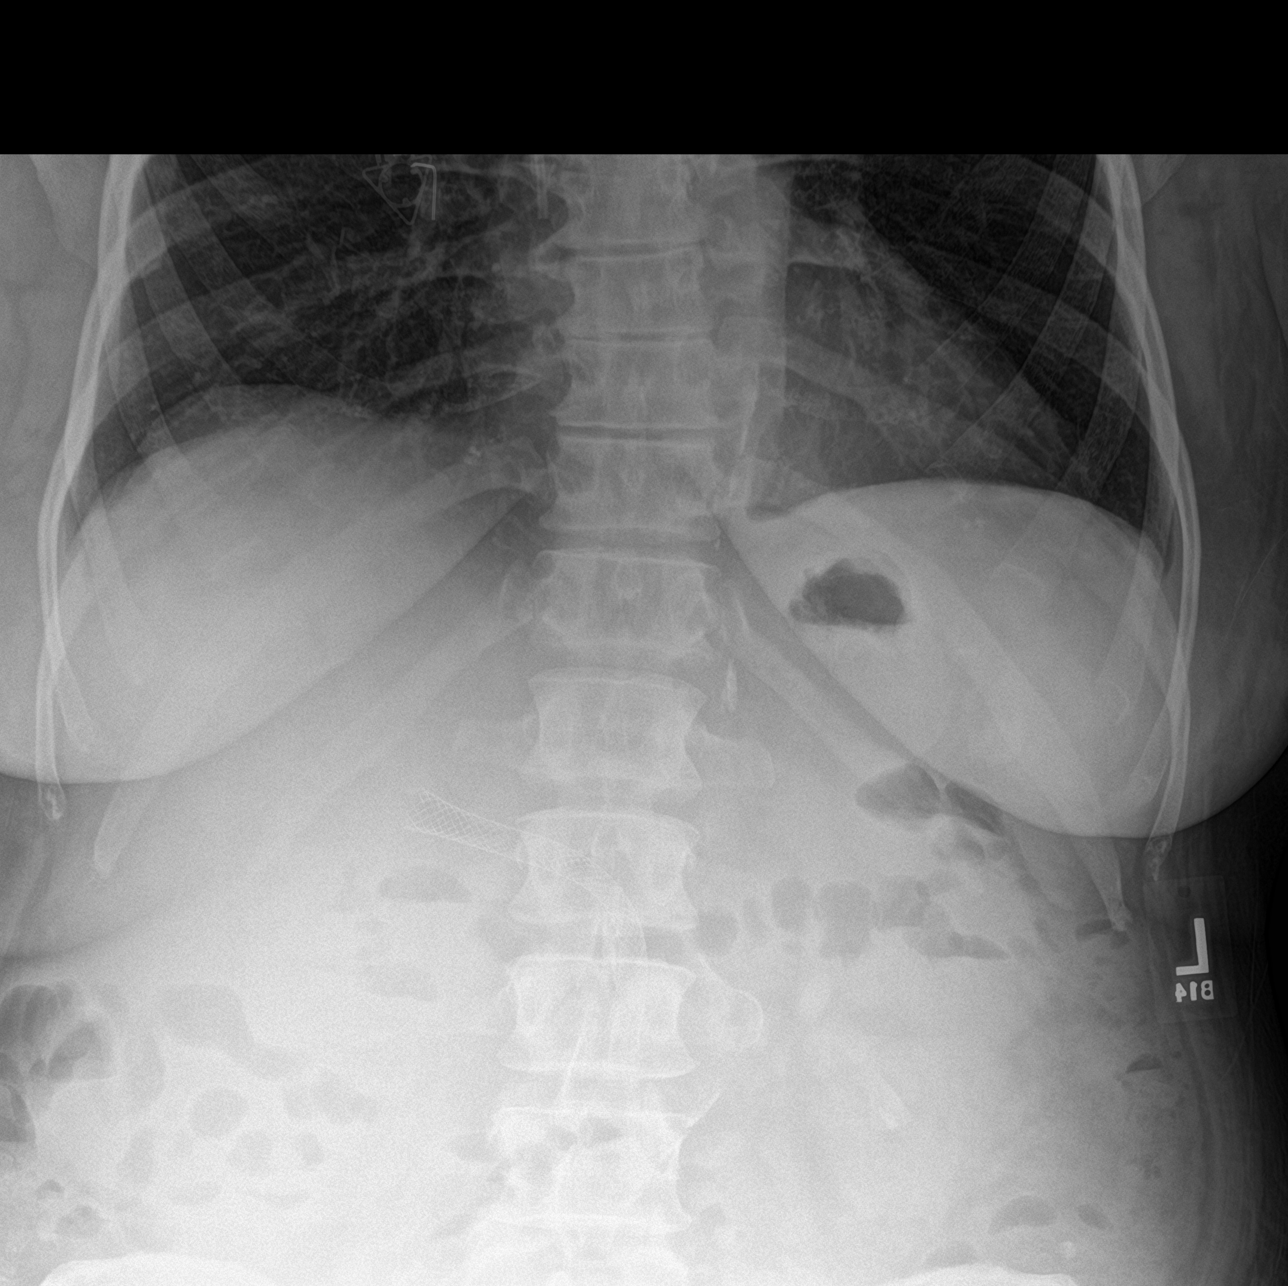

[abdomen supine (1 of 2)]
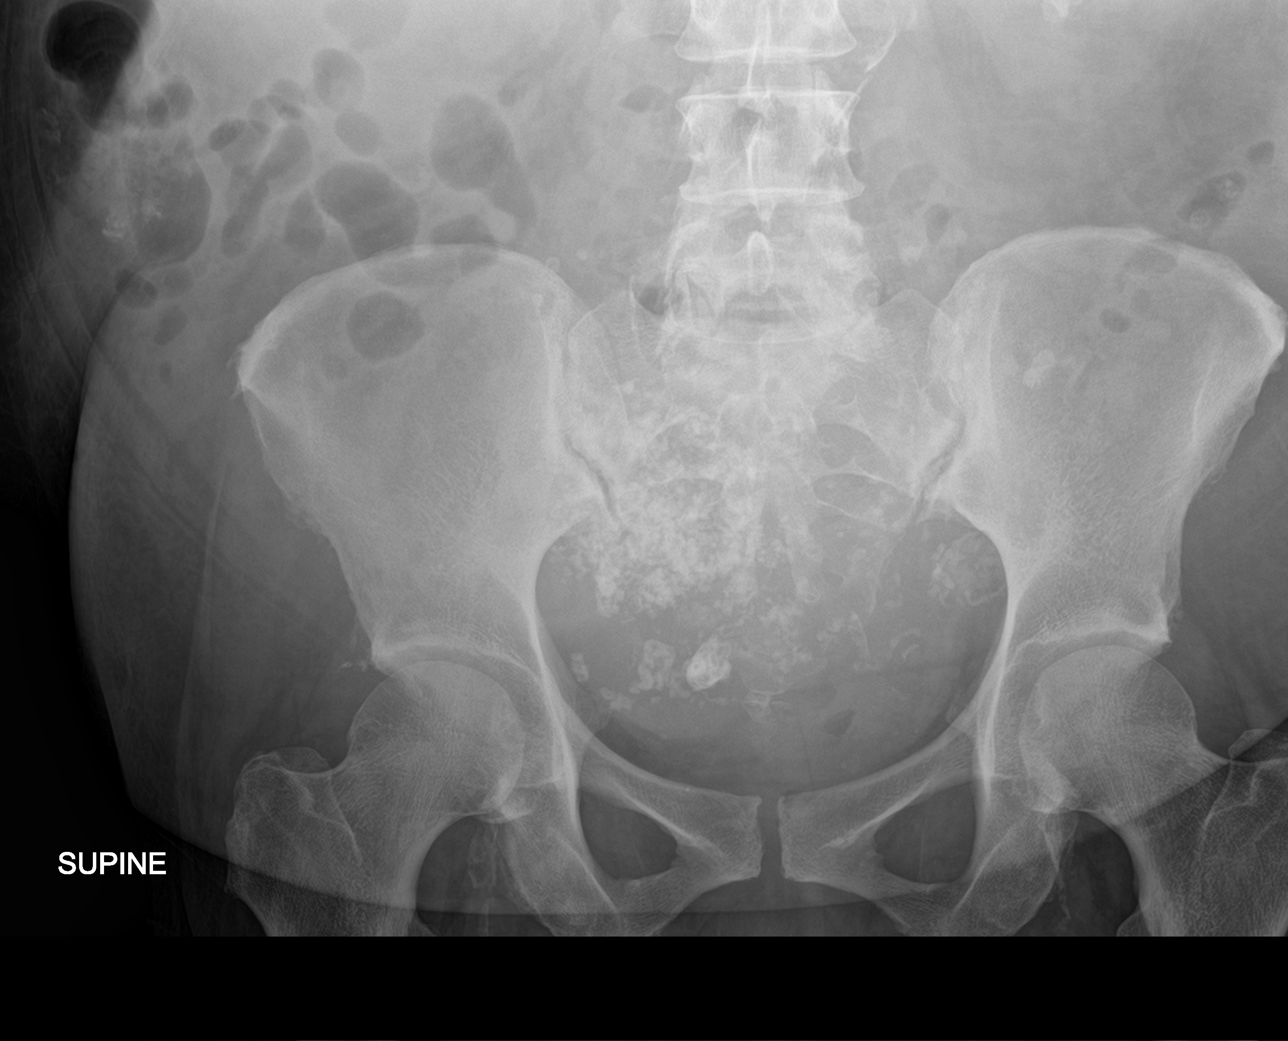

[abdomen supine (2 of 2)]
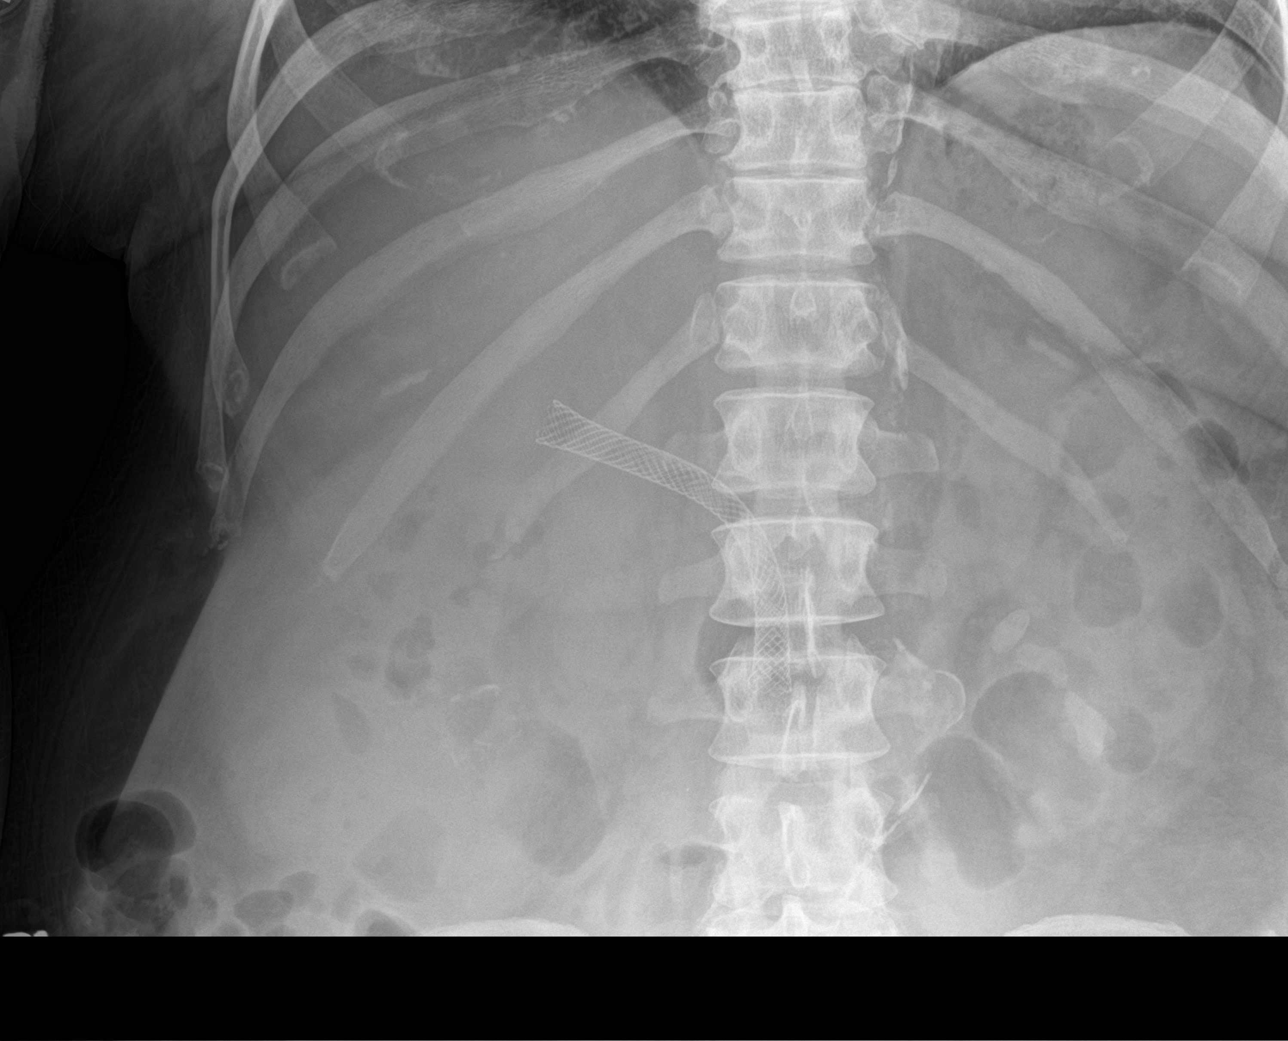

[3 of 3 positions shown; findings below may reference images not displayed]

FINDINGS: Common bile duct stent is grossly unchanged in position. The bowel
gas pattern is normal. There is no evidence of free air. Calcified
fibroids in the pelvis. Multiple large left renal calculi are again
noted. Atherosclerotic vascular calcification with infrarenal
abdominal aortic aneurysm again noted. No acute osseous abnormality.
IMPRESSION: 1. No definite acute abnormality. Common bile duct stent is grossly
unchanged in position.
2. Unchanged left nephrolithiasis.

## 2018-10-06 IMAGING — CR DG CHEST 2V
2 series · 2 of 2 positions shown · non-contrast
Comparison: 11/02/2017

CLINICAL DATA: Shortness of breath.  Metastatic gallbladder cancer.

EXAM:
CHEST - 2 VIEW

[w chest lat]
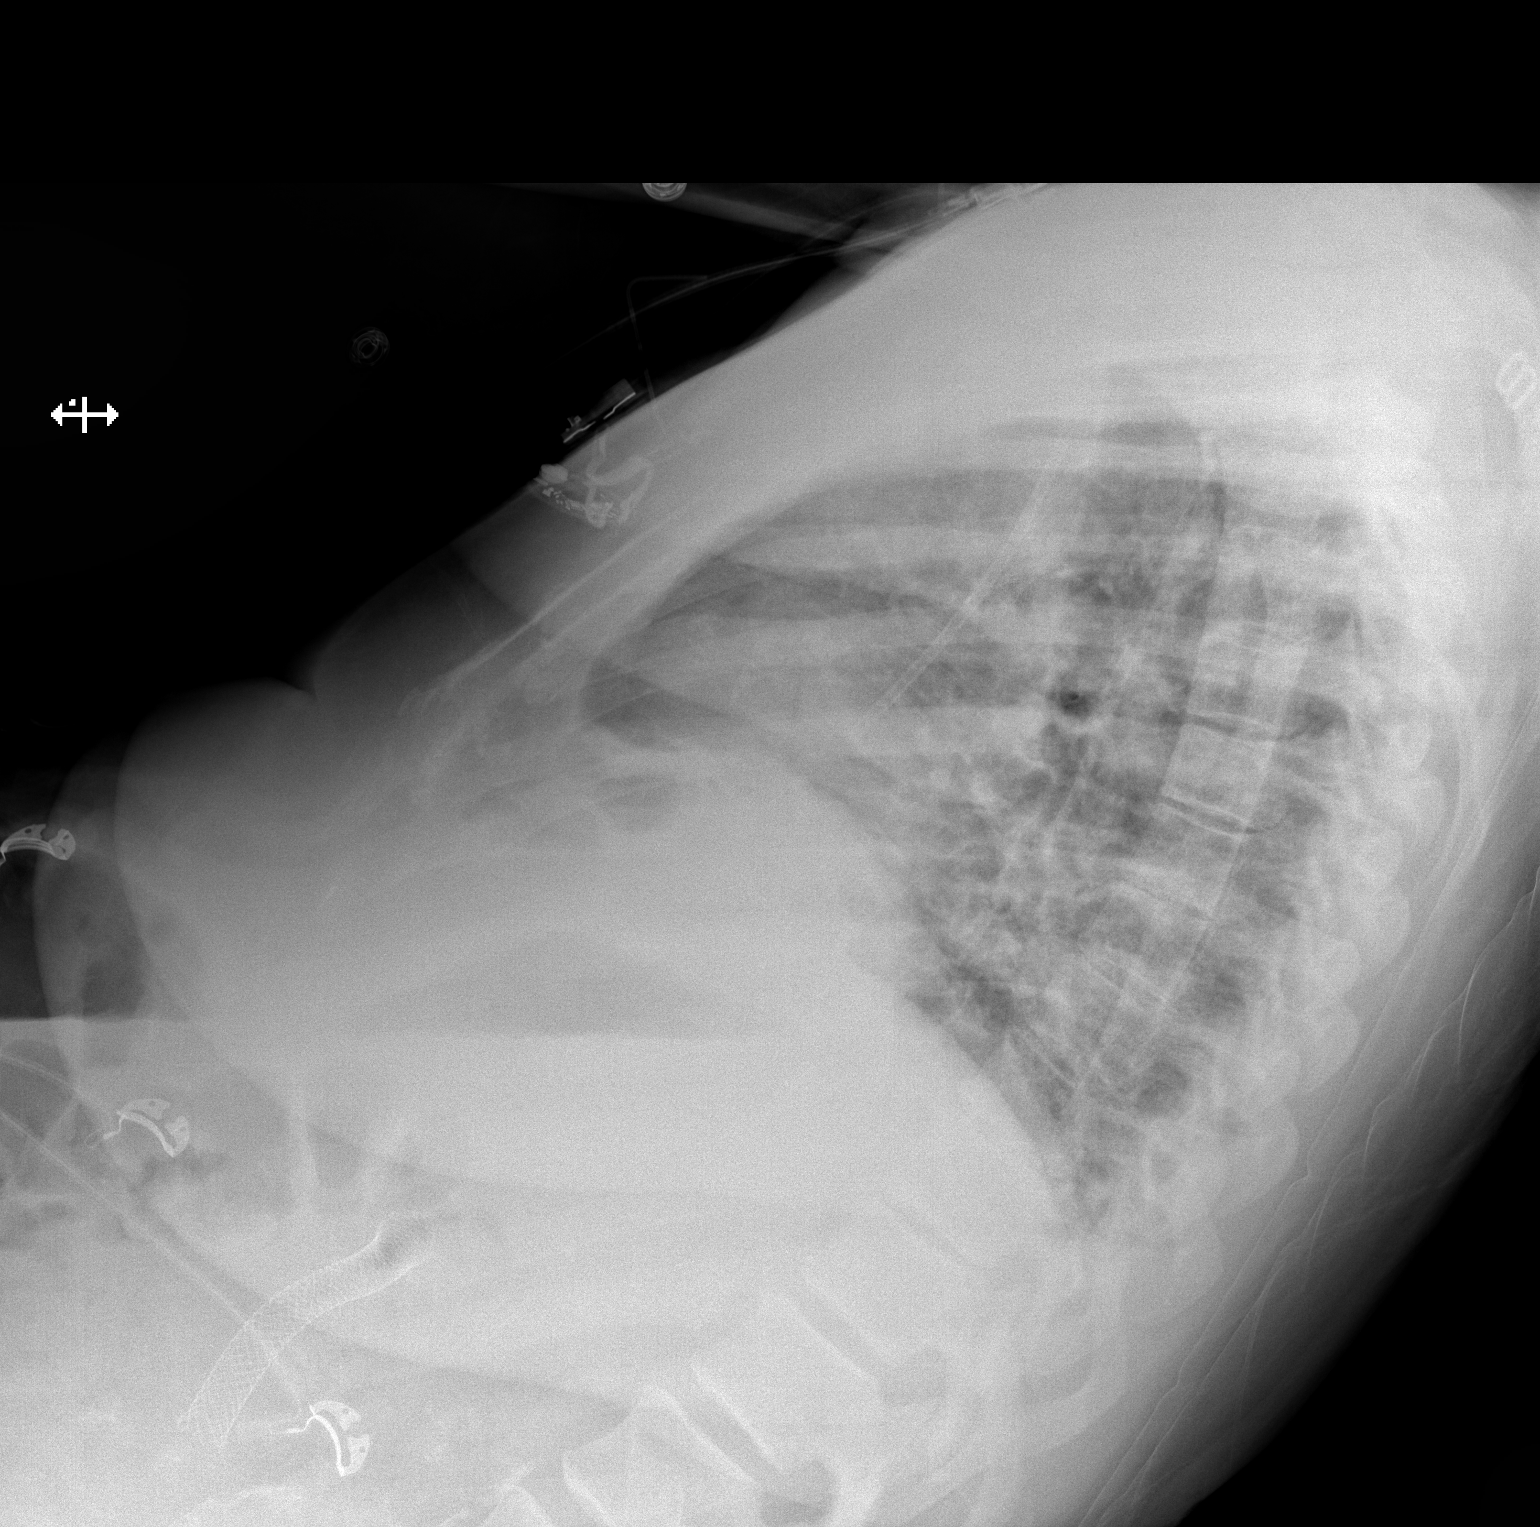

[x chest ap]
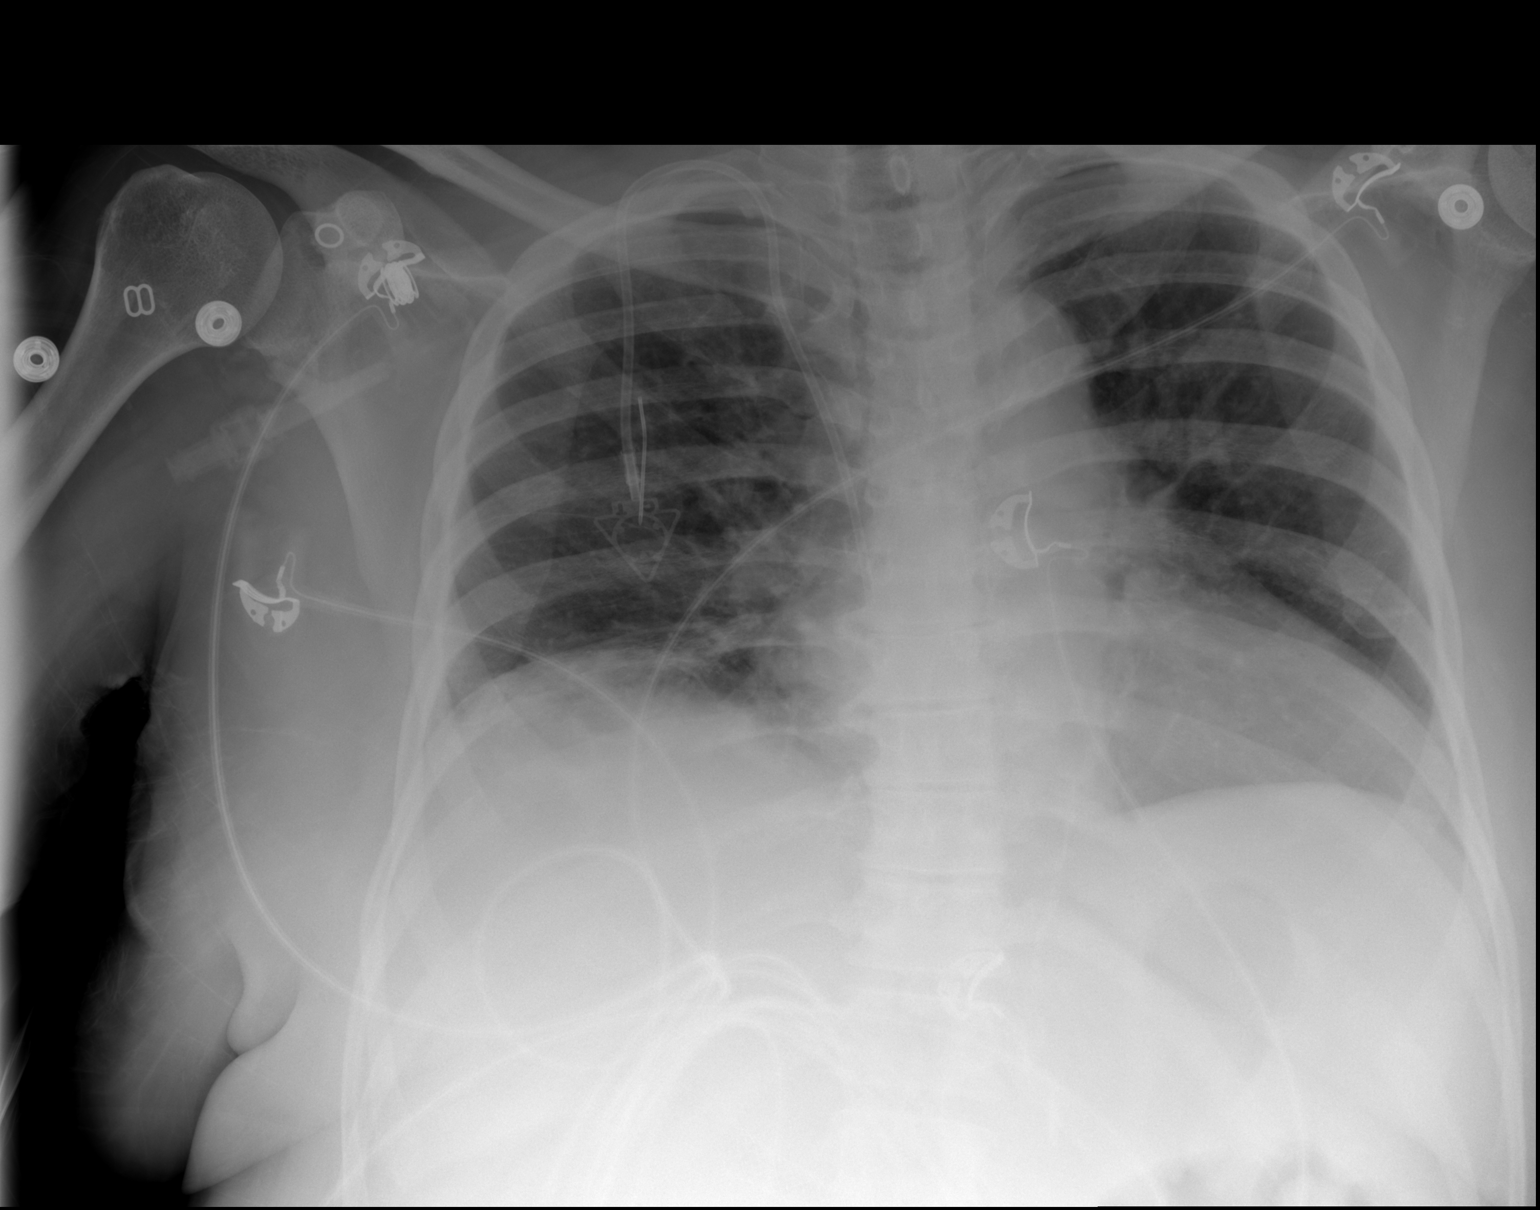

[2 of 2 positions shown; findings below may reference images not displayed]

FINDINGS: Lateral view degraded by patient arm position and poor inspiratory
effort. The frontal radiograph is also of low lung volumes.
Port-A-Cath terminates at the low SVC. Right hemidiaphragm
elevation. Midline trachea. Cardiomegaly accentuated by AP portable
technique. No pleural effusion or pneumothorax. Interstitial
prominence is favored to be due to AP portable technique and low
lung volumes. Mild right infrahilar and medial lower lobe
atelectasis. No free intraperitoneal air.
IMPRESSION: Low lung volumes with mild right-sided volume loss and atelectasis.
